# Patient Record
Sex: Female | Born: 1965
Health system: Southern US, Community
[De-identification: ages and names within clinical notes are randomized; demographics above are authoritative.]

## PROBLEM LIST (undated history)

## (undated) DIAGNOSIS — K219 Gastro-esophageal reflux disease without esophagitis: Secondary | ICD-10-CM

## (undated) DIAGNOSIS — E559 Vitamin D deficiency, unspecified: Secondary | ICD-10-CM

## (undated) DIAGNOSIS — G4730 Sleep apnea, unspecified: Secondary | ICD-10-CM

## (undated) DIAGNOSIS — R011 Cardiac murmur, unspecified: Secondary | ICD-10-CM

## (undated) DIAGNOSIS — I38 Endocarditis, valve unspecified: Secondary | ICD-10-CM

## (undated) DIAGNOSIS — K5792 Diverticulitis of intestine, part unspecified, without perforation or abscess without bleeding: Secondary | ICD-10-CM

## (undated) DIAGNOSIS — I10 Essential (primary) hypertension: Secondary | ICD-10-CM

## (undated) DIAGNOSIS — T7840XA Allergy, unspecified, initial encounter: Secondary | ICD-10-CM

## (undated) HISTORY — PX: ABDOMINAL HYSTERECTOMY: SHX81

## (undated) HISTORY — DX: Vitamin D deficiency, unspecified: E55.9

## (undated) HISTORY — DX: Essential (primary) hypertension: I10

## (undated) HISTORY — DX: Cardiac murmur, unspecified: R01.1

## (undated) HISTORY — DX: Sleep apnea, unspecified: G47.30

## (undated) HISTORY — DX: Diverticulitis of intestine, part unspecified, without perforation or abscess without bleeding: K57.92

## (undated) HISTORY — PX: CHOLECYSTECTOMY, LAPAROSCOPIC: SHX56

## (undated) HISTORY — PX: TUBAL LIGATION: SHX77

## (undated) HISTORY — DX: Endocarditis, valve unspecified: I38

## (undated) HISTORY — DX: Gastro-esophageal reflux disease without esophagitis: K21.9

## (undated) HISTORY — PX: CHOLECYSTECTOMY: SHX55

## (undated) HISTORY — DX: Allergy, unspecified, initial encounter: T78.40XA

---

## 1991-03-30 DIAGNOSIS — K219 Gastro-esophageal reflux disease without esophagitis: Secondary | ICD-10-CM

## 1991-03-30 DIAGNOSIS — I10 Essential (primary) hypertension: Secondary | ICD-10-CM

## 1991-03-30 HISTORY — DX: Essential (primary) hypertension: I10

## 1991-03-30 HISTORY — DX: Gastro-esophageal reflux disease without esophagitis: K21.9

## 1997-12-17 ENCOUNTER — Emergency Department (HOSPITAL_COMMUNITY): Admission: EM | Admit: 1997-12-17 | Discharge: 1997-12-17 | Payer: Self-pay | Admitting: Emergency Medicine

## 2002-10-04 ENCOUNTER — Encounter: Admission: RE | Admit: 2002-10-04 | Discharge: 2002-10-04 | Payer: Self-pay | Admitting: Family Medicine

## 2002-10-04 ENCOUNTER — Encounter: Payer: Self-pay | Admitting: Family Medicine

## 2002-12-06 ENCOUNTER — Ambulatory Visit (HOSPITAL_COMMUNITY): Admission: RE | Admit: 2002-12-06 | Discharge: 2002-12-06 | Payer: Self-pay | Admitting: Gynecology

## 2002-12-06 ENCOUNTER — Ambulatory Visit (HOSPITAL_BASED_OUTPATIENT_CLINIC_OR_DEPARTMENT_OTHER): Admission: RE | Admit: 2002-12-06 | Discharge: 2002-12-06 | Payer: Self-pay | Admitting: Gynecology

## 2002-12-06 ENCOUNTER — Encounter (INDEPENDENT_AMBULATORY_CARE_PROVIDER_SITE_OTHER): Payer: Self-pay

## 2002-12-21 ENCOUNTER — Encounter (INDEPENDENT_AMBULATORY_CARE_PROVIDER_SITE_OTHER): Payer: Self-pay | Admitting: Specialist

## 2002-12-21 ENCOUNTER — Ambulatory Visit (HOSPITAL_COMMUNITY): Admission: RE | Admit: 2002-12-21 | Discharge: 2002-12-21 | Payer: Self-pay | Admitting: Gastroenterology

## 2003-06-26 ENCOUNTER — Other Ambulatory Visit: Admission: RE | Admit: 2003-06-26 | Discharge: 2003-06-26 | Payer: Self-pay | Admitting: Gynecology

## 2003-07-10 ENCOUNTER — Encounter (INDEPENDENT_AMBULATORY_CARE_PROVIDER_SITE_OTHER): Payer: Self-pay | Admitting: Specialist

## 2003-07-10 ENCOUNTER — Observation Stay (HOSPITAL_COMMUNITY): Admission: RE | Admit: 2003-07-10 | Discharge: 2003-07-11 | Payer: Self-pay | Admitting: Gynecology

## 2003-09-06 ENCOUNTER — Encounter: Admission: RE | Admit: 2003-09-06 | Discharge: 2003-09-06 | Payer: Self-pay | Admitting: Family Medicine

## 2004-08-17 ENCOUNTER — Other Ambulatory Visit: Admission: RE | Admit: 2004-08-17 | Discharge: 2004-08-17 | Payer: Self-pay | Admitting: Gynecology

## 2004-11-13 ENCOUNTER — Encounter: Admission: RE | Admit: 2004-11-13 | Discharge: 2004-11-13 | Payer: Self-pay | Admitting: Family Medicine

## 2005-08-18 ENCOUNTER — Other Ambulatory Visit: Admission: RE | Admit: 2005-08-18 | Discharge: 2005-08-18 | Payer: Self-pay | Admitting: Gynecology

## 2005-11-06 ENCOUNTER — Inpatient Hospital Stay (HOSPITAL_COMMUNITY): Admission: EM | Admit: 2005-11-06 | Discharge: 2005-11-07 | Payer: Self-pay | Admitting: Emergency Medicine

## 2005-11-07 ENCOUNTER — Ambulatory Visit: Payer: Self-pay | Admitting: Psychiatry

## 2005-11-15 ENCOUNTER — Encounter: Admission: RE | Admit: 2005-11-15 | Discharge: 2005-11-15 | Payer: Self-pay | Admitting: Family Medicine

## 2006-08-25 ENCOUNTER — Other Ambulatory Visit: Admission: RE | Admit: 2006-08-25 | Discharge: 2006-08-25 | Payer: Self-pay | Admitting: *Deleted

## 2006-11-17 ENCOUNTER — Encounter: Admission: RE | Admit: 2006-11-17 | Discharge: 2006-11-17 | Payer: Self-pay | Admitting: Family Medicine

## 2009-03-29 DIAGNOSIS — E559 Vitamin D deficiency, unspecified: Secondary | ICD-10-CM

## 2009-03-29 HISTORY — DX: Vitamin D deficiency, unspecified: E55.9

## 2009-04-22 ENCOUNTER — Emergency Department (HOSPITAL_COMMUNITY): Admission: EM | Admit: 2009-04-22 | Discharge: 2009-04-22 | Payer: Self-pay | Admitting: Family Medicine

## 2009-10-21 ENCOUNTER — Encounter: Admission: RE | Admit: 2009-10-21 | Discharge: 2009-10-21 | Payer: Self-pay | Admitting: Family Medicine

## 2010-06-15 LAB — POCT RAPID STREP A (OFFICE): Streptococcus, Group A Screen (Direct): NEGATIVE

## 2010-08-14 NOTE — H&P (Signed)
NAME:  Amanda [name], Amanda [name] NO.:  [id number]   MEDICAL RECORD NO.:  [id number]          PATIENT TYPE:  EMS   LOCATION:  ED                           FACILITY:  [facility]   PHYSICIAN:  Hollice Espy, M.D.DATE OF BIRTH:  01-01-1966   DATE OF ADMISSION:  11/06/2005  DATE OF DISCHARGE:                                HISTORY & PHYSICAL   PRIMARY CARE PHYSICIAN:  She sees a Publishing rights manager at [place] but cannot  recall who the PCP is.   CHIEF COMPLAINT:  Overdose.   HISTORY OF PRESENT ILLNESS:  The patient is a 45 year old African-American  female with past medical history of hypertension and previous diagnosis of  depression but had never been on medicines for depression, who presents to  the emergency room after intentional overdose.  Apparently, things have been  stressful for the past 1-2 years ever since her husband committed suicide 2  years ago.  Since that time, she had noted especially in the last few months  things have really stressful, and her boyfriend and her have been getting  into more and more fights, and her 2 sons have been hanging with friends who  abuse drugs.  She finally could not take the stress anymore and then today  took an intentional overdose of her blood pressure medications, specifically  the patient took 15 Avapro and about 15-20 hydrochlorothiazides.  She called  a friend about an hour later and told her what had happened.  Her friend  rushed over, called the paramedics, and the patient was brought in to the  emergency room.  She says she took this at approximately 1 p.m. or so.  She  was given a charcoal suspension at approximately 5 p.m.  Poison Control was  called, and they recommended close monitoring of her vital signs but  otherwise, no other treatments were indicated.  The patient had lab work  drawn which showed normal white count.  Her urine drug screen alcohol level,  comprehensive metabolic panel are pending.  The patient denies  taking any  salicylate or Tylenol; however, none were ordered by the emergency room.  Currently, the patient is doing okay.  She is tearful about what happened  and stressed, but she denies any headache, vision changes, __________, chest  pain, palpitations, shortness of breath, wheeze, cough, abdominal pain,  hematuria, dysuria, constipation, diarrhea, focal extremity numbness,  weakness, or pain.   REVIEW OF SYSTEMS:  Otherwise negative.   PAST MEDICAL HISTORY:  1. Hypertension.  2. Previous diagnosis of depression.  3. She also has obesity.   MEDICATIONS:  1. Avapro 150 p.o. daily.  2. HCTZ 25 p.o. daily.   ALLERGIES:  None.   SOCIAL HISTORY:  She denies any tobacco, alcohol, or drug use.   FAMILY HISTORY:  Noncontributory other than a husband who killed himself 2  years ago.   PHYSICAL EXAMINATION:  VITAL SIGNS:  The patient's vitals on admission, temp  afebrile, heart rate 81, blood pressure 136/63, respirations 18, O2  saturations 99% on room air.  GENERAL:  The patient is alert and oriented x3.  No apparent distress.  HEENT:  Normocephalic, atraumatic.  Her mucous membranes are moist.  She has  no carotid bruits.  HEART:  Regular rate and rhythm, S1, S2.  LUNGS:  Clear to auscultation bilaterally.  ABDOMEN:  Soft, nontender, obese, positive bowel sounds.  EXTREMITIES:  No cyanosis, clubbing, or edema.   LABORATORY DATA:  White count 9.8, __________12.6, 36.9, MCV 91, platelet  count 314, 81% neutrophils.  Urine drug screen, comprehensive metabolic  panel, and alcohol level are pending at this time.  I have ordered myself a  Tylenol level and salicylate level.   ASSESSMENT/PLAN:  1. An intentional overdose of blood pressure medications.  If all other      lab work comes back normal, we will plan to keep the patient in the ICU      with continuous monitoring of her blood pressure closely.  I have also      contacted Behavioral Health for a psychiatric assessment.   2. Hypertension.  At this time, we will hold until medications have      cleared her system.  Once her blood pressure starts to elevate, we will      resume her medications.      Hollice Espy, M.D.  Electronically Signed     SKK/MEDQ  D:  11/06/2005  T:  11/06/2005  Job:  [id number]   cc:   Behavioral [facility]

## 2010-08-14 NOTE — Op Note (Signed)
NAME:  SEMAJA, LYMON                    ACCOUNT NO.:  [id number]   MEDICAL RECORD NO.:  [id number]                   PATIENT TYPE:  AMB   LOCATION:  NESC                                 FACILITY:  [facility]   PHYSICIAN:  Ivor Costa. Farrel Gobble, M.D.              DATE OF BIRTH:  Aug 29, 1965   DATE OF PROCEDURE:  12/06/2002  DATE OF DISCHARGE:                                 OPERATIVE REPORT   PREOPERATIVE DIAGNOSES:  1. Questionable endometrial polyp.  2. Menorrhagia.  3. Anemia.   POSTOPERATIVE DIAGNOSES:  1. Questionable endometrial polyp.  2. Menorrhagia.  3. Anemia.   PROCEDURE:  D&C/hysteroscopy.   SURGEON:  Ivor Costa. Farrel Gobble, M.D.   ANESTHESIA:  MAC with a paracervical block of 0.25% Marcaine.   INTAKE AND OUTPUT DEFICIT:  30% sorbitol solution was 30 mL.   ESTIMATED BLOOD LOSS:  Minimal.   FINDINGS:  Normal cavity contours. The uterus sounded to 9 cm.   PATHOLOGY:  Endometrial curettings.   DESCRIPTION OF PROCEDURE:  The patient was taken to the operating room,  placed in the dorsal lithotomy position, prepped and draped in the usual  sterile fashion after IV sedation was induced. A bivalve speculum was then  placed in the vagina, the cervix was visualized and 10 mL of 0.25% Marcaine  solution was injected circumferentially around the cervix after which the  speculum was removed and bimanual examination was performed. The orientation  of the uterus was confirmed. A sterile weighted speculum was then placed in  the vagina as was the ______.  The cervix was stabilized with a single tooth  tenaculum and at 3 and 9 o'clock were injected with a total of 10 mL of a  dilute Pitressin solution of 10 and 50. The uterine sound was placed, the  orientation of the canal was confirmed. The cervix was then gently dilated  up to 41 [place] with ease. The operative hysteroscope was advanced then  through the cervix. Inspection of the cavity showed normal secretory  appearing  endometrium. The questionable defect was on the posterior wall  therefore several passes were taken from this area. There was nothing that  felt firm and distinct consistent with a fibroid; however, the tubal ostia  were visualized and unremarkable. The hysteroscope was removed, curettage of  the entire cavity was then performed and sent for pathology. Replacement of  the hysteroscope confirmed that the areas of the previous biopsy were  hemostatic and again no polyp or fibroid  was revealed after curettage. The instruments were then removed from the  vagina, the cervix was noted to be hemostatic. The patient tolerated the  procedure well. Sponge, sharp and needle counts were correct x2 and she was  transferred to the PACU in stable condition.  Ivor Costa. Farrel Gobble, M.D.    Leda Roys  D:  12/06/2002  T:  12/06/2002  Job:  [id number]

## 2010-08-14 NOTE — H&P (Signed)
NAME:  Amanda [name], Amanda [name]                    ACCOUNT NO.:  [id number]   MEDICAL RECORD NO.:  [id number]                   PATIENT TYPE:  INP   LOCATION:  NA                                   FACILITY:  [facility]   PHYSICIAN:  Ivor Costa. Farrel Gobble, M.D.              DATE OF BIRTH:  07/20/1965   DATE OF ADMISSION:  07/10/2003  DATE OF DISCHARGE:                                HISTORY & PHYSICAL   HISTORY OF PRESENT ILLNESS:  The patient is a 45 year old G2, P2 with a  history of dysfunctional bleeding and as a result thereof severe anemia.  The patient underwent a GYN ultrasound which showed questionable submucosal  fibroid versus an endometrial polyp and as a result thereof underwent a D&C  hysteroscopy which came back with benign tissue.  The patient continued to  bleed.  We therefore went ahead and placed a Mirena IUD in December 2004.  The patient ultimately came back in February 2005 to have her IUD removed.  She states initially she had done well on her IUD however, her last cycle on  the IUD had lasted 12 days in which she had bled very heavily with a large  amount of clots and she felt that the IUD may have been coming out as well.  She therefore would like to proceed with more definitive surgery in the form  of a vaginal hysterectomy.  The patient states that prior to all of this her  periods have been increasingly heavy for the past several months. She had an  ultrasound done in July 2004 which showed her uterus to be 11.5 x 6.4 x 8.1.  She was noted to be severely anemic with a hemoglobin of 9 and iron studies  consistent with iron deficiency.  The patient denies any postcoital  bleeding.  Her Pap smears have all been normal.  From a past OB-GYN  perspective, she is contracepting with a tubal ligation, she has had two  vaginal deliveries complicated only by hypertension, her cycles are 28 days.   PAST MEDICAL HISTORY:  Past medical history is significant for chronic  hypertension and  a stomach ulcer.   SURGICAL HISTORY:  She had a D&C hysteroscopy in September 2004, she had a  cholecystectomy in 1992 and a tubal ligation in 1991.   MEDICATIONS:  The patient is on Avapro 150 mg a day and iron  supplementation.   SOCIAL HISTORY:  She is married.  No alcohol, tobacco, or caffeine.   ALLERGIES:  Negative.   FAMILY HISTORY:  Family history is significant for breast cancer in sister  in early 23s.  She has had two sisters both of which have had fibroids.  There is no family history of ovarian, uterine, or colon cancer.   PHYSICAL EXAMINATION:  She is well appearing in no acute distress.  Her  heart is regular rate.  Her lungs are clear to auscultation.  Her abdomen is  soft,  nontender, without rebound or guarding.  On speculum exam she has  normal external female genitalia, the BUS is negative, the vagina is pink  and moist, the cervix is parous.  Bimanual exam:  The uterus is anteverted,  mobile, and nontender.  The adnexa without tenderness or fullness.   LABORATORIES:  She had a TSH and prolactin both of which were normal.  She  had a hemoglobin of 9 with a hematocrit of 27 and normal platelets.  Iron  studies consistent with iron-deficiency anemia.   ASSESSMENT:  Dysfunctional bleeding failed conservative management.   PLAN:  Patient will present for a vaginal hysterectomy with preservation of  her ovaries which is scheduled for the afternoon of July 10, 2003.                                               Ivor Costa. Farrel Gobble, M.D.    THL/MEDQ  D:  07/09/2003  T:  07/09/2003  Job:  [id number]

## 2010-08-14 NOTE — Op Note (Signed)
   NAME:  Amanda [name], Amanda [name]                    ACCOUNT NO.:  [id number]   MEDICAL RECORD NO.:  [id number]                   PATIENT TYPE:  AMB   LOCATION:  ENDO                                 FACILITY:  [facility]   PHYSICIAN:  Graylin Shiver, M.D.                DATE OF BIRTH:  [birth year]   DATE OF PROCEDURE:  12/21/2002  DATE OF DISCHARGE:                                 OPERATIVE REPORT   PROCEDURE:  Esophagogastroduodenoscopy with biopsy.   INDICATIONS FOR PROCEDURE:  Heartburn, anemia, heme-positive stool, recent  finding of H. pylori which was treated.   Informed consent was obtained after explanation of the risks of bleeding,  infection, and perforation.   PREMEDICATIONS:  1. Fentanyl 75 mcg IV.  2. Versed 6 mg IV.   DESCRIPTION OF PROCEDURE:  With the patient in the left lateral decubitus  position, the Olympus gastroscope was inserted into the oropharynx and  passed into the esophagus.  It was advanced down the esophagus, then into  the stomach and into the duodenum.  The second portion and bulb of the  duodenum looked normal.  The stomach showed some mild erythema compatible  with gastritis.  No ulcers or erosions were seen.  No lesions were seen in  the fundus or cardia.  Biopsies were obtained from the distal and mid  stomach for histological inspection and to look for evidence of Helicobacter  pylori.  The esophagus looked normal.  The esophagogastric junction was at  35 cm.  She tolerated the procedure well without complications.   IMPRESSION:  Mild nonerosive gastritis.   PLAN:  The biopsies will be checked.  I see nothing specifically on this  examination to explain anemia or positive stool.                                               Graylin Shiver, M.D.    SFG/MEDQ  D:  12/21/2002  T:  12/22/2002  Job:  [id number]   cc:   Chales Salmon. Abigail Miyamoto, M.D.  [address]  [place]  [place] [address]  Fax: [phone]

## 2010-08-14 NOTE — Op Note (Signed)
NAME:  Amanda [name], Amanda [name]                    ACCOUNT NO.:  [id number]   MEDICAL RECORD NO.:  [id number]                   PATIENT TYPE:  OBV   LOCATION:  9399                                 FACILITY:  [facility]   PHYSICIAN:  Ivor Costa. Farrel Gobble, M.D.              DATE OF BIRTH:  05/10/1965   DATE OF PROCEDURE:  07/10/2003  DATE OF DISCHARGE:                                 OPERATIVE REPORT   PREOPERATIVE DIAGNOSES:  1. Dysfunctional uterine bleeding.  2. Anemia.   POSTOPERATIVE DIAGNOSES:  1. Dysfunctional uterine bleeding.  2. Anemia.   PROCEDURE:  Total vaginal hysterectomy.   SURGEON:  Ivor Costa. Farrel Gobble, M.D.   ASSISTANT:  Gaetano Hawthorne. Lily Peer, M.D.   ANESTHESIA:  General anesthesia.   IV FLUIDS:  1700 mL lactated Ringer's.   ESTIMATED BLOOD LOSS:  Less than 100 mL.   URINE OUTPUT:  Not available.   FINDINGS:  The uterus was globular, soft, consistent with adenomyosis.  The  weight was 261 g.  The tubes and ovaries were unremarkable.   COMPLICATIONS:  None.   PATHOLOGY:  Uterus and cervix.   DESCRIPTION OF PROCEDURE:  The patient was taken to the operating room where  general anesthesia was induced and placed in the dorsal lithotomy position,  prepped and draped in the usual sterile fashion with careful attention to  the flexion of the legs onto the anterior thighs.  She was in and out  catheterized at the beginning of the case.  A weighted speculum was placed  in the vagina as was the Sims.  The cervix was stabilized with a single-  tooth tenaculum and the vaginal mucosa was injected with a 2% lidocaine with  epinephrine.  The vaginal mucosa was then scored circumferentially.  The  cervix was deviated cephalad and the posterior colpotomy was performed.  The  incision was extended and the long sterile weighted speculum was then placed  in the posterior cul-de-sac.  the vaginal mucosa was sharply dissected off  anteriorly and laterally.  The uterosacrals were then  transected and suture  ligated with 0 Vicryl.  The cardinal ligaments were then transected and  suture ligated again with 0 Vicryl.  The dissection carried through until an  anterior colpotomy was able to be performed.  Placement of the retractors to  protect the bladder and the uterine vessels were then transected and suture  ligated.  The procedure was carried through up until we were able to safely  deliver the fundus.  The uterine fundus needed to be morcellated.  Portions  of the fundus and cervix were then sharply dissected off in order to give  her better visualization of the round and tubo-ovarian ligaments.  These  were then transected bilaterally and the remainder of the specimen was  passed off the field.  Those pedicles were then treated with 0 Vicryl and  the Heaney stitch followed by a free tie and were also held for  later  identification.  The lap sponge was placed in the vagina.  The pelvis was  irrigated with copious amounts of saline.  Inspection of pedicles assures  hemostasis.  There was a small amount of bleeding on the posterior cuff.  It  was felt to be safely obtained with closure of the peritoneum posteriorly.  The posterior vagina was plicated to the peritoneum from uterosacral to  uterosacral.  Reinspection of the area of bleeding assures of hemostasis.  The reinspection of the upper pedicles assured [place] also of hemostasis.  These  were then cut.  The McCall's culdoplasty stitch was then placed with 2-0  Vicryl.  The anterior/posterior vagina was plicated in the midline.  The  posterior colpotomy stitch was then tied.  The sutures were cut.  The vagina  was irrigated.  It was noted to be hemostatic.  The Foley was placed at the  end of the procedure which showed copious amounts of clear urine.                                               Ivor Costa. Farrel Gobble, M.D.    THL/MEDQ  D:  07/10/2003  T:  07/11/2003  Job:  [id number]

## 2010-08-14 NOTE — Op Note (Signed)
   NAME:  Amanda [name], Amanda [name]                    ACCOUNT NO.:  [id number]   MEDICAL RECORD NO.:  [id number]                   PATIENT TYPE:  AMB   LOCATION:  ENDO                                 FACILITY:  [facility]   PHYSICIAN:  Graylin Shiver, M.D.                DATE OF BIRTH:  1965-12-31   DATE OF PROCEDURE:  12/21/2002  DATE OF DISCHARGE:                                 OPERATIVE REPORT   PROCEDURE:  Colonoscopy.   INDICATION FOR PROCEDURE:  Heme-positive stool, anemia.   Informed consent was obtained after explanation of the risks of bleeding,  infection, and perforation.   PREMEDICATIONS:  The procedure was done immediately after an EGD with an  additional 25 mcg of fentanyl and 2 mg of Versed given IV.   DESCRIPTION OF PROCEDURE:  With the patient in the left lateral decubitus  position, a rectal exam was performed, and no masses were felt.  The Olympus  colonoscope was inserted into the rectum and advanced around the colon to  the cecum.  The cecal landmarks were identified.  The cecum and ascending  colon were normal.  The transverse colon was normal.  The descending colon,  sigmoid, and rectum were normal.  She tolerated the procedure well without  complications.   IMPRESSION:  Normal colonoscopy to the cecum.   There is nothing on this exam to explain heme-positive stool or anemia.   The patient will follow up with her primary care physician, Chales Salmon.  Abigail Miyamoto, M.D.                                               Graylin Shiver, M.D.    SFG/MEDQ  D:  12/21/2002  T:  12/22/2002  Job:  [id number]   cc:   Chales Salmon. Abigail Miyamoto, M.D.  [address]  [place]  [place] [address]  Fax: [phone]

## 2010-08-14 NOTE — H&P (Signed)
NAME:  Amanda [name], COURTER                    ACCOUNT NO.:  [id number]   MEDICAL RECORD NO.:  [id number]                   PATIENT TYPE:  AMB   LOCATION:  NESC                                 FACILITY:  [facility]   PHYSICIAN:  Ivor Costa. Farrel Gobble, M.D.              DATE OF BIRTH:  [birth year]   DATE OF ADMISSION:  DATE OF DISCHARGE:                                HISTORY & PHYSICAL   PREOPERATIVE HISTORY AND PHYSICAL:   CHIEF COMPLAINT:  Dysfunctional uterine bleeding with questionable  endometrial polyp.   HISTORY OF PRESENT ILLNESS:  The patient is a 45 year old G2, P2,  contracepting with a tubal ligation with about a 65-month history of  menorrhagia.  The patient states she currently flows now 7-8 sometimes even  9 days of the month.  She has noticed increasing amount of clots and  increasing duration of flow.  The patient is without any postcoital  bleeding.  Her Pap smears have been normal.  The patient had an ultrasound  performed that showed her uterus to be uniformly enlarged at 12.2 x 5.6 x  7.7 cm without any distinct fibroids.  A sonohistogram was performed which  showed an echogenic focus in the posterior wall that measures 10 x 7 which  could be questionably a submucous myoma versus an endometrial polyp.  The  patient elects to undergo a D&C hysteroscopy.   PAST OBSTETRICAL-GYNECOLOGICAL HISTORY:  Significant for menses as above  contracepting with a tubal ligation, she has had two vaginal deliveries  complicated only by hypertension, her cycles are every 28 days, her Pap  smear was normal.   PAST MEDICAL HISTORY:  Significant for chronic hypertension and stomach  ulcer.   PAST SURGICAL HISTORY:  Cholecystectomy in 1992 and a tubal in 1991.   MEDICATIONS:  The patient is on Avapro 150 mg a day as well as iron for  unexplained anemia.  The patient has had an extensive evaluation including  colonoscopy all of which has been negative.   SOCIAL HISTORY:  She is married;  no alcohol, tobacco, or caffeine.   ALLERGIES:  Negative.   FAMILY HISTORY:  Significant for breast cancer in a sister in her early 52s,  two sisters both have had fibroids.  There is no history of ovarian,  uterine, or colon cancer.  There is a family history for heart disease and  hypertension.   PHYSICAL EXAMINATION:  GENERAL:  She is a well-appearing female in no acute  distress.  HEART:  Her heart is regular rate.  LUNGS:  Her lungs are clear to auscultation.  ABDOMEN:  Her abdomen is soft without rebound or guarding.  GYN:  She has normal female genitalia.  The BUS is negative.  The vagina is  pink and moist.  Cervix is parous.  The uterus is uniformly enlarged.  The  adnexa are negative.  Rectovaginal exam is confirmatory.   ASSESSMENT:  Dysfunctional bleeding with iron-deficient anemia etiology  unclear at this point.   PLAN:  Dilation and curettage hysteroscopy.  All questions were addressed.  The patient will present for surgery as mentioned above.  Of note, her  hemoglobin was 8.9 with hematocrit of 28.  On November 22, 2002, her iron was  13 and her saturation was 3%.                                               Ivor Costa. Farrel Gobble, M.D.    Leda Roys  D:  12/05/2002  T:  12/05/2002  Job:  [id number]

## 2010-08-14 NOTE — Discharge Summary (Signed)
NAME:  Amanda [name], Amanda [name]                    ACCOUNT NO.:  [id number]   MEDICAL RECORD NO.:  [id number]                   PATIENT TYPE:  OBV   LOCATION:  9308                                 FACILITY:  [facility]   PHYSICIAN:  Ivor Costa. Farrel Gobble, M.D.              DATE OF BIRTH:  1965/10/03   DATE OF ADMISSION:  07/10/2003  DATE OF DISCHARGE:  07/11/2003                                 DISCHARGE SUMMARY   PRINCIPAL PROCEDURE:  Total vaginal hysterectomy.   PRINCIPAL DIAGNOSIS:  Dysfunction uterine bleeding and uterine fibroids.   Please refer to the dictated H&P.   HOSPITAL COURSE:  The patient was admitted on the afternoon of July 10, 2003 and underwent a TVH under general anesthesia with an estimated blood  loss of less than 100.  Operative findings showed a globular soft uterus,  consistent with adenomyosis, and the intraoperative weight was 261 gm.  Tubes and ovaries were normal.  The patient was extubated in the OR,  transferred to the PACU in stable condition, and then up to the GYN floor in  due fashion.  Her postoperative course was uncomplicated.  By the morning of  July 11, 2003, the patient reported only minimal vaginal bleeding.  She was  voiding without any difficulty.  She had no nausea or vomiting, and was  ambulating again without any difficulty.  Her pain was well controlled with  oral pain medications.  The patient was later discharged home on that day.  Because of her chronic hypertension, the patient was to be seen in the  office the following week.  Her blood pressure medication was held at the  time of discharge.  The patient was discharged home with the following  medications.   DISCHARGE MEDICATIONS:  1. Over-the-counter Motrin.  2. She had been given a prescription for Lortab preoperatively.   ACTIVITY:  Restricted activities at home, with no heavy lifting and pelvis  rest.   POSTOPERATIVE LABORATORIES:  Her hemoglobin was 10.1, hematocrit 32.4,  platelets  271, and her white count was 10.                                               Ivor Costa. Farrel Gobble, M.D.    Leda Roys  D:  08/02/2003  T:  08/03/2003  Job:  [id number]

## 2010-10-19 ENCOUNTER — Ambulatory Visit: Payer: Self-pay | Admitting: Internal Medicine

## 2012-02-07 ENCOUNTER — Other Ambulatory Visit: Payer: Self-pay | Admitting: Family Medicine

## 2012-02-08 ENCOUNTER — Other Ambulatory Visit: Payer: Self-pay | Admitting: Family Medicine

## 2012-03-08 ENCOUNTER — Encounter: Payer: Self-pay | Admitting: Family Medicine

## 2012-03-08 ENCOUNTER — Ambulatory Visit (INDEPENDENT_AMBULATORY_CARE_PROVIDER_SITE_OTHER): Payer: BC Managed Care – PPO | Admitting: Family Medicine

## 2012-03-08 VITALS — BP 136/76 | HR 99 | Temp 98.7°F | Ht 63.0 in | Wt 232.5 lb

## 2012-03-08 DIAGNOSIS — I10 Essential (primary) hypertension: Secondary | ICD-10-CM | POA: Insufficient documentation

## 2012-03-08 DIAGNOSIS — K219 Gastro-esophageal reflux disease without esophagitis: Secondary | ICD-10-CM

## 2012-03-08 MED ORDER — LOSARTAN POTASSIUM 50 MG PO TABS: 50.0000 mg | ORAL_TABLET | Freq: Every day | ORAL | Status: DC

## 2012-03-08 NOTE — Assessment & Plan Note (Signed)
Patient complains of cough x 25 years, worsened after starting Lisinopril.  Will discontinue ACEi and replace with Cozaar for now.  See if this reduces cough.  Repeat BP check in 4 weeks.  Gave patient handout on DASH diet and discussed some ways cook without using salt.  She will probably be a good candidate for Nutrition clinic in the near future.  RTC in 4 weeks.  Red flags reviewed.

## 2012-03-08 NOTE — Patient Instructions (Addendum)
It was nice to meet you today, Amanda [name]. Let's try stopping Lisinopril and switching to Cozaar to reduce cough.  Try to cut back on salt intake and increase physical activity.  See below regarding DASH diet. Schedule follow up appointment with me in 4 weeks to recheck BP. If you develop headache, numbness/tingling of extremities, changes in vision, or weakness on one side of your face or body, please call your doctor.  DASH Diet The DASH diet stands for "Dietary Approaches to Stop Hypertension." It is a healthy eating plan that has been shown to reduce high blood pressure (hypertension) in as little as 14 days, while also possibly providing other significant health benefits. These other health benefits include reducing the risk of breast cancer after menopause and reducing the risk of type 2 diabetes, heart disease, colon cancer, and stroke. Health benefits also include weight loss and slowing kidney failure in patients with chronic kidney disease.  DIET GUIDELINES  Limit salt (sodium). Your diet should contain less than 1500 mg of sodium daily.  Limit refined or processed carbohydrates. Your diet should include mostly whole grains. Desserts and added sugars should be used sparingly.  Include small amounts of heart-healthy fats. These types of fats include nuts, oils, and tub margarine. Limit saturated and trans fats. These fats have been shown to be harmful in the body. CHOOSING FOODS  The following food groups are based on a 2000 calorie diet. See your Registered Dietitian for individual calorie needs. Grains and Grain Products (6 to 8 servings daily)  Eat More Often: Whole-wheat bread, brown rice, whole-grain or wheat pasta, quinoa, popcorn without added fat or salt (air popped).  Eat Less Often: White bread, white pasta, white rice, cornbread. Vegetables (4 to 5 servings daily)  Eat More Often: Fresh, frozen, and canned vegetables. Vegetables may be raw, steamed, roasted, or grilled with  a minimal amount of fat.  Eat Less Often/Avoid: Creamed or fried vegetables. Vegetables in a cheese sauce. Fruit (4 to 5 servings daily)  Eat More Often: All fresh, canned (in natural juice), or frozen fruits. Dried fruits without added sugar. One hundred percent fruit juice ( cup [237 mL] daily).  Eat Less Often: Dried fruits with added sugar. Canned fruit in light or heavy syrup. [employer], Fish, and Poultry (2 servings or less daily. One serving is 3 to 4 oz [85-114 g]).  Eat More Often: Ninety percent or leaner ground beef, tenderloin, sirloin. Round cuts of beef, chicken breast, [place] breast. All fish. Grill, bake, or broil your meat. Nothing should be fried.  Eat Less Often/Avoid: Fatty cuts of meat, [place], or chicken leg, thigh, or wing. Fried cuts of meat or fish. Dairy (2 to 3 servings)  Eat More Often: Low-fat or fat-free milk, low-fat plain or light yogurt, reduced-fat or part-skim cheese.  Eat Less Often/Avoid: Milk (whole, 2%).Whole milk yogurt. Full-fat cheeses. Nuts, Seeds, and Legumes (4 to 5 servings per week)  Eat More Often: All without added salt.  Eat Less Often/Avoid: Salted nuts and seeds, canned beans with added salt. Fats and Sweets (limited)  Eat More Often: Vegetable oils, tub margarines without trans fats, sugar-free gelatin. Mayonnaise and salad dressings.  Eat Less Often/Avoid: Coconut oils, palm oils, butter, stick margarine, cream, half and half, cookies, candy, pie. FOR MORE INFORMATION The Dash Diet Eating Plan: www.dashdiet.org Document Released: 03/04/2011 Document Revised: 06/07/2011 Document Reviewed: 03/04/2011 [facility] Patient Information 2013 [place], [place].

## 2012-03-08 NOTE — Progress Notes (Signed)
Subjective:     Patient ID: Amanda [name], female   DOB: 06-Jun-1965, 46 y.o.   MRN: [id number]  HPI She is here to establish care.   She used to go to [facility] but this was too far away.    Hypertension:  She was diagnosed with hypertension in 1993.  She has been taking medication since then. She is currently on Lisinopril and BP is well controlled. She does complain of a cough that affects her when she wakes up.  Previous MD thought it could be due to allergies, but no medications have helped relieve cough.  Patient admits to not exercising at all, but does try to adhere to a low sodium diet.  She denies any changes in vision, headache, paresthesias, nausea.  Does not need refills at this time.  I have reviewed PMH, FH, SH, Surgical Hx, Medications, Allergies, and Problem List.  Review of Systems  Within normal limits other than noted above     Objective:   Physical Exam General: NAD Neuro: Oriented x 3, alert  Cardio: S1S2, RRR, no murmurs  Resp: CTA, no rhonchi, rales or wheezes  Extremities: No edema    Assessment:     Hypertension     Plan:     See Problem List.

## 2012-04-05 ENCOUNTER — Encounter: Payer: Self-pay | Admitting: Family Medicine

## 2012-04-05 ENCOUNTER — Ambulatory Visit (INDEPENDENT_AMBULATORY_CARE_PROVIDER_SITE_OTHER): Payer: BC Managed Care – PPO | Admitting: Family Medicine

## 2012-04-05 VITALS — BP 137/72 | HR 88 | Temp 98.3°F | Ht 63.0 in | Wt 232.5 lb

## 2012-04-05 DIAGNOSIS — R053 Chronic cough: Secondary | ICD-10-CM | POA: Insufficient documentation

## 2012-04-05 DIAGNOSIS — R05 Cough: Secondary | ICD-10-CM | POA: Insufficient documentation

## 2012-04-05 DIAGNOSIS — K219 Gastro-esophageal reflux disease without esophagitis: Secondary | ICD-10-CM

## 2012-04-05 DIAGNOSIS — R059 Cough, unspecified: Secondary | ICD-10-CM

## 2012-04-05 MED ORDER — CETIRIZINE HCL 10 MG PO TABS: 10.0000 mg | ORAL_TABLET | Freq: Every day | ORAL | Status: DC

## 2012-04-05 MED ORDER — FAMOTIDINE 20 MG PO TABS: 20.0000 mg | ORAL_TABLET | Freq: Two times a day (BID) | ORAL | Status: DC

## 2012-04-05 MED ORDER — FLUTICASONE PROPIONATE 50 MCG/ACT NA SUSP: 2.0000 | Freq: Every day | NASAL | Status: DC

## 2012-04-05 NOTE — Progress Notes (Signed)
  Subjective:    Patient ID: Amanda [name], female    DOB: 1965-04-26, 47 y.o.   MRN: [id number]  HPI  Patient here for repeat BP check after stopping Lisinopril and starting Cozaar.  Patient says cough is still present.  Cough has been going on for several years.  Cough is both dry and productive at times.  Cough is worse in AM but also occurs when she lays down at bedtime.  She is constantly clearing her throat, phlegm is thick and clear.  Cough is associated with runny nose and watery eyes B/L, but she has tried allergy medication (multiple ones) for possible allergic rhinitis, with no relief.  She also has a hx of GERD and been taking Prilosec for several years.  Endorses chest discomfort at night, reflux after eating meals.  Patient has never smoked.  Family hx: grandfather and aunt both had lung cancer, both smoked tobacco.  Denies any bloody sputum, fever, chills, NS, weight loss.  No difficulty swallowing.  Never smoker.  Review of Systems  Per HPI    Objective:   Physical Exam  Constitutional: She appears well-nourished. No distress.  HENT:  Mouth/Throat: Oropharynx is clear and moist. No oropharyngeal exudate.  Neck: Neck supple.  Cardiovascular: Normal rate, regular rhythm and normal heart sounds.   Pulmonary/Chest: Effort normal and breath sounds normal. She has no wheezes. She has no rales.  Lymphadenopathy:    She has no cervical adenopathy.      Assessment & Plan:

## 2012-04-05 NOTE — Assessment & Plan Note (Signed)
GERD could be cause of chronic cough.  See notes on chronic cough.

## 2012-04-05 NOTE — Assessment & Plan Note (Signed)
Chronic cough did not improve with discontinuation of Lisinopril.  Differential Dx: allergic rhinitis, post-nasal drip, GERD, asthma, cancer.  No recent weight loss, fever, chills, or fatigue. - For chronic cough, please start Flonase and Zyrtec for possible post-nasal drip/allergic rhinitis - Stop Omeprazole and start PEPCID twice per day for reflux - Follow up appointment with me in 4-6 weeks to see if cough is any better - If no improvement, I will consider imaging and maybe a referral to a specialist

## 2012-04-05 NOTE — Patient Instructions (Addendum)
It was nice to see you again.  Your blood pressure is great today. For chronic cough, please start Flonase and Zyrtec to see if this helps with the phlegm/congestion. Stop Omeprazole and start PEPCID twice per day for reflux. Schedule follow up appointment with me in 4-6 weeks to see if cough is any better. If no improvement, I will consider imaging and maybe a referral to a specialist.

## 2012-05-08 ENCOUNTER — Other Ambulatory Visit: Payer: Self-pay | Admitting: *Deleted

## 2012-05-15 ENCOUNTER — Other Ambulatory Visit: Payer: Self-pay | Admitting: *Deleted

## 2012-05-15 MED ORDER — LOSARTAN POTASSIUM 50 MG PO TABS: 50.0000 mg | ORAL_TABLET | Freq: Every day | ORAL | Status: DC

## 2012-05-17 ENCOUNTER — Ambulatory Visit (INDEPENDENT_AMBULATORY_CARE_PROVIDER_SITE_OTHER): Payer: BC Managed Care – PPO | Admitting: Family Medicine

## 2012-05-17 ENCOUNTER — Encounter: Payer: Self-pay | Admitting: Family Medicine

## 2012-05-17 VITALS — BP 142/82 | HR 95 | Temp 98.3°F | Ht 63.0 in | Wt 234.6 lb

## 2012-05-17 DIAGNOSIS — R05 Cough: Secondary | ICD-10-CM

## 2012-05-17 DIAGNOSIS — R748 Abnormal levels of other serum enzymes: Secondary | ICD-10-CM

## 2012-05-17 DIAGNOSIS — B351 Tinea unguium: Secondary | ICD-10-CM

## 2012-05-17 DIAGNOSIS — R7401 Elevation of levels of liver transaminase levels: Secondary | ICD-10-CM

## 2012-05-17 DIAGNOSIS — R7402 Elevation of levels of lactic acid dehydrogenase (LDH): Secondary | ICD-10-CM

## 2012-05-17 DIAGNOSIS — R059 Cough, unspecified: Secondary | ICD-10-CM

## 2012-05-17 DIAGNOSIS — R053 Chronic cough: Secondary | ICD-10-CM

## 2012-05-17 LAB — COMPREHENSIVE METABOLIC PANEL
ALT: 24 U/L (ref 0–35)
AST: 23 U/L (ref 0–37)
Alkaline Phosphatase: 51 U/L (ref 39–117)
BUN: 11 mg/dL (ref 6–23)
Chloride: 106 mEq/L (ref 96–112)
Creat: 0.89 mg/dL (ref 0.50–1.10)
Total Bilirubin: 0.3 mg/dL (ref 0.3–1.2)

## 2012-05-17 MED ORDER — FEXOFENADINE HCL 180 MG PO TABS: 180.0000 mg | ORAL_TABLET | Freq: Every day | ORAL | Status: DC

## 2012-05-17 MED ORDER — LOSARTAN POTASSIUM 50 MG PO TABS: 50.0000 mg | ORAL_TABLET | Freq: Every day | ORAL | Status: DC

## 2012-05-17 MED ORDER — TERBINAFINE HCL 250 MG PO TABS: 250.0000 mg | ORAL_TABLET | Freq: Every day | ORAL | Status: DC

## 2012-05-17 MED ORDER — BENZONATATE 100 MG PO CAPS: 100.0000 mg | ORAL_CAPSULE | Freq: Three times a day (TID) | ORAL | Status: DC | PRN

## 2012-05-17 NOTE — Patient Instructions (Signed)
Please pick up your prescriptions at your pharmacy. Take Lamisil one tablet daily x 12 weeks. Return to clinic in 6 weeks for lab work and follow up.  Infected Ingrown Toenail An infected ingrown toenail occurs when the nail edge grows into the skin and bacteria invade the area. Symptoms include pain, tenderness, swelling, and pus drainage from the edge of the nail. Poorly fitting shoes, minor injuries, and improper cutting of the toenail may also contribute to the problem. You should cut your toenails squarely instead of rounding the edges. Do not cut them too short. Avoid tight or pointed toe shoes. Sometimes the ingrown portion of the nail must be removed. If your toenail is removed, it can take 3-4 months for it to re-grow. HOME CARE INSTRUCTIONS   Soak your infected toe in warm water for 20-30 minutes, 2 to 3 times a day.  Packing or dressings applied to the area should be changed daily.  Take medicine as directed and finish them.  Reduce activities and keep your foot elevated when able to reduce swelling and discomfort. Do this until the infection gets better.  Wear sandals or go barefoot as much as possible while the infected area is sensitive.  See your caregiver for follow-up care in 2-3 days if the infection is not better. SEEK MEDICAL CARE IF:  Your toe is becoming more red, swollen or painful. MAKE SURE YOU:   Understand these instructions.  Will watch your condition.  Will get help right away if you are not doing well or get worse. Document Released: 04/22/2004 Document Revised: 06/07/2011 Document Reviewed: 03/11/2008 [facility] Patient Information 2013 [place], [place].

## 2012-05-17 NOTE — Progress Notes (Signed)
  Subjective:    Patient ID: Amanda [name], female    DOB: 29-Nov-1965, 47 y.o.   MRN: [id number]  HPI  Chronic cough:  Patient has had chronic cough since childhood.  Never diagnosed with asthma, but family members have asthma.  She has been taking Flonase and Zyrtec for cough but does not seem to be helping.  At times (twice in the last 2 months), she coughed so hard she coughed up bloody sputum.  She works at [employer] and has had negative PPD at work.  She denies any associated fever, chills, NS, vomiting/nausea, or weight loss.  Denies any CP or SOB.  Cough is constant throughout day, but worse at night.  Never smoker.  Foot fungus:  Patient complains of RT great toenail infection.  Tip of toenail drains pus intermittently.  Patient denies any toe pain at this time.  She would like to try oral Lamisil at this time.  She is not interested in removal of toenail.  Review of Systems Per HPI    Objective:   Physical Exam  Constitutional: She appears well-nourished. No distress.  HENT:  Mouth/Throat: Oropharynx is clear and moist.  Cardiovascular: Normal rate.   Pulmonary/Chest: Effort normal. She has no wheezes. She has no rales.  Skin:  RT toenail: very coarse, yellow; no pus drainage at this time, no signs of paronychia; mild TTP at tip of toenail      Assessment & Plan:

## 2012-05-18 ENCOUNTER — Encounter: Payer: Self-pay | Admitting: Family Medicine

## 2012-05-18 DIAGNOSIS — B351 Tinea unguium: Secondary | ICD-10-CM | POA: Insufficient documentation

## 2012-05-18 NOTE — Assessment & Plan Note (Addendum)
Patient has had chronic cough for several years.  She has been using Zyrtec without relief.  Cough did not improve with Pepcid and has failed PPI in the past.  No recent weight loss, fever, chills, or SOB. - Will change to Allegra daily - Will treat symptoms with Tessalon Perles or Delsym - Consider Albuterol inhaler at next visit - Follow up as needed if symptoms worsen - May consider Pulmonology referral if no improvement

## 2012-05-18 NOTE — Assessment & Plan Note (Signed)
Patient prefers to start Lamisil PO daily x 12 weeks.  Will get baseline LFT today and repeat in 6 weeks.   If no improvement, may consider toenail removal but patient would like to keep toenail if possible.

## 2012-06-29 ENCOUNTER — Other Ambulatory Visit: Payer: BC Managed Care – PPO

## 2012-06-29 DIAGNOSIS — B351 Tinea unguium: Secondary | ICD-10-CM

## 2012-06-29 LAB — HEPATIC FUNCTION PANEL
ALT: 30 U/L (ref 0–35)
AST: 26 U/L (ref 0–37)
Albumin: 4.2 g/dL (ref 3.5–5.2)
Alkaline Phosphatase: 53 U/L (ref 39–117)
Indirect Bilirubin: 0.3 mg/dL (ref 0.0–0.9)
Total Protein: 6.7 g/dL (ref 6.0–8.3)

## 2012-06-29 NOTE — Progress Notes (Signed)
HEPATIC FUNCTION PROFILE DONE TODAY Uchenna Rappaport

## 2012-07-11 ENCOUNTER — Other Ambulatory Visit: Payer: Self-pay | Admitting: Ophthalmology

## 2012-08-04 ENCOUNTER — Other Ambulatory Visit: Payer: Self-pay

## 2012-08-04 DIAGNOSIS — Z1231 Encounter for screening mammogram for malignant neoplasm of breast: Secondary | ICD-10-CM

## 2012-08-11 ENCOUNTER — Encounter: Payer: Self-pay | Admitting: Family Medicine

## 2012-08-11 ENCOUNTER — Ambulatory Visit (INDEPENDENT_AMBULATORY_CARE_PROVIDER_SITE_OTHER): Payer: BC Managed Care – PPO | Admitting: Family Medicine

## 2012-08-11 VITALS — BP 140/73 | HR 100 | Temp 99.3°F | Ht 63.0 in | Wt 231.0 lb

## 2012-08-11 DIAGNOSIS — R1013 Epigastric pain: Secondary | ICD-10-CM

## 2012-08-11 DIAGNOSIS — B351 Tinea unguium: Secondary | ICD-10-CM

## 2012-08-11 DIAGNOSIS — I10 Essential (primary) hypertension: Secondary | ICD-10-CM

## 2012-08-11 LAB — COMPREHENSIVE METABOLIC PANEL
ALT: 20 U/L (ref 0–35)
AST: 20 U/L (ref 0–37)
BUN: 11 mg/dL (ref 6–23)
Calcium: 9.6 mg/dL (ref 8.4–10.5)
Chloride: 103 mEq/L (ref 96–112)
Creat: 0.95 mg/dL (ref 0.50–1.10)
Total Bilirubin: 0.3 mg/dL (ref 0.3–1.2)

## 2012-08-11 MED ORDER — RANITIDINE HCL 150 MG PO CAPS: 150.0000 mg | ORAL_CAPSULE | Freq: Two times a day (BID) | ORAL | Status: DC

## 2012-08-11 NOTE — Progress Notes (Signed)
  Subjective:    Patient ID: Amanda [name], female    DOB: [birth year], 47 y.o.   MRN: [id number]  HPI  Epigastric pain: Previous physician told her she may have a hernia 3 years ago, but it did not bother her then.  About 3 weeks ago, patient has noticed bulging above umbilicus worsening with change in position.  Describes pain as nagging, irritating pain.  It also is painful on palpation.  She complains of alternating loose stool and hard stool.  She has noticed blood on the TP after wiping at times.  No associated fevers or vomiting.  Denies any dysuria.  Pain is not worsened by eating.  She has a GERD and takes Prilosec.  Follow up toenail infection: Patient has been taking Lamisil for about 2-3 months for onychomycosis.  She says she has noticed an improvement since starting medication.  She would like to finish one more month of Lamisil.  LFT have been WNL while taking Lamisil.     Review of Systems Per HPI    Objective:   Physical Exam  Constitutional: She appears well-nourished. No distress.  HENT:  Head: Normocephalic and atraumatic.  Cardiovascular: Normal rate.   Pulmonary/Chest: Effort normal.  Abdominal: Soft. Bowel sounds are normal. She exhibits mass. She exhibits no distension. There is no tenderness. There is no rebound and no guarding.  Ext: feet without any wounds or ulcers, toenails look better than previous exam, no signs of infection    Assessment & Plan:

## 2012-08-11 NOTE — Patient Instructions (Signed)
It is hard to tell if you have an abdominal hernia. Because you have had your gallbladder removed, you may contact [facility] to discuss possibility of hernia. For now, stop Prilosec and start Ranitidine daily. If you notice worsening pain associated with nausea/vomiting or fever, please return to clinic or go to ER.  [facility] Address: [address] [place], [place] [address] [phone]

## 2012-08-13 ENCOUNTER — Encounter: Payer: Self-pay | Admitting: Family Medicine

## 2012-08-13 DIAGNOSIS — R1013 Epigastric pain: Secondary | ICD-10-CM | POA: Insufficient documentation

## 2012-08-13 NOTE — Assessment & Plan Note (Signed)
Due to central obesity, it is difficult to palpate hernia, but she has had gallbladder removed, so it is possible she has developed a small abdominal hernia.  This does not seem to bother her much at this time.  Precepted with Dr. Leveda Anna who recommended watchful waiting and discussed complications/red flags of hernias.  Patient can schedule appointment with previous surgeon if she wants to discuss hernia repair.  In the meantime, will change PPI to H2 blocker and see if this improves symptoms.  Follow up with me as needed.

## 2012-08-13 NOTE — Assessment & Plan Note (Signed)
Will check LFT today.  Last liver function panel was WNL.  Continue Lamisil for 4 more weeks, then D/C.  Follow up as needed.

## 2012-09-07 ENCOUNTER — Ambulatory Visit
Admission: RE | Admit: 2012-09-07 | Discharge: 2012-09-07 | Disposition: A | Discharge status: Home / Self Care | Payer: BC Managed Care – PPO | Source: Ambulatory Visit

## 2012-09-07 DIAGNOSIS — Z1231 Encounter for screening mammogram for malignant neoplasm of breast: Secondary | ICD-10-CM

## 2012-09-11 ENCOUNTER — Other Ambulatory Visit: Payer: Self-pay | Admitting: Family Medicine

## 2012-12-14 ENCOUNTER — Other Ambulatory Visit: Payer: Self-pay | Admitting: Family Medicine

## 2012-12-14 DIAGNOSIS — I10 Essential (primary) hypertension: Secondary | ICD-10-CM

## 2012-12-18 ENCOUNTER — Encounter: Payer: Self-pay | Admitting: Family Medicine

## 2012-12-18 ENCOUNTER — Ambulatory Visit (INDEPENDENT_AMBULATORY_CARE_PROVIDER_SITE_OTHER): Payer: BC Managed Care – PPO | Admitting: Family Medicine

## 2012-12-18 VITALS — BP 149/95 | HR 101 | Temp 99.1°F | Ht 63.0 in | Wt 222.4 lb

## 2012-12-18 DIAGNOSIS — I10 Essential (primary) hypertension: Secondary | ICD-10-CM

## 2012-12-18 MED ORDER — MECLIZINE HCL 25 MG PO TABS: 25.0000 mg | ORAL_TABLET | Freq: Three times a day (TID) | ORAL | Status: DC | PRN

## 2012-12-18 MED ORDER — LOSARTAN POTASSIUM 50 MG PO TABS: 50.0000 mg | ORAL_TABLET | Freq: Every day | ORAL | Status: DC

## 2012-12-18 NOTE — Patient Instructions (Addendum)
It was nice to meet you today!  I refilled one month's worth of your blood pressure medicine. Return to the clinic to meet your new primary doctor, Dr. Jordan Likes and follow up on your blood pressure since it was a little high today.  I sent in a prescription for meclizine, which you can take three times daily as needed for dizziness. Come back if not improved in a week, or if you start feeling worse.  Be well, Dr. Pollie Meyer  Vertigo Vertigo means you feel like you or your surroundings are moving when they are not. Vertigo can be dangerous if it occurs when you are at work, driving, or performing difficult activities.  CAUSES  Vertigo occurs when there is a conflict of signals sent to your brain from the visual and sensory systems in your body. There are many different causes of vertigo, including:  Infections, especially in the inner ear.  A bad reaction to a drug or misuse of alcohol and medicines.  Withdrawal from drugs or alcohol.  Rapidly changing positions, such as lying down or rolling over in bed.  A migraine headache.  Decreased blood flow to the brain.  Increased pressure in the brain from a head injury, infection, tumor, or bleeding. SYMPTOMS  You may feel as though the world is spinning around or you are falling to the ground. Because your balance is upset, vertigo can cause nausea and vomiting. You may have involuntary eye movements (nystagmus). DIAGNOSIS  Vertigo is usually diagnosed by physical exam. If the cause of your vertigo is unknown, your caregiver may perform imaging tests, such as an MRI scan (magnetic resonance imaging). TREATMENT  Most cases of vertigo resolve on their own, without treatment. Depending on the cause, your caregiver may prescribe certain medicines. If your vertigo is related to body position issues, your caregiver may recommend movements or procedures to correct the problem. In rare cases, if your vertigo is caused by certain inner ear problems,  you may need surgery. HOME CARE INSTRUCTIONS   Follow your caregiver's instructions.  Avoid driving.  Avoid operating heavy machinery.  Avoid performing any tasks that would be dangerous to you or others during a vertigo episode.  Tell your caregiver if you notice that certain medicines seem to be causing your vertigo. Some of the medicines used to treat vertigo episodes can actually make them worse in some people. SEEK IMMEDIATE MEDICAL CARE IF:   Your medicines do not relieve your vertigo or are making it worse.  You develop problems with talking, walking, weakness, or using your arms, hands, or legs.  You develop severe headaches.  Your nausea or vomiting continues or gets worse.  You develop visual changes.  A family member notices behavioral changes.  Your condition gets worse. MAKE SURE YOU:  Understand these instructions.  Will watch your condition.  Will get help right away if you are not doing well or get worse. Document Released: 12/23/2004 Document Revised: 06/07/2011 Document Reviewed: 10/01/2010 [facility] Patient Information 2014 [place], [place].

## 2012-12-18 NOTE — Progress Notes (Signed)
Patient ID: Amanda [name], female   DOB: 12/27/1965, 47 y.o.   MRN: [id number]  HPI:  Patient presents for a same day appointment to discuss dizziness.  Dizziness: reports that several days ago, began having a dizzy/spinning sensation whenever she stands up or lays down. Has chronic nausea but has had more than normal with this. She is not dizzy when she sits still, it's just with movement. Denies hearing trouble or ringing in her ears. Has had occasional pain beneath her left ear. Also endorses a mild nagging headache on occasion. Has had some feelings of generalized weakness with the dizziness. The dizzy feeling doesn't happen every time she stands up or moves, just some times. Does not feel like she is going to pass out. Has not hit head.   HTN: requests refill on BP medicine. Checks her BP at home on occasion and normally gets between 120s-130s. Denies CP. Endorses occasional chronic SOB.   ROS: See HPI  PMFSH: hx HTN, GERD  PHYSICAL EXAM: BP 149/95  Pulse 101  Temp(Src) 99.1 F (37.3 C) (Oral)  Ht 5\' 3"  (1.6 m)  Wt 222 lb 6.4 oz (100.88 kg)  BMI 39.41 kg/m2 Gen: NAD HEENT: R TM clear, L TM obscured by cerumen impaction, no anterior cervical LAD Heart: RRR Lungs: CTAB, NWOB Neuro: face symmetric, speech normal, strength 5/5 bilat in all extremities, PERRL, negative Dix-Hallpike maneuver, no nystagmus noted  ASSESSMENT/PLAN:  # Vertigo: no abnormalities noted on neurological exam today. Initially history seemed consistent with BPPV, but given that Dix-Hallpike maneuver was negative, this dizziness is likely related to viral labyrinthitis. Will rx meclizine for short term use for symptomatic relief. F/u if worsening or if not better in one week.   See problem based charting for additional assessment/plan.   FOLLOW UP: -f/u in 1 week if not improved, or sooner if worsening. -F/u in 2 weeks with new PCP to meet and discuss blood pressure control.

## 2012-12-19 NOTE — Assessment & Plan Note (Signed)
BP mildly elevated today but the numbers patient reports getting at home are better. As this was a same day appointment, with time to only address one problem, will refill losartan for patient and have her return to meet PCP and discuss BP control.

## 2013-01-03 ENCOUNTER — Ambulatory Visit (INDEPENDENT_AMBULATORY_CARE_PROVIDER_SITE_OTHER): Payer: BC Managed Care – PPO | Admitting: Family Medicine

## 2013-01-03 ENCOUNTER — Encounter: Payer: Self-pay | Admitting: Family Medicine

## 2013-01-03 VITALS — BP 132/82 | HR 80 | Temp 98.3°F | Ht 63.0 in | Wt 225.0 lb

## 2013-01-03 DIAGNOSIS — H6120 Impacted cerumen, unspecified ear: Secondary | ICD-10-CM

## 2013-01-03 DIAGNOSIS — I10 Essential (primary) hypertension: Secondary | ICD-10-CM

## 2013-01-03 DIAGNOSIS — K219 Gastro-esophageal reflux disease without esophagitis: Secondary | ICD-10-CM

## 2013-01-03 DIAGNOSIS — R5381 Other malaise: Secondary | ICD-10-CM | POA: Insufficient documentation

## 2013-01-03 DIAGNOSIS — H6122 Impacted cerumen, left ear: Secondary | ICD-10-CM

## 2013-01-03 LAB — CBC
HCT: 36.3 % (ref 36.0–46.0)
MCV: 89.2 fL (ref 78.0–100.0)
Platelets: 317 10*3/uL (ref 150–400)
RBC: 4.07 MIL/uL (ref 3.87–5.11)
RDW: 13.6 % (ref 11.5–15.5)
WBC: 8.4 10*3/uL (ref 4.0–10.5)

## 2013-01-03 LAB — VITAMIN B12: Vitamin B-12: 318 pg/mL (ref 211–911)

## 2013-01-03 LAB — TSH: TSH: 1.825 u[IU]/mL (ref 0.350–4.500)

## 2013-01-03 MED ORDER — OMEPRAZOLE 20 MG PO CPDR: 20.0000 mg | DELAYED_RELEASE_CAPSULE | Freq: Every day | ORAL | Status: DC

## 2013-01-03 MED ORDER — LOSARTAN POTASSIUM 50 MG PO TABS: 50.0000 mg | ORAL_TABLET | Freq: Every day | ORAL | Status: DC

## 2013-01-03 NOTE — Assessment & Plan Note (Signed)
Left TM not visualized. Water lavage was used and successful removal of cerumen. Left TM was visualized after lavage.

## 2013-01-03 NOTE — Patient Instructions (Signed)
Thank you for coming in,   I am going to get labs today to check to see if you have a vitamin deficiency and any thyroid issues or if you are anemic. I will call you with these results. Once we have these results we can decide which direction we can go with treating your fatigue.   We did a water lavage irrigation of your ear to remove the wax. Let me know if you are still having any problems with your ear or if your dizziness returns.    Please feel free to call with any questions or concerns at any time, at [phone]. --Dr. Jordan Likes

## 2013-01-03 NOTE — Progress Notes (Signed)
Subjective:     Patient ID: Amanda [name], female   DOB: 03/21/1966, 47 y.o.   MRN: [id number]  HPI  1. Fatigue: She has had increasing fatigue the past 2 months. She usually gets 7-8 hours of sleep a night. Her boyfriend does notice that she snores a lot but does not report any gasping for air. She has not had any recent blood loss, dark stools, palpitation, heat/cold intolerance. She drinks at least two caffienated beverages a day. She doesn't eat breakfast and will cook her own food but depends on how tired she is. 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 She dozed off at a meeting she had yesterday.   2. Dizziness: This is a follow up for dizziness. She was prescribed meclozine last appointment. She only took a couple pills because they made her feel tired. She reports that the dizziness resolved on itself.   3. Fullness of left ear: She reports her ear feeling full. It feels like its "popping" frequently. No is no pain or drainage.   Review of Systems All other systems reviewed and otherwise normal.      Objective:   Physical Exam BP 132/82  Pulse 80  Temp(Src) 98.3 F (36.8 C) (Oral)  Ht 5\' 3"  (1.6 m)  Wt 225 lb (102.059 kg)  BMI 39.87 kg/m2 Gen: NAD, alert, cooperative with exam HEENT: right tympanic membrane visualized, left membrane blocked by cerumen, no goiter felt,  CV: RRR, good S1/S2, no murmur Resp: CTABL, no wheezes, non-labored Ext: No edema, warm Skin: no dry skin, no rashes       Assessment:     1. Fatigue  2. Vertigo  3. Cerumen impaction      Plan:

## 2013-01-03 NOTE — Assessment & Plan Note (Signed)
Increasing fatigue for past two months - CBC, Vit D, B12, TSH collected today  - continue taking MVI  - h/o snoring - possible for sleep apnea if no other tests result positive

## 2013-01-04 ENCOUNTER — Telehealth: Payer: Self-pay | Admitting: *Deleted

## 2013-01-04 NOTE — Telephone Encounter (Signed)
Spoke with patient and informed her of below 

## 2013-01-04 NOTE — Telephone Encounter (Signed)
Message copied by Farrell Ours on Thu Jan 04, 2013  3:48 PM ------      Message from: Clare Gandy E      Created: Thu Jan 04, 2013  3:24 PM       Please call patient and inform her all labs are normal. Thank you.        ------

## 2013-05-03 ENCOUNTER — Encounter: Payer: Self-pay | Admitting: Family Medicine

## 2013-05-03 ENCOUNTER — Ambulatory Visit (INDEPENDENT_AMBULATORY_CARE_PROVIDER_SITE_OTHER): Payer: BC Managed Care – PPO | Admitting: Family Medicine

## 2013-05-03 VITALS — BP 126/86 | HR 80 | Temp 98.0°F | Ht 63.0 in | Wt 233.0 lb

## 2013-05-03 DIAGNOSIS — R197 Diarrhea, unspecified: Secondary | ICD-10-CM

## 2013-05-03 MED ORDER — ONDANSETRON HCL 4 MG PO TABS: 4.0000 mg | ORAL_TABLET | Freq: Three times a day (TID) | ORAL | Status: DC | PRN

## 2013-05-03 NOTE — Patient Instructions (Signed)
Follow up if your diarrhea doesn't resolve or if you worsen (abdominal pain, fever, etc).  The nausea medicine is at your pharmacy.   Diet for Diarrhea, Adult Frequent, runny stools (diarrhea) may be caused or worsened by food or drink. Diarrhea may be relieved by changing your diet. Since diarrhea can last up to 7 days, it is easy for you to lose too much fluid from the body and become dehydrated. Fluids that are lost need to be replaced. Along with a modified diet, make sure you drink enough fluids to keep your urine clear or pale yellow. DIET INSTRUCTIONS  Ensure adequate fluid intake (hydration): have 1 cup (8 oz) of fluid for each diarrhea episode. Avoid fluids that contain simple sugars or sports drinks, fruit juices, whole milk products, and sodas. Your urine should be clear or pale yellow if you are drinking enough fluids. Hydrate with an oral rehydration solution that you can purchase at pharmacies, retail stores, and online. You can prepare an oral rehydration solution at home by mixing the following ingredients together:    tsp table salt.   tsp baking soda.   tsp salt substitute containing potassium chloride.  1  tablespoons sugar.  1 L (34 oz) of water.  Certain foods and beverages may increase the speed at which food moves through the gastrointestinal (GI) tract. These foods and beverages should be avoided and include:  Caffeinated and alcoholic beverages.  High-fiber foods, such as raw fruits and vegetables, nuts, seeds, and whole grain breads and cereals.  Foods and beverages sweetened with sugar alcohols, such as xylitol, sorbitol, and mannitol.  Some foods may be well tolerated and may help thicken stool including:  Starchy foods, such as rice, toast, pasta, low-sugar cereal, oatmeal, grits, baked potatoes, crackers, and bagels.   Bananas.   Applesauce.  Add probiotic-rich foods to help increase healthy bacteria in the GI tract, such as yogurt and fermented  milk products. RECOMMENDED FOODS AND BEVERAGES Starches Choose foods with less than 2 g of fiber per serving.  Recommended:  White, [place]French, and pita breads, plain rolls, buns, bagels. Plain muffins, matzo. Soda, saltine, or graham crackers. Pretzels, melba toast, zwieback. Cooked cereals made with water: cornmeal, farina, cream cereals. Dry cereals: refined corn, wheat, rice. Potatoes prepared any way without skins, refined macaroni, spaghetti, noodles, refined rice.  Avoid:  Bread, rolls, or crackers made with whole wheat, multi-grains, rye, bran seeds, nuts, or coconut. Corn tortillas or taco shells. Cereals containing whole grains, multi-grains, bran, coconut, nuts, raisins. Cooked or dry oatmeal. Coarse wheat cereals, granola. Cereals advertised as "high-fiber." Potato skins. Whole grain pasta, wild or brown rice. Popcorn. Sweet potatoes, yams. Sweet rolls, doughnuts, waffles, pancakes, sweet breads. Vegetables  Recommended: Strained tomato and vegetable juices. Most well-cooked and canned vegetables without seeds. Fresh: Tender lettuce, cucumber without the skin, cabbage, spinach, bean sprouts.  Avoid: Fresh, cooked, or canned: Artichokes, baked beans, beet greens, broccoli, Brussels sprouts, corn, kale, legumes, peas, sweet potatoes. Cooked: Green or red cabbage, spinach. Avoid large servings of any vegetables because vegetables shrink when cooked, and they contain more fiber per serving than fresh vegetables. Fruit  Recommended: Cooked or canned: Apricots, applesauce, cantaloupe, cherries, fruit cocktail, grapefruit, grapes, kiwi, mandarin oranges, peaches, pears, plums, watermelon. Fresh: Apples without skin, ripe banana, grapes, cantaloupe, cherries, grapefruit, peaches, oranges, plums. Keep servings limited to  cup or 1 piece.  Avoid: Fresh: Apples with skin, apricots, mangoes, pears, raspberries, strawberries. Prune juice, stewed or dried prunes. Dried fruits, raisins, dates.  Large  servings of all fresh fruits. Protein  Recommended: Ground or well-cooked tender beef, ham, veal, lamb, pork, or poultry. Eggs. Fish, oysters, shrimp, lobster, other seafoods. Liver, organ meats.  Avoid: Tough, fibrous meats with gristle. Peanut butter, smooth or chunky. Cheese, nuts, seeds, legumes, dried peas, beans, lentils. Dairy  Recommended: Yogurt, lactose-free milk, kefir, drinkable yogurt, buttermilk, soy milk, or plain hard cheese.  Avoid: Milk, chocolate milk, beverages made with milk, such as milkshakes. Soups  Recommended: Bouillon, broth, or soups made from allowed foods. Any strained soup.  Avoid: Soups made from vegetables that are not allowed, cream or milk-based soups. Desserts and Sweets  Recommended: Sugar-free gelatin, sugar-free frozen ice pops made without sugar alcohol.  Avoid: Plain cakes and cookies, pie made with fruit, pudding, custard, cream pie. Gelatin, fruit, ice, sherbet, frozen ice pops. Ice cream, ice milk without nuts. Plain hard candy, honey, jelly, molasses, syrup, sugar, chocolate syrup, gumdrops, marshmallows. Fats and Oils  Recommended: Limit fats to less than 8 tsp per day.  Avoid: Seeds, nuts, olives, avocados. Margarine, butter, cream, mayonnaise, salad oils, plain salad dressings. Plain gravy, crisp bacon without rind. Beverages  Recommended: Water, decaffeinated teas, oral rehydration solutions, sugar-free beverages not sweetened with sugar alcohols.  Avoid: Fruit juices, caffeinated beverages (coffee, tea, soda), alcohol, sports drinks, or lemon-lime soda. Condiments  Recommended: Ketchup, mustard, horseradish, vinegar, cocoa powder. Spices in moderation: allspice, basil, bay leaves, celery powder or leaves, cinnamon, cumin powder, curry powder, ginger, mace, marjoram, onion or garlic powder, oregano, paprika, parsley flakes, ground pepper, rosemary, sage, savory, tarragon, thyme, turmeric.  Avoid: Coconut, honey. Document Released:  06/05/2003 Document Revised: 12/08/2011 Document Reviewed: 07/30/2011 [facility] Patient Information 2014 [place], [place].

## 2013-05-03 NOTE — Progress Notes (Signed)
   Subjective:    Patient ID: Amanda [name], female    DOB: 05/31/1965, 48 y.o.   MRN: [id number]  HPI 48 year old female presents for same day appointment regarding nausea/diarrhea.  1) Nausea/Diarrhea - Patient reports that her symptoms began Wednesday morning.  - She reports her diarrhea is now improving. However, she still has nausea. - No recent fevers or chills. No known sick contacts.  No recent travel.  No associated vomiting. - No reports of hematochezia or melena. She does note that the diarrhea is foul smelling. - No other associated symptoms. 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   Review of Systems Per HPI    Objective:   Physical Exam Filed Vitals:   05/03/13 1035  BP: 126/86  Pulse: 80  Temp: 98 F (36.7 C)   Exam: General: well appearing obese female in no acute distress. HEENT: NCAT. Dry mucous membranes.  Cardiovascular: RRR. No murmurs, rubs, or gallops. Respiratory: CTAB. No rales, rhonchi, or wheeze. Abdomen: soft, nontender, nondistended. BS + x 4. No palpable organomegaly.  Extremities: No LE edema.    Assessment & Plan:  See Problem List

## 2013-05-03 NOTE — Assessment & Plan Note (Signed)
Nausea and diarrhea likely viral in nature. No intervention or further work up at this time as diarrhea is improving. Will treat nausea with PRN Zofran. Advised BRAT diet and good hydration. Advised follow up if symptoms persist or if she develops fever, abdominal pain (or other alarming symptoms).

## 2013-05-07 ENCOUNTER — Other Ambulatory Visit: Payer: Self-pay | Admitting: *Deleted

## 2013-05-07 DIAGNOSIS — K219 Gastro-esophageal reflux disease without esophagitis: Secondary | ICD-10-CM

## 2013-05-08 ENCOUNTER — Other Ambulatory Visit: Payer: Self-pay | Admitting: *Deleted

## 2013-05-08 ENCOUNTER — Other Ambulatory Visit: Payer: Self-pay | Admitting: Family Medicine

## 2013-05-08 DIAGNOSIS — K219 Gastro-esophageal reflux disease without esophagitis: Secondary | ICD-10-CM

## 2013-05-08 MED ORDER — OMEPRAZOLE 20 MG PO CPDR: 20.0000 mg | DELAYED_RELEASE_CAPSULE | Freq: Every day | ORAL | Status: DC

## 2013-05-08 NOTE — Telephone Encounter (Signed)
Refilled omeprazole #90 [id number] refills.

## 2013-08-15 ENCOUNTER — Other Ambulatory Visit: Payer: Self-pay

## 2013-08-30 ENCOUNTER — Encounter: Payer: Self-pay | Admitting: Sports Medicine

## 2013-08-30 ENCOUNTER — Ambulatory Visit (INDEPENDENT_AMBULATORY_CARE_PROVIDER_SITE_OTHER): Payer: BC Managed Care – PPO | Admitting: Sports Medicine

## 2013-08-30 ENCOUNTER — Other Ambulatory Visit: Payer: Self-pay | Admitting: Sports Medicine

## 2013-08-30 VITALS — BP 126/80 | HR 75 | Temp 98.1°F | Ht 63.0 in | Wt 225.8 lb

## 2013-08-30 DIAGNOSIS — R599 Enlarged lymph nodes, unspecified: Secondary | ICD-10-CM

## 2013-08-30 DIAGNOSIS — R590 Localized enlarged lymph nodes: Secondary | ICD-10-CM | POA: Insufficient documentation

## 2013-08-30 LAB — CBC WITH DIFFERENTIAL/PLATELET
Basophils Absolute: 0 10*3/uL (ref 0.0–0.1)
Basophils Relative: 0 % (ref 0–1)
EOS ABS: 0.1 10*3/uL (ref 0.0–0.7)
EOS PCT: 1 % (ref 0–5)
HEMATOCRIT: 36.1 % (ref 36.0–46.0)
HEMOGLOBIN: 12 g/dL (ref 12.0–15.0)
LYMPHS ABS: 2.8 10*3/uL (ref 0.7–4.0)
Lymphocytes Relative: 33 % (ref 12–46)
MCH: 29.2 pg (ref 26.0–34.0)
MCHC: 33.2 g/dL (ref 30.0–36.0)
MCV: 87.8 fL (ref 78.0–100.0)
MONO ABS: 0.5 10*3/uL (ref 0.1–1.0)
MONOS PCT: 6 % (ref 3–12)
Neutro Abs: 5.2 10*3/uL (ref 1.7–7.7)
Neutrophils Relative %: 60 % (ref 43–77)
Platelets: 335 10*3/uL (ref 150–400)
RBC: 4.11 MIL/uL (ref 3.87–5.11)
RDW: 14.5 % (ref 11.5–15.5)
WBC: 8.6 10*3/uL (ref 4.0–10.5)

## 2013-08-30 MED ORDER — MELOXICAM 15 MG PO TABS: 15.0000 mg | ORAL_TABLET | Freq: Every day | ORAL | Status: DC

## 2013-08-30 NOTE — Progress Notes (Signed)
  Amanda [name] - 48 y.o. female MRN [id number]  Date of birth: [birth year]  CC, SUBJECTIVE & ROS:     If applicable, see problem based charting for additional problem specific documentation. Chief Complaint  Patient presents with  . right arm pit pain and mass    started ~ 10 days ago; very tender, no tx   Patient reports noting a right axillary mass that was tender to touch.  The size and tenderness have improved since onset spontaneously.  No treatment has been tried.  There are no other associated signs and symptoms.  She has not noted any breast masses or breast changes.   HISTORY: Past Medical, Surgical, Social, and Family History Reviewed & Updated per EMR.  Pertinent Historical Findings include: Hypertension, gastroesophageal reflux, last mammogram 09/08/2012.  BI-RADS 1,   weight is fluctuant and stable from a year ago.  Positive family history breast cancer in her sister.  OBJECTIVE:  VS: BP:126/80 mmHg  HR:75bpm  TEMP:98.1 F (36.7 C)(Oral)  RESP:   HT:5\' 3"  (160 cm)   WT:225 lb 12.8 oz (102.422 kg)  BMI:40.1 PHYSICAL EXAM:  GENERAL:  Adult obese Caucasian female. In no discomfort; no respiratory distress PSYCH:  alert and appropriate, good insight  HNEENT:  mmm, no JVD CARDIAC:  RRR, S1/S2 heard, no murmur LUNGS:  CTA B, no wheezes, no crackles Lymphatics:  Right-sided 4 mm anterior axillary mass that is mobile,  Tender.  No other generalized or focal lymphadenopathy and cervical, right axillary or inguinal regions. Breasts:  Heterogeneous breast tissue without focal mass appreciated.  Tender diffusely, slight right-sided nipple retraction.  No skin changes, no peau d' orange  ASSESSMENT & PLAN: See problem based charting & AVS for pt instructions.

## 2013-08-30 NOTE — Assessment & Plan Note (Addendum)
Acute condition  - question of reactive versus potential malignant etiology.  Patient is due for her mammogram and I have referred her for repeat testing.  No other systemic signs or symptoms. 1. Mammogram. 2. Basic lab work including CBC with differential and CMET.    3. Meloxicam for symptomatic treatment 4. I have discussed the expected course and duration of this process and have reviewed signs and symptoms that warrant emergent evaluation. > Followup for resolution

## 2013-08-30 NOTE — Patient Instructions (Signed)
We are checking some lab work today and I would like for you to follow up to get a mammogram. I suspect this is a lymph node and would like for you to try taking meloxicam daily for the next 5 days to see if this helps with the discomfort

## 2013-08-31 LAB — COMPREHENSIVE METABOLIC PANEL
ALK PHOS: 56 U/L (ref 39–117)
ALT: 32 U/L (ref 0–35)
AST: 24 U/L (ref 0–37)
Albumin: 4 g/dL (ref 3.5–5.2)
BILIRUBIN TOTAL: 0.5 mg/dL (ref 0.2–1.2)
BUN: 11 mg/dL (ref 6–23)
CO2: 25 mEq/L (ref 19–32)
CREATININE: 0.83 mg/dL (ref 0.50–1.10)
Calcium: 9.4 mg/dL (ref 8.4–10.5)
Chloride: 103 mEq/L (ref 96–112)
GLUCOSE: 102 mg/dL — AB (ref 70–99)
Potassium: 3.5 mEq/L (ref 3.5–5.3)
SODIUM: 136 meq/L (ref 135–145)
TOTAL PROTEIN: 6.8 g/dL (ref 6.0–8.3)

## 2013-08-31 NOTE — Progress Notes (Signed)
Overall reassuring labs.  Recommend close followup and ensuring mammogram obtained.

## 2013-09-05 ENCOUNTER — Telehealth: Payer: Self-pay | Admitting: Family Medicine

## 2013-09-05 NOTE — Telephone Encounter (Signed)
Please call her and let her know her labs were all normal and that she needs to ensure she follows up with the mammogram.

## 2013-09-05 NOTE — Telephone Encounter (Signed)
Would like blood work results

## 2013-09-05 NOTE — Telephone Encounter (Signed)
Please advise.Thank you.Amanda [name]  

## 2013-09-06 NOTE — Telephone Encounter (Signed)
Relayed message .Kammy Klett, Virgel Bouquet

## 2013-09-12 ENCOUNTER — Other Ambulatory Visit: Payer: Self-pay | Admitting: Family Medicine

## 2013-09-12 DIAGNOSIS — R590 Localized enlarged lymph nodes: Secondary | ICD-10-CM

## 2013-09-17 ENCOUNTER — Other Ambulatory Visit: Payer: Self-pay | Admitting: Sports Medicine

## 2013-09-17 DIAGNOSIS — R590 Localized enlarged lymph nodes: Secondary | ICD-10-CM

## 2013-09-18 ENCOUNTER — Ambulatory Visit
Admission: RE | Admit: 2013-09-18 | Discharge: 2013-09-18 | Disposition: A | Discharge status: Home / Self Care | Payer: BC Managed Care – PPO | Source: Ambulatory Visit | Attending: Family Medicine | Admitting: Family Medicine

## 2013-09-18 DIAGNOSIS — R590 Localized enlarged lymph nodes: Secondary | ICD-10-CM

## 2014-01-03 ENCOUNTER — Encounter: Payer: Self-pay | Admitting: Family Medicine

## 2014-01-03 ENCOUNTER — Ambulatory Visit (HOSPITAL_COMMUNITY)
Admission: RE | Admit: 2014-01-03 | Discharge: 2014-01-03 | Disposition: A | Discharge status: Home / Self Care | Payer: BC Managed Care – PPO | Source: Ambulatory Visit | Attending: Family Medicine | Admitting: Family Medicine

## 2014-01-03 ENCOUNTER — Ambulatory Visit (INDEPENDENT_AMBULATORY_CARE_PROVIDER_SITE_OTHER): Payer: BC Managed Care – PPO | Admitting: Family Medicine

## 2014-01-03 VITALS — BP 132/84 | HR 77 | Temp 98.1°F | Ht 63.0 in | Wt 198.9 lb

## 2014-01-03 DIAGNOSIS — R0789 Other chest pain: Secondary | ICD-10-CM

## 2014-01-03 DIAGNOSIS — R079 Chest pain, unspecified: Secondary | ICD-10-CM | POA: Insufficient documentation

## 2014-01-03 NOTE — Assessment & Plan Note (Signed)
Chest pain is atypical.  Essentially normal EKG is reassuring.   Patient's only risk factors are HTN and + family history.  Given clinical picture, history, age, sex and few risk factors this is unlikely to be cardiac in nature. Patient reassured today.  Patient to followup closely with PCP if pain recurs.  No indication for stress test at this time. Patient does report that she has some valvular abnormalities in the past; given this and reports of shortness of breath will proceed with echocardiogram.

## 2014-01-03 NOTE — Progress Notes (Signed)
   Subjective:    Patient ID: Amanda [name], female    DOB: 10/02/1965, 48 y.o.   MRN: [id number]  HPI 48 year old female presents for same day appointment with complaints of recent chest pain.  1) Chest pain  Patient reports that 2 days ago she developed sudden onset left-sided chest pain.  She reports that the pain began at 4:30 in the morning. She described the pain as a sharp, moderate in severity.  No associated nausea, vomiting, diaphoresis.  No radiation.  She subsequently got up went to the kitchen and get a glass of water.  Her pain continued for approximately 30 minutes and then suddenly resolved spontaneously.  Following her chest pain, she reports that she was short of breath throughout the day.  However, she reports that she is frequently short of breath at baseline.  Patient is not had any further chest pain since this time.  Risk factors: Positive family history (father with MI @ 6950); HTN.  No known hyperlipidemia.  Nonsmoker. No alcohol use.  Social Hx - Nonsmoker.  Review of Systems Per HPI    Objective:   Physical Exam Filed Vitals:   01/03/14 0917  BP: 132/84  Pulse: 77  Temp: 98.1 F (36.7 C)   Exam: General: well appearing female in no acute distress. Cardiovascular: RRR. No murmurs, rubs, or gallops. Respiratory: CTAB. No rales, rhonchi, or wheeze. Abdomen: soft, nontender, nondistended.  EKG (obtained today and independently reviewed by me) - Normal sinus rhythm at a rate of 70 beats per minute. Probable LAE. Normal intervals. No ST or T wave changes suggestive of ischemia.     Assessment & Plan:  See Problem List

## 2014-01-03 NOTE — Patient Instructions (Signed)
It was nice to see you today.  Your chest pain is atypical and your risk is very low.  Given you history and normal EKG, I do not feel that you need a stress test at this time. We will go ahead and get an echo to evaluate further.  Please follow up with your PCP in the next few weeks if this continues to occur.

## 2014-01-04 ENCOUNTER — Telehealth: Payer: Self-pay | Admitting: Family Medicine

## 2014-01-04 NOTE — Telephone Encounter (Signed)
Patient would like to have scheduled on Monday but will take any other day as long as it sooner than later.

## 2014-01-10 ENCOUNTER — Telehealth: Payer: Self-pay | Admitting: *Deleted

## 2014-01-10 ENCOUNTER — Telehealth (HOSPITAL_COMMUNITY): Payer: Self-pay | Admitting: Unknown Physician Specialty

## 2014-01-10 NOTE — Telephone Encounter (Signed)
LVM for patient to call back, trying to get a prior authorization on her 2D echo so I can schedule. The one plan she has expired 09/26/2013 per [employer]. Please have patient call her insurance to find out what is going on and also find out who exactly I need to call. Both numbers on her [employer] card i tried were not valid for her.

## 2014-01-15 ENCOUNTER — Other Ambulatory Visit: Payer: Self-pay | Admitting: Family Medicine

## 2014-01-28 ENCOUNTER — Encounter: Payer: Self-pay | Admitting: Family Medicine

## 2014-02-26 ENCOUNTER — Emergency Department (INDEPENDENT_AMBULATORY_CARE_PROVIDER_SITE_OTHER)
Admission: EM | Admit: 2014-02-26 | Discharge: 2014-02-26 | Disposition: A | Discharge status: Home / Self Care | Payer: BC Managed Care – PPO | Source: Home / Self Care | Attending: Emergency Medicine | Admitting: Emergency Medicine

## 2014-02-26 ENCOUNTER — Encounter (HOSPITAL_COMMUNITY): Payer: Self-pay | Admitting: Emergency Medicine

## 2014-02-26 DIAGNOSIS — J0100 Acute maxillary sinusitis, unspecified: Secondary | ICD-10-CM

## 2014-02-26 MED ORDER — AMOXICILLIN 500 MG PO CAPS: 1000.0000 mg | ORAL_CAPSULE | Freq: Three times a day (TID) | ORAL | Status: DC

## 2014-02-26 NOTE — ED Notes (Signed)
Symptoms started Friday 11/27. C/o runny nose, face tenderness achy gums, chest pain with coughand episodes of feeling very cold

## 2014-02-26 NOTE — ED Provider Notes (Signed)
CSN: [id number]     Arrival date & time 02/26/14  1803 History   First MD Initiated Contact with Patient 02/26/14 1829     Chief Complaint  Patient presents with  . URI    Patient is a 48 y.o. female presenting with URI.  URI Presenting symptoms: congestion, cough, facial pain, fatigue and rhinorrhea   Presenting symptoms: no ear pain, no fever and no sore throat   Severity:  Severe Onset quality:  Gradual Duration:  4 days Timing:  Constant Progression:  Worsening Chronicity:  Recurrent Relieved by:  OTC medications Worsened by:  Breathing Ineffective treatments:  None tried Associated symptoms: headaches, sinus pain and sneezing   Associated symptoms: no wheezing   Risk factors: no chronic respiratory disease, no diabetes mellitus and no sick contacts   Pt reports onset of runny nose and cold (or allergy) like sx's on Friday. Occasional cough, no known fever but has had chills.  Now she reports worsening sinus pain and pressure. Facial and teeth pain L>R and intermittent frontal area h/a's. Reports clear secretions ansd has had 2 brief nosebleeds since onset of symptoms. Has only tried OTC Tylenol for H/A.  Past Medical History  Diagnosis Date  . Hypertension 1993  . GERD (gastroesophageal reflux disease) 1993  . Vitamin D deficiency 2011   Past Surgical History  Procedure Laterality Date  . Cholecystectomy, laparoscopic    . Tubal ligation    . Abdominal hysterectomy     Family History  Problem Relation Age of Onset  . Hypertension Mother   . Hypertension Father   . Asthma Brother   . Asthma Sister   . Cancer Sister     breast cancer   . Diabetes Father   . Diabetes Sister   . Diabetes Brother   . Hypertension Sister   . Hypertension Brother    History  Substance Use Topics  . Smoking status: Never Smoker   . Smokeless tobacco: Not on file  . Alcohol Use: No   OB History    Gravida Para Term Preterm AB TAB SAB Ectopic Multiple Living   2 2              Review of Systems  Constitutional: Positive for chills and fatigue. Negative for fever.  HENT: Positive for congestion, nosebleeds, postnasal drip, rhinorrhea, sinus pressure and sneezing. Negative for ear pain, sore throat, trouble swallowing and voice change.   Eyes: Negative for pain and discharge.  Respiratory: Positive for cough. Negative for chest tightness, shortness of breath, wheezing and stridor.   Cardiovascular: Negative.   Gastrointestinal: Negative.   Musculoskeletal: Negative.   Allergic/Immunologic: Positive for environmental allergies.  Neurological: Positive for headaches.  Hematological: Negative.     Allergies  Review of patient's allergies indicates no known allergies.  Home Medications   Prior to Admission medications   Medication Sig Start Date End Date Taking? Authorizing Provider  amoxicillin (AMOXIL) 500 MG capsule Take 2 capsules (1,000 mg total) by mouth 3 (three) times daily. 02/26/14   Roma KayserKatherine P Carvin Almas, NP  cholecalciferol (VITAMIN D) 1000 UNITS tablet Take 1,000 Units by mouth daily.    Historical Provider, MD  fexofenadine (ALLEGRA) 180 MG tablet Take 1 tablet (180 mg total) by mouth daily. 05/17/12   Ivy de La Cruz, DO  fluticasone (FLONASE) 50 MCG/ACT nasal spray Place 2 sprays into the nose daily. 04/05/12   Ivy de La Cruz, DO  losartan (COZAAR) 50 MG tablet TAKE ONE TABLET BY MOUTH ONCE  DAILY 01/16/14   Myra RudeJeremy E Schmitz, MD  meclizine (ANTIVERT) 25 MG tablet Take 1 tablet (25 mg total) by mouth 3 (three) times daily as needed for dizziness. 12/18/12   Latrelle DodrillBrittany J McIntyre, MD  meloxicam (MOBIC) 15 MG tablet Take 1 tablet (15 mg total) by mouth daily. 08/30/13   Andrena MewsMichael D Rigby, DO  omeprazole (PRILOSEC) 20 MG capsule Take 1 capsule (20 mg total) by mouth daily. 05/08/13   Myra RudeJeremy E Schmitz, MD  ondansetron (ZOFRAN) 4 MG tablet Take 1 tablet (4 mg total) by mouth every 8 (eight) hours as needed for nausea or vomiting. 05/03/13   Tommie SamsJayce G Cook, DO  ranitidine  (ZANTAC) 150 MG capsule Take 1 capsule (150 mg total) by mouth 2 (two) times daily. 08/11/12   Ivy de La Cruz, DO   BP 144/83 mmHg  Pulse 79  Temp(Src) 98.9 F (37.2 C) (Oral)  Resp 18  SpO2 99% Physical Exam  Constitutional: She is oriented to person, place, and time. She appears well-developed and well-nourished.  HENT:  Head: Normocephalic and atraumatic.  Right Ear: Tympanic membrane, external ear and ear canal normal.  Left Ear: Tympanic membrane and ear canal normal.  Nose: Mucosal edema, rhinorrhea and sinus tenderness present. Right sinus exhibits maxillary sinus tenderness. Right sinus exhibits no frontal sinus tenderness. Left sinus exhibits maxillary sinus tenderness. Left sinus exhibits no frontal sinus tenderness.  Mouth/Throat: Uvula is midline, oropharynx is clear and moist and mucous membranes are normal.  Cardiovascular: Normal rate and regular rhythm.   Pulmonary/Chest: Effort normal and breath sounds normal.  Lymphadenopathy:    She has cervical adenopathy.  Neurological: She is alert and oriented to person, place, and time.  Skin: Skin is warm and dry.  Psychiatric: She has a normal mood and affect.    ED Course  Procedures (including critical care time) Labs Review Labs Reviewed - No data to display  Imaging Review No results found.   MDM   1. Acute maxillary sinusitis, recurrence not specified    Amoxicillin 1gm TID x 10 days. PCP f/u as needed.    Leanne ChangKatherine P Anthonella Klausner, NP 02/26/14 1933

## 2014-02-26 NOTE — Discharge Instructions (Signed)
Sinusitis °Sinusitis is redness, soreness, and puffiness (inflammation) of the air pockets in the bones of your face (sinuses). The redness, soreness, and puffiness can cause air and mucus to get trapped in your sinuses. This can allow germs to grow and cause an infection.  °HOME CARE  °· Drink enough fluids to keep your pee (urine) clear or pale yellow. °· Use a humidifier in your home. °· Run a hot shower to create steam in the bathroom. Sit in the bathroom with the door closed. Breathe in the steam 3-4 times a day. °· Put a warm, moist washcloth on your face 3-4 times a day, or as told by your doctor. °· Use salt water sprays (saline sprays) to wet the thick fluid in your nose. This can help the sinuses drain. °· Only take medicine as told by your doctor. °GET HELP RIGHT AWAY IF:  °· Your pain gets worse. °· You have very bad headaches. °· You are sick to your stomach (nauseous). °· You throw up (vomit). °· You are very sleepy (drowsy) all the time. °· Your face is puffy (swollen). °· Your vision changes. °· You have a stiff neck. °· You have trouble breathing. °MAKE SURE YOU:  °· Understand these instructions. °· Will watch your condition. °· Will get help right away if you are not doing well or get worse. °Document Released: 09/01/2007 Document Revised: 12/08/2011 Document Reviewed: 10/19/2011 °[employer]® Patient Information ©2015 [employer]. This information is not intended to replace advice given to you by your health care provider. Make sure you discuss any questions you have with your health care provider. ° °

## 2014-04-15 ENCOUNTER — Other Ambulatory Visit: Payer: Self-pay | Admitting: Family Medicine

## 2014-04-16 NOTE — Telephone Encounter (Signed)
Spoke with patient and informed her that rx had been sent in

## 2014-05-07 ENCOUNTER — Telehealth: Payer: Self-pay | Admitting: Family Medicine

## 2014-05-07 NOTE — Telephone Encounter (Signed)
Message delivered-appt made

## 2014-05-07 NOTE — Telephone Encounter (Signed)
LVM for patient to call back to inform her that appointment needs to be made before refill will be made

## 2014-06-12 ENCOUNTER — Encounter: Payer: Self-pay | Admitting: Family Medicine

## 2014-06-12 ENCOUNTER — Ambulatory Visit (INDEPENDENT_AMBULATORY_CARE_PROVIDER_SITE_OTHER): Payer: BLUE CROSS/BLUE SHIELD | Admitting: Family Medicine

## 2014-06-12 ENCOUNTER — Telehealth: Payer: Self-pay | Admitting: Family Medicine

## 2014-06-12 VITALS — BP 112/77 | HR 82 | Temp 98.3°F | Ht 63.0 in | Wt 208.0 lb

## 2014-06-12 DIAGNOSIS — K219 Gastro-esophageal reflux disease without esophagitis: Secondary | ICD-10-CM

## 2014-06-12 DIAGNOSIS — J309 Allergic rhinitis, unspecified: Secondary | ICD-10-CM

## 2014-06-12 DIAGNOSIS — J329 Chronic sinusitis, unspecified: Secondary | ICD-10-CM | POA: Insufficient documentation

## 2014-06-12 DIAGNOSIS — I10 Essential (primary) hypertension: Secondary | ICD-10-CM | POA: Diagnosis not present

## 2014-06-12 DIAGNOSIS — R0602 Shortness of breath: Secondary | ICD-10-CM

## 2014-06-12 LAB — LIPID PANEL
CHOL/HDL RATIO: 3.8 ratio
Cholesterol: 215 mg/dL — ABNORMAL HIGH (ref 0–200)
HDL: 57 mg/dL (ref >=46)
LDL Cholesterol: 130 mg/dL — ABNORMAL HIGH (ref 0–99)
Triglycerides: 139 mg/dL (ref <150)
VLDL: 28 mg/dL (ref 0–40)

## 2014-06-12 MED ORDER — LOSARTAN POTASSIUM 50 MG PO TABS: 50.0000 mg | ORAL_TABLET | Freq: Every day | ORAL | Status: DC

## 2014-06-12 MED ORDER — OMEPRAZOLE 20 MG PO CPDR: 20.0000 mg | DELAYED_RELEASE_CAPSULE | Freq: Every day | ORAL | Status: DC

## 2014-06-12 MED ORDER — FEXOFENADINE HCL 180 MG PO TABS: 180.0000 mg | ORAL_TABLET | Freq: Every day | ORAL | Status: DC

## 2014-06-12 MED ORDER — FLUTICASONE PROPIONATE 50 MCG/ACT NA SUSP: 2.0000 | Freq: Every day | NASAL | Status: DC

## 2014-06-12 NOTE — Progress Notes (Signed)
   Subjective:    Patient ID: Amanda [name], female    DOB: 11/08/1965, 49 y.o.   MRN: [id number]  HPI  Amanda [name] is here for f/u.   Recurrent sinus infections: She recently received ABX and recovered from a recent episode. She thinks it's around the time the season's change. Gets these two or three times per year.  When the episodes off she has rhinorrhea, sinus pressure, and green mucous. These episodes last about a month. She denies any symptoms today.   HTN Disease Monitoring: Home BP Monitoring unsure Chest pain- none today but had some few months ago     Dyspnea- yes, wake up early in the morning and can't hardly breath. Lasts couple of hours or the whole day. Goes away on its own. December went on curse. Cough off and on everyday. Father had MI in his 2150's. Oldest sister had a stroke thought to be secondary to embolism. Her sister has a PICC line in her arm from recurrent cellulitis(?).  Medications: Compliance-  yes. Lightheadedness-  none  Edema- none    GERD: has been having reflux every since she was done having children. Her youngest child is in their twenties. She will have it everyday after eating. It occurs regardless if she sits up for a prolonged period of time after eating. It is improved with omeprazole. Denies any nausea or vomiting. Normal bowel movements. Doesn't report a chronic NSAID use.   Current Outpatient Prescriptions on File Prior to Visit  Medication Sig Dispense Refill  . amoxicillin (AMOXIL) 500 MG capsule Take 2 capsules (1,000 mg total) by mouth 3 (three) times daily. 60 capsule 0  . cholecalciferol (VITAMIN D) 1000 UNITS tablet Take 1,000 Units by mouth daily.    . fexofenadine (ALLEGRA) 180 MG tablet Take 1 tablet (180 mg total) by mouth daily. 30 tablet 2  . fluticasone (FLONASE) 50 MCG/ACT nasal spray Place 2 sprays into the nose daily. 16 g 6  . losartan (COZAAR) 50 MG tablet TAKE ONE TABLET BY MOUTH ONCE DAILY 90 tablet 0  . meclizine  (ANTIVERT) 25 MG tablet Take 1 tablet (25 mg total) by mouth 3 (three) times daily as needed for dizziness. 30 tablet 0  . meloxicam (MOBIC) 15 MG tablet Take 1 tablet (15 mg total) by mouth daily. 20 tablet 0  . omeprazole (PRILOSEC) 20 MG capsule Take 1 capsule (20 mg total) by mouth daily. 90 capsule 3  . ondansetron (ZOFRAN) 4 MG tablet Take 1 tablet (4 mg total) by mouth every 8 (eight) hours as needed for nausea or vomiting. 20 tablet 0  . ranitidine (ZANTAC) 150 MG capsule Take 1 capsule (150 mg total) by mouth 2 (two) times daily. 60 capsule 1   No current facility-administered medications on file prior to visit.    SHx: works at [employer].   Health Maintenance: Mammgram less than a year old. Needs Tdap . Hx of hyterectomy.   Review of Systems See HPI     Objective:   Physical Exam BP 112/77 mmHg  Pulse 82  Temp(Src) 98.3 F (36.8 C) (Oral)  Ht 5\' 3"  (1.6 m)  Wt 208 lb (94.348 kg)  BMI 36.85 kg/m2 Gen: NAD, alert, cooperative with exam, well-appearing clear, CV: RRR, good S1/S2, no murmur, no edema,  Resp: CTABL, no wheezes, non-labored Abd: SNTND, BS present, no guarding or organomegaly Skin: no rashes, normal turgor      Assessment & Plan:

## 2014-06-12 NOTE — Patient Instructions (Signed)
Thank you for coming in,   Take the flonase and allegra a couple months before your normally get the sinus infections.   We will get the ECHO and I will call you with the results.   Based on that we may need to get a sleep study.    Please feel free to call with any questions or concerns at any time, at [phone]. --Dr. Jordan LikesSchmitz

## 2014-06-12 NOTE — Assessment & Plan Note (Addendum)
Possible for a component of HF. She was seen by Dr. Adriana Simasook a few months ago and ordered ECHo but this wasn't completed yet. Could be sleep apnea.  Not reporting any pleuritic chest pain or swollen/painful legs and not tachycardic to suggest PE.  - ECHO scheduled.  - if ECHO normal then consider sleep study.

## 2014-06-12 NOTE — Telephone Encounter (Signed)
Insurance verification number is [phone]

## 2014-06-12 NOTE — Assessment & Plan Note (Signed)
Typical presentation. Not related to exercise. No weight loss.  - Refilled omeprazole. -  No need for referral to GI but may need EGD in future.

## 2014-06-12 NOTE — Assessment & Plan Note (Signed)
Well controlled.  - continue current medications.  - TSH and lipid panel.

## 2014-06-12 NOTE — Assessment & Plan Note (Addendum)
Gets 2-3 episodes per year. They resolve after receiving a dose of ABX. These episodes may not meet the acute bacterial Rhinosinusitis diagnostic criteria as I haven't seen her for these, this is all reported from the patient. Thought to be 2/2 allergic rhinitis.  - started flonase. Could be used daily or started two months prior to when her sinus infections usually occur  - allegra to be used on a daily basis.

## 2014-06-13 LAB — TSH: TSH: 2.093 u[IU]/mL (ref 0.350–4.500)

## 2014-06-13 NOTE — Telephone Encounter (Signed)
Called insurance, no precert needed per [employer]. Echo scheduled at [facility] for 3/23 at 1pm, left message on voicemail informing patient.

## 2014-06-14 ENCOUNTER — Telehealth: Payer: Self-pay | Admitting: Family Medicine

## 2014-06-14 ENCOUNTER — Encounter: Payer: Self-pay | Admitting: Family Medicine

## 2014-06-14 NOTE — Telephone Encounter (Signed)
Not going to be able to make ECHO appt next week, needs RN to call her back so she can reschedule it for a different date

## 2014-06-17 NOTE — Telephone Encounter (Signed)
Appointment rescheduled for 3/28 at 9am, patient informed.

## 2014-06-24 ENCOUNTER — Ambulatory Visit (HOSPITAL_COMMUNITY)
Admission: RE | Admit: 2014-06-24 | Discharge: 2014-06-24 | Disposition: A | Discharge status: Home / Self Care | Payer: BLUE CROSS/BLUE SHIELD | Source: Ambulatory Visit | Attending: Family Medicine | Admitting: Family Medicine

## 2014-06-24 DIAGNOSIS — R079 Chest pain, unspecified: Secondary | ICD-10-CM | POA: Diagnosis not present

## 2014-06-24 DIAGNOSIS — R0602 Shortness of breath: Secondary | ICD-10-CM | POA: Insufficient documentation

## 2014-06-24 DIAGNOSIS — R0789 Other chest pain: Secondary | ICD-10-CM | POA: Insufficient documentation

## 2014-06-24 NOTE — Progress Notes (Signed)
  Echocardiogram  2D Echocardiogram has been performed.  Leta JunglingCooper, Cristiano Capri M 06/24/2014, 9:14 AM

## 2014-07-01 ENCOUNTER — Telehealth: Payer: Self-pay | Admitting: Family Medicine

## 2014-07-01 NOTE — Telephone Encounter (Signed)
Would like to know results of echo done on her heart last Monday / Dorothey BasemanSadie Reynolds, ASA

## 2014-07-01 NOTE — Telephone Encounter (Signed)
Called patient in regards to her ECHO results. May need to get a sleep study if patient is still symptomatic.   Myra RudeJeremy E Schmitz, MD PGY-2, [facility] 07/01/2014, 5:15 PM

## 2014-07-15 ENCOUNTER — Telehealth: Payer: Self-pay | Admitting: Family Medicine

## 2014-07-15 NOTE — Telephone Encounter (Signed)
Allegra is too expensive for pt and was wondering if something cheaper could be called in / thanks Honey[employer]Sadie Reynolds, ASA

## 2014-07-16 MED ORDER — CETIRIZINE HCL 10 MG PO TABS: 10.0000 mg | ORAL_TABLET | Freq: Every day | ORAL | Status: DC

## 2014-07-16 NOTE — Telephone Encounter (Signed)
Rx for zyrtec #90 sent. This should be generic and even out of pocket should be <$10. -Dr. Waynetta SandyWight

## 2014-07-17 NOTE — Telephone Encounter (Signed)
LVM for pt to call back to inform of below. Amanda [name], April D

## 2014-07-18 NOTE — Telephone Encounter (Signed)
Spoke with pt an informed her of below, pt was very thankful and had already picked it up. Lamonte SakaiZimmerman Rumple, Lamaj Metoyer D

## 2015-01-01 ENCOUNTER — Telehealth: Payer: Self-pay | Admitting: Family Medicine

## 2015-01-01 DIAGNOSIS — I10 Essential (primary) hypertension: Secondary | ICD-10-CM

## 2015-01-01 NOTE — Telephone Encounter (Signed)
Pt called and would like a refill on her BP medication Losartan.jw

## 2015-01-02 MED ORDER — LOSARTAN POTASSIUM 50 MG PO TABS: 50.0000 mg | ORAL_TABLET | Freq: Every day | ORAL | Status: DC

## 2015-01-27 ENCOUNTER — Emergency Department (HOSPITAL_COMMUNITY)
Admission: EM | Admit: 2015-01-27 | Discharge: 2015-01-27 | Disposition: A | Discharge status: Home / Self Care | Payer: PRIVATE HEALTH INSURANCE | Attending: Emergency Medicine | Admitting: Emergency Medicine

## 2015-01-27 ENCOUNTER — Encounter (HOSPITAL_COMMUNITY): Payer: Self-pay | Admitting: Emergency Medicine

## 2015-01-27 DIAGNOSIS — M549 Dorsalgia, unspecified: Secondary | ICD-10-CM | POA: Diagnosis present

## 2015-01-27 DIAGNOSIS — K219 Gastro-esophageal reflux disease without esophagitis: Secondary | ICD-10-CM | POA: Diagnosis not present

## 2015-01-27 DIAGNOSIS — Z79899 Other long term (current) drug therapy: Secondary | ICD-10-CM | POA: Insufficient documentation

## 2015-01-27 DIAGNOSIS — Z7951 Long term (current) use of inhaled steroids: Secondary | ICD-10-CM | POA: Diagnosis not present

## 2015-01-27 DIAGNOSIS — M5432 Sciatica, left side: Secondary | ICD-10-CM | POA: Diagnosis not present

## 2015-01-27 DIAGNOSIS — E559 Vitamin D deficiency, unspecified: Secondary | ICD-10-CM | POA: Insufficient documentation

## 2015-01-27 DIAGNOSIS — Z8781 Personal history of (healed) traumatic fracture: Secondary | ICD-10-CM | POA: Diagnosis not present

## 2015-01-27 DIAGNOSIS — I10 Essential (primary) hypertension: Secondary | ICD-10-CM | POA: Insufficient documentation

## 2015-01-27 MED ORDER — PREDNISONE 10 MG (21) PO TBPK: 10.0000 mg | ORAL_TABLET | Freq: Every day | ORAL | Status: DC

## 2015-01-27 NOTE — ED Provider Notes (Signed)
CSN: [id number]     Arrival date & time 01/27/15  4098 History   First MD Initiated Contact with Patient 01/27/15 [phone]     Chief Complaint  Patient presents with  . Back Pain    HPI  Amanda [name] is a 49 yo F pw 6 day history of back pain that has gotten progresively worse since Saturday night. She states the pain starts in her left hip and radiates down her leg. She describes the pain as sharp, constant, 9/10 pain scale, worsened with pressure applied to her buttock. Years ago she told by her chiropractor that she has a curvature of her lower back and "fractures near the top of her back." No workup was done at that time. No fever, saddle paresthesias, numbness, tingling, loss of bladder/bowel control.   Past Medical History  Diagnosis Date  . Hypertension 1993  . GERD (gastroesophageal reflux disease) 1993  . Vitamin D deficiency 2011   Past Surgical History  Procedure Laterality Date  . Cholecystectomy, laparoscopic    . Tubal ligation    . Abdominal hysterectomy     Family History  Problem Relation Age of Onset  . Hypertension Mother   . Hypertension Father   . Asthma Brother   . Asthma Sister   . Cancer Sister     breast cancer   . Diabetes Father   . Diabetes Sister   . Diabetes Brother   . Hypertension Sister   . Hypertension Brother    Social History  Substance Use Topics  . Smoking status: Never Smoker   . Smokeless tobacco: None  . Alcohol Use: No   OB History    Gravida Para Term Preterm AB TAB SAB Ectopic Multiple Living   2 2             Review of Systems  Ten systems are reviewed and are negative for acute change except as noted in the HPI  Allergies  Review of patient's allergies indicates no known allergies.  Home Medications   Prior to Admission medications   Medication Sig Start Date End Date Taking? Authorizing Provider  amoxicillin (AMOXIL) 500 MG capsule Take 2 capsules (1,000 mg total) by mouth 3 (three) times daily. Patient not  taking: Reported on 06/12/2014 02/26/14   Roma Kayser Schorr, NP  cetirizine (ZYRTEC) 10 MG tablet Take 1 tablet (10 mg total) by mouth daily. 07/16/14   Nani Ravens, MD  cholecalciferol (VITAMIN D) 1000 UNITS tablet Take 1,000 Units by mouth daily.    Historical Provider, MD  fluticasone (FLONASE) 50 MCG/ACT nasal spray Place 2 sprays into both nostrils daily. 06/12/14   [name], MD  losartan (COZAAR) 50 MG tablet Take 1 tablet (50 mg total) by mouth daily. 01/02/15   [name], MD  meclizine (ANTIVERT) 25 MG tablet Take 1 tablet (25 mg total) by mouth 3 (three) times daily as needed for dizziness. Patient not taking: Reported on 06/12/2014 12/18/12   Latrelle Dodrill, MD  meloxicam (MOBIC) 15 MG tablet Take 1 tablet (15 mg total) by mouth daily. Patient not taking: Reported on 06/12/2014 08/30/13   Andrena Mews, MD  omeprazole (PRILOSEC) 20 MG capsule Take 1 capsule (20 mg total) by mouth daily. 06/12/14   [name], MD  ondansetron (ZOFRAN) 4 MG tablet Take 1 tablet (4 mg total) by mouth every 8 (eight) hours as needed for nausea or vomiting. Patient not taking: Reported on 06/12/2014 05/03/13   Verdis Frederickson  Cook, DO  predniSONE (STERAPRED UNI-PAK 21 TAB) 10 MG (21) TBPK tablet Take 1 tablet (10 mg total) by mouth daily. Take 6 tabs by mouth daily  for 2 days, then 5 tabs for 2 days, then 4 tabs for 2 days, then 3 tabs for 2 days, 2 tabs for 2 days, then 1 tab by mouth daily for 2 days 01/27/15   Melton KrebsSamantha Nicole Jolyne Laye, PA-C  ranitidine (ZANTAC) 150 MG capsule Take 1 capsule (150 mg total) by mouth 2 (two) times daily. Patient not taking: Reported on 06/12/2014 08/11/12   Ivy de La Cruz, DO   BP 146/87 mmHg  Pulse 77  Temp(Src) 98 F (36.7 C) (Oral)  Resp 18  Ht 5\' 3"  (1.6 m)  Wt 216 lb (97.977 kg)  BMI 38.27 kg/m2  SpO2 99% Physical Exam  Constitutional: She is oriented to person, place, and time. She appears well-developed and well-nourished. No distress.  HENT:  Head:  Normocephalic and atraumatic.  Mouth/Throat: Oropharynx is clear and moist. No oropharyngeal exudate.  Eyes: Conjunctivae and EOM are normal. Pupils are equal, round, and reactive to light. Right eye exhibits no discharge. Left eye exhibits no discharge. No scleral icterus.  Neck: Normal range of motion. No tracheal deviation present.  Cardiovascular: Normal rate, regular rhythm, normal heart sounds and intact distal pulses.  Exam reveals no gallop and no friction rub.   No murmur heard. Pulmonary/Chest: Effort normal and breath sounds normal. No respiratory distress. She has no wheezes. She has no rales. She exhibits no tenderness.  Abdominal: Soft. Bowel sounds are normal. She exhibits no distension and no mass. There is no tenderness. There is no rebound and no guarding.  Musculoskeletal: Normal range of motion. She exhibits tenderness. She exhibits no edema.  Left SI joint TTP.  Lymphadenopathy:    She has no cervical adenopathy.  Neurological: She is alert and oriented to person, place, and time. No cranial nerve deficit. Coordination normal.  Skin: Skin is warm and dry. No rash noted. She is not diaphoretic. No erythema.  Psychiatric: She has a normal mood and affect. Her behavior is normal.  Nursing note and vitals reviewed.   ED Course  Procedures   MDM   Final diagnoses:  Sciatica of left side   Patient does not demonstrate any red flags for imaging at this time. Advised high dose ibuprofen TID and follow-up with PCP within one week. We discussed reasons for return. Patient can be safely discharged home. Patient in understanding and in agreement with the plan.   Melton KrebsSamantha Nicole Jeronda Don, PA-C 01/28/15 [phone]  Eber HongBrian Miller, MD 01/29/15 [phone]

## 2015-01-27 NOTE — Discharge Instructions (Signed)
Ms. Ledell PeoplesSanderson,  Nice meeting you! Please follow-up with your primary care provider within one week. Please return to the emergency department if you develop fevers, chills, loss of bladder or bowel function, numbness or tingling in the inside of your thighs. You can take 800 mg ibuprofen (four 200 mg tablets) three times a day with food. I hope you feel better soon!  S. Lane HackerNicole Trestin Vences, PA-C

## 2015-01-27 NOTE — ED Notes (Signed)
Patient states back pain started on Friday of last week.  Patient unaware of any injury, but states she did have a sneezing fit last week and her back hurt after that.  Denies other symptoms.

## 2015-02-17 ENCOUNTER — Ambulatory Visit: Payer: BLUE CROSS/BLUE SHIELD | Admitting: Family Medicine

## 2015-04-16 ENCOUNTER — Other Ambulatory Visit: Payer: Self-pay | Admitting: Family Medicine

## 2015-06-05 ENCOUNTER — Ambulatory Visit (INDEPENDENT_AMBULATORY_CARE_PROVIDER_SITE_OTHER): Payer: PRIVATE HEALTH INSURANCE | Admitting: Family Medicine

## 2015-06-05 ENCOUNTER — Encounter: Payer: Self-pay | Admitting: Family Medicine

## 2015-06-05 VITALS — BP 144/90 | HR 91 | Temp 98.1°F | Wt 231.0 lb

## 2015-06-05 DIAGNOSIS — R109 Unspecified abdominal pain: Secondary | ICD-10-CM | POA: Insufficient documentation

## 2015-06-05 DIAGNOSIS — Z23 Encounter for immunization: Secondary | ICD-10-CM

## 2015-06-05 DIAGNOSIS — R1033 Periumbilical pain: Secondary | ICD-10-CM

## 2015-06-05 DIAGNOSIS — R1013 Epigastric pain: Secondary | ICD-10-CM

## 2015-06-05 LAB — COMPLETE METABOLIC PANEL WITH GFR
ALT: 23 U/L (ref 6–29)
AST: 17 U/L (ref 10–35)
Albumin: 4.2 g/dL (ref 3.6–5.1)
Alkaline Phosphatase: 55 U/L (ref 33–115)
BILIRUBIN TOTAL: 0.4 mg/dL (ref 0.2–1.2)
BUN: 11 mg/dL (ref 7–25)
CHLORIDE: 103 mmol/L (ref 98–110)
CO2: 24 mmol/L (ref 20–31)
Calcium: 9.4 mg/dL (ref 8.6–10.2)
Creat: 0.91 mg/dL (ref 0.50–1.10)
GFR, EST NON AFRICAN AMERICAN: 75 mL/min (ref >=60)
GFR, Est African American: 87 mL/min (ref >=60)
GLUCOSE: 79 mg/dL (ref 65–99)
Potassium: 4 mmol/L (ref 3.5–5.3)
SODIUM: 137 mmol/L (ref 135–146)
TOTAL PROTEIN: 7.1 g/dL (ref 6.1–8.1)

## 2015-06-05 LAB — CBC
HCT: 38.7 % (ref 36.0–46.0)
HEMOGLOBIN: 13.4 g/dL (ref 12.0–15.0)
MCH: 30.7 pg (ref 26.0–34.0)
MCHC: 34.6 g/dL (ref 30.0–36.0)
MCV: 88.8 fL (ref 78.0–100.0)
MPV: 9.9 fL (ref 8.6–12.4)
Platelets: 328 10*3/uL (ref 150–400)
RBC: 4.36 MIL/uL (ref 3.87–5.11)
RDW: 13.7 % (ref 11.5–15.5)
WBC: 11 10*3/uL — AB (ref 4.0–10.5)

## 2015-06-05 LAB — LIPASE: LIPASE: 28 U/L (ref 7–60)

## 2015-06-05 NOTE — Progress Notes (Signed)
   Subjective:    Patient ID: Amanda [name], female    DOB: 06/09/1965, 50 y.o.   MRN: [id number]  Seen for Same day visit for   CC: abdominal pain  She reports 2 years of sharp abdominal pain just superior to her umbilicus at the site of her previous scar from gallbladder removal.  Pain occurs intermittently every few weeks, but she says it has been getting more frequent.  Pain is sharp, last only for seconds to minutes, and resolves without treatment.  She denies any nausea, vomiting, diarrhea.  Previous gallbladder removal and hysterectomy.  Denies any melena or hematochezia.  Nothing seems to make the pain better.  Nothing seems to cause pain.  No associated fevers.  Denies chest pain, shortness of breath, or palpitations.   Additionally, she reports sharp, intermittent epigastric pain that has been occurring for the past 2-3 weeks.  Also, not associated with nausea, vomiting, diarrhea, fevers.  She has a history of GERD that she reports is well controlled on her current PPI.  Denies history of tobacco use, alcohol use, or ulcers.   Smoking history noted  Review of Systems   See HPI for ROS. Objective:  BP 144/90 mmHg  Pulse 91  Temp(Src) 98.1 F (36.7 C) (Oral)  Wt 231 lb (104.781 kg)  General: NAD Cardiac: RRR, normal heart sounds, no murmurs.  Respiratory: CTAB, normal effort Abdomen: soft, nondistended, Bowel sounds present. Tenderness to deep palpation at incisional scar, no hernia appreciate; No epigastric tenderness Skin: warm and dry, no rashes noted    Assessment & Plan:   Abdominal pain 2 years of sharp intermittent abdominal pain occurring more frequently at site of incisional scar from gallbladder removal, possibly related to incisional hernia.  Tenderness to palpation over scar, but no hernia appreciated.  - CT abdomen - f/u with PCP after scan  Abdominal pain, epigastric Sharp intermittent pain x 2 weeks. Hx of GERD w/o ulcers.  No tobacco or alcohol use.   Most likely musculoskeletal in etiology, although not reproducible with palpation.  - check cbc, cmet, lipase

## 2015-06-05 NOTE — Patient Instructions (Signed)
It was great seeing you today. I have order some labs and CT scan today to check on your abdominal pain. I will send you a letter with the results, or call you if we need to make any changes to your current therapies.   Next Appointment  Please make an appointment with Dr Jordan LikesSchmitz in 1-2 weeks for your abdominal pain   I look forward to talking with you again at our next visit. If you have any questions or concerns before then, please call the clinic at [phone].  Take Care,   Dr Wenda LowJames Mattisyn Cardona

## 2015-06-05 NOTE — Assessment & Plan Note (Signed)
Sharp intermittent pain x 2 weeks. Hx of GERD w/o ulcers.  No tobacco or alcohol use.  Most likely musculoskeletal in etiology, although not reproducible with palpation.  - check cbc, cmet, lipase

## 2015-06-05 NOTE — Assessment & Plan Note (Signed)
2 years of sharp intermittent abdominal pain occurring more frequently at site of incisional scar from gallbladder removal, possibly related to incisional hernia.  Tenderness to palpation over scar, but no hernia appreciated.  - CT abdomen - f/u with PCP after scan

## 2015-06-10 ENCOUNTER — Other Ambulatory Visit: Payer: Self-pay | Admitting: Family Medicine

## 2015-06-18 ENCOUNTER — Ambulatory Visit (HOSPITAL_COMMUNITY)
Admission: RE | Admit: 2015-06-18 | Discharge: 2015-06-18 | Disposition: A | Discharge status: Home / Self Care | Payer: PRIVATE HEALTH INSURANCE | Source: Ambulatory Visit | Attending: Family Medicine | Admitting: Family Medicine

## 2015-06-18 ENCOUNTER — Encounter (HOSPITAL_COMMUNITY): Payer: Self-pay

## 2015-06-18 DIAGNOSIS — R1033 Periumbilical pain: Secondary | ICD-10-CM | POA: Diagnosis present

## 2015-06-18 MED ORDER — IOHEXOL 300 MG/ML  SOLN
100.0000 mL | Freq: Once | INTRAMUSCULAR | Status: AC | PRN
  Administered 2015-06-18: 100 mL via INTRAVENOUS

## 2015-06-19 ENCOUNTER — Encounter: Payer: Self-pay | Admitting: Family Medicine

## 2015-06-19 ENCOUNTER — Encounter: Payer: Self-pay | Admitting: Internal Medicine

## 2015-06-19 ENCOUNTER — Ambulatory Visit (INDEPENDENT_AMBULATORY_CARE_PROVIDER_SITE_OTHER): Payer: PRIVATE HEALTH INSURANCE | Admitting: Family Medicine

## 2015-06-19 VITALS — BP 154/80 | HR 87 | Temp 98.3°F | Ht 63.0 in | Wt 228.8 lb

## 2015-06-19 DIAGNOSIS — R109 Unspecified abdominal pain: Secondary | ICD-10-CM | POA: Diagnosis not present

## 2015-06-19 NOTE — Progress Notes (Signed)
   Subjective:    Amanda CableVeronica L [name] - 50 y.o. female MRN [id number]  Date of birth: 05/28/1965  CC  Abdominal pain   HPI  Amanda [name] is here for abdominal pain.   She is following up in regards to her CT scan and imaging.  CT scan was normal and lab work was unrevealing She is still having pain.  The pain is more frequent than what it use to be for at least the past year.  The abdominal pain started 2 years ago.  Stabbing and sharp in nature.  Pain is not associated with anything.  She has a chronic history of alternating diarrhea and constipation since her cholecystectomy in 1991.  She denies any trauma or injury  She had a BTL in 1991.  She has been taking advil only as needed.  Takes omeprazole and doesn't help her with her symptoms.  She has no prior history of STI Has some nausea but no vomiting.   SH: denies any alcohol  PMH: HTN, GERD,   Health Maintenance:  Health Maintenance Due  Topic Date Due  . HIV Screening  11/28/1982  . TETANUS/TDAP  11/28/1986  . PAP SMEAR  01/07/2015    Review of Systems See HPI     Objective:   Physical Exam BP 154/80 mmHg  Pulse 87  Temp(Src) 98.3 F (36.8 C) (Oral)  Ht 5\' 3"  (1.6 m)  Wt 228 lb 12.8 oz (103.783 kg)  BMI 40.54 kg/m2 Gen: NAD, alert, cooperative with exam,  CV: RRR, good S1/S2, no murmur,  Resp: CTABL, no wheezes, non-labored Abd: Soft, ND, BS present, no guarding or organomegaly, no hernia, locates the pain just RUQ  Skin: no rashes, normal turgor  Neuro: no gross deficits.     Assessment & Plan:   Abdominal pain Pain is possible for something in her bile system and also possible for adhesions vs neuropathic pain.  CT and lab work has been revealing.  No improvement with omeprazole  - referral placed to GI.

## 2015-06-19 NOTE — Patient Instructions (Signed)
Thank you for coming in,   I am referring you to gastroenterology so that they may take a look inside if necessary.  Please bring all of your medications with you to each visit.   Sign up for My Chart to have easy access to your labs results, and communication with your Primary care physician   Please feel free to call with any questions or concerns at any time, at [phone]. --Dr. Jordan LikesSchmitz

## 2015-06-22 NOTE — Assessment & Plan Note (Signed)
Pain is possible for something in her bile system and also possible for adhesions vs neuropathic pain.  CT and lab work has been revealing.  No improvement with omeprazole  - referral placed to GI.

## 2015-07-21 ENCOUNTER — Other Ambulatory Visit: Payer: Self-pay | Admitting: Family Medicine

## 2015-07-25 ENCOUNTER — Other Ambulatory Visit: Payer: Self-pay | Admitting: Family Medicine

## 2015-08-12 ENCOUNTER — Encounter: Payer: Self-pay | Admitting: Internal Medicine

## 2015-08-12 ENCOUNTER — Ambulatory Visit (INDEPENDENT_AMBULATORY_CARE_PROVIDER_SITE_OTHER): Payer: PRIVATE HEALTH INSURANCE | Admitting: Internal Medicine

## 2015-08-12 VITALS — BP 150/70 | HR 66 | Ht 63.0 in | Wt 232.0 lb

## 2015-08-12 DIAGNOSIS — G8929 Other chronic pain: Secondary | ICD-10-CM

## 2015-08-12 DIAGNOSIS — K219 Gastro-esophageal reflux disease without esophagitis: Secondary | ICD-10-CM | POA: Diagnosis not present

## 2015-08-12 DIAGNOSIS — R1013 Epigastric pain: Secondary | ICD-10-CM

## 2015-08-12 DIAGNOSIS — R197 Diarrhea, unspecified: Secondary | ICD-10-CM

## 2015-08-12 MED ORDER — NA SULFATE-K SULFATE-MG SULF 17.5-3.13-1.6 GM/177ML PO SOLN: 1.0000 | Freq: Once | ORAL | Status: DC

## 2015-08-12 NOTE — Progress Notes (Signed)
HISTORY OF PRESENT ILLNESS:  Amanda [name] is a 50 y.o. 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 Jordan LikesSchmitz with chief complaints of abdominal pain and diarrhea. The patient reports having had problems with loose stools and urgency since her cholecystectomy in 1991. Problems seem to improve for the most part but has worsened over the past 2 years. Symptoms are postprandial in nature. Occasionally at risk for incontinence. Next, she reports a two-year history of epigastric pain. Initially infrequent. More recently daily. Hurts to touch it hurts with meals. May last a few minutes and is associated with nausea. She does have chronic reflux with classic symptoms unless she takes omeprazole 20 mg daily. This controls reflux symptoms. She has had fluctuating weight but has gained 20 pounds over the past year. She reports bloating and belching. No dysphagia. No bleeding. Patient does tell me that she underwent colonoscopy and upper end[facility] in 2005 to evaluate what sounds like iron deficiency anemia. The patient tells me that the examinations were unremarkable and the etiology of her anemia was determined to be uterine fibroids for which she underwent hysterectomy. Is no family history of colon cancer though her father does have a history of colon polyps. Patient does not use alcohol or smoke. Outside records have been reviewed. The patient had blood work 06/05/2015. Unremarkable comprehensive metabolic panel and CBC with differential. Also, contrast-enhanced CT scan of the abdomen performed 06/18/2015 was unremarkable post cholecystectomy.  REVIEW OF SYSTEMS:  All non-GI ROS negative except for sinus allergy trouble, fatigue, shortness of breath, swelling of the feet, urinary leakage  Past Medical History  Diagnosis Date  . Hypertension 1993  . GERD (gastroesophageal reflux disease)  1993  . Vitamin D deficiency 2011    Past Surgical History  Procedure Laterality Date  . Cholecystectomy, laparoscopic    . Tubal ligation    . Abdominal hysterectomy      Social History Amanda CableVeronica L Tangney  reports that she has never smoked. She does not have any smokeless tobacco history on file. She reports that she does not drink alcohol or use illicit drugs.  family history includes Asthma in her brother and sister; Cancer in her sister; Diabetes in her brother, father, and sister; Hypertension in her brother, father, mother, and sister.  No Known Allergies     PHYSICAL EXAMINATION: Vital signs: BP 150/70 mmHg  Pulse 66  Ht 5\' 3"  (1.6 m)  Wt 232 lb (105.235 kg)  BMI 41.11 kg/m2  SpO2 97%  Constitutional: Pleasant, obese, but generally well-appearing, no acute distress Psychiatric: alert and oriented x3, cooperative Eyes: extraocular movements intact, anicteric, conjunctiva pink Mouth: oral pharynx moist, no lesions Neck: supple without thyromegaly Lymph: no lymphadenopathy Cardiovascular: heart regular rate and rhythm, no murmur Lungs: clear to auscultation bilaterally Abdomen: soft, obese, nontender, nondistended, no obvious ascites, no peritoneal signs, normal bowel sounds, no organomegaly Rectal: Deferred until colonoscopy Extremities: no clubbing cyanosis or lower extremity edema bilaterally Skin: no lesions on visible extremities Neuro: No focal deficits. Cranial nerves intact. DTRs  ASSESSMENT:  #1. Chronic abdominal pain. Etiology unclear. Question breakthrough reflux. Question ulcer. Question musculoskeletal. Question spasm #2. Postprandial urgency with loose stools. May be post cholecystectomy bile salt malabsorption related. Consider IBS. Consider bacterial overgrowth. Consider dietary #3. Morbid obesity #4. GERD. Chronic. Requires PPI   PLAN:  #1. Reflux precautions with attention to weight loss #2. Continue daily PPI  to control GERD symptoms #3.  Upper endoscopy to evaluate abdominal pain.The nature of the procedure, as well as the risks, benefits, and alternatives were carefully and thoroughly reviewed with the patient. Ample time for discussion and questions allowed. The patient understood, was satisfied, and agreed to proceed. #4. Colonoscopy with biopsies to evaluate chronic diarrhea of uncertain etiology.The nature of the procedure, as well as the risks, benefits, and alternatives were carefully and thoroughly reviewed with the patient. Ample time for discussion and questions allowed. The patient understood, was satisfied, and agreed to proceed. #5. Consider Questran post colonoscopy if biopsies negative. #6. Consider antispasmodic such as Levsin for pain if workup negative  A copy of this consultation note has been sent to Dr. Jordan Likes

## 2015-08-12 NOTE — Patient Instructions (Signed)
You have been scheduled for an endoscopy and colonoscopy. Please follow the written instructions given to you at your visit today. Please pick up your prep supplies at the pharmacy within the next 1-3 days. If you use inhalers (even only as needed), please bring them with you on the day of your procedure.  

## 2015-09-01 ENCOUNTER — Ambulatory Visit (AMBULATORY_SURGERY_CENTER): Payer: PRIVATE HEALTH INSURANCE | Admitting: Internal Medicine

## 2015-09-01 ENCOUNTER — Encounter: Payer: Self-pay | Admitting: Internal Medicine

## 2015-09-01 ENCOUNTER — Telehealth: Payer: Self-pay | Admitting: Internal Medicine

## 2015-09-01 VITALS — BP 132/69 | HR 63 | Temp 98.2°F | Resp 11 | Ht 63.0 in | Wt 232.0 lb

## 2015-09-01 DIAGNOSIS — R1013 Epigastric pain: Secondary | ICD-10-CM

## 2015-09-01 DIAGNOSIS — R197 Diarrhea, unspecified: Secondary | ICD-10-CM

## 2015-09-01 DIAGNOSIS — K2100 Gastro-esophageal reflux disease with esophagitis, without bleeding: Secondary | ICD-10-CM

## 2015-09-01 MED ORDER — SODIUM CHLORIDE 0.9 % IV SOLN: 500.0000 mL | INTRAVENOUS | Status: DC

## 2015-09-01 NOTE — Progress Notes (Signed)
Called to room to assist during endoscopic procedure.  Patient ID and intended procedure confirmed with present staff. Received instructions for my participation in the procedure from the performing physician.  

## 2015-09-01 NOTE — Op Note (Signed)
[facility] Patient Name: Amanda [name] Procedure Date: 09/01/2015 2:34 PM MRN: [id number] Endoscopist: Wilhemina Bonito. Marina Goodell , MD Age: 50 Referring MD:  Date of Birth: 09/06/1965 Gender: Female Procedure:                Upper GI endoscopy Indications:              Epigastric abdominal pain, Esophageal reflux Medicines:                Monitored Anesthesia Care Procedure:                Pre-Anesthesia Assessment:                           - Prior to the procedure, a History and Physical                            was performed, and patient medications and                            allergies were reviewed. The patient's tolerance of                            previous anesthesia was also reviewed. The risks                            and benefits of the procedure and the sedation                            options and risks were discussed with the patient.                            All questions were answered, and informed consent                            was obtained. Prior Anticoagulants: The patient has                            taken no previous anticoagulant or antiplatelet                            agents. ASA Grade Assessment: II - A patient with                            mild systemic disease. After reviewing the risks                            and benefits, the patient was deemed in                            satisfactory condition to undergo the procedure.                           - Prior to the procedure, a History and Physical  was performed, and patient medications and                            allergies were reviewed. The patient's tolerance of                            previous anesthesia was also reviewed. The risks                            and benefits of the procedure and the sedation                            options and risks were discussed with the patient.                            All questions were answered, and  informed consent                            was obtained. Prior Anticoagulants: The patient has                            taken no previous anticoagulant or antiplatelet                            agents. ASA Grade Assessment: II - A patient with                            mild systemic disease. After reviewing the risks                            and benefits, the patient was deemed in                            satisfactory condition to undergo the procedure.                           After obtaining informed consent, the endoscope was                            passed under direct vision. Throughout the                            procedure, the patient's blood pressure, pulse, and                            oxygen saturations were monitored continuously. The                            Model GIF-[id number]) scope was introduced                            through the mouth, and advanced to the second part  of duodenum. The upper GI endoscopy was                            accomplished without difficulty. The patient                            tolerated the procedure well. Scope In: Scope Out: Findings:                 Non-severe esophagitis was found, as manifested by                            erythema at the mucosal Z line.                           The exam of the esophagus was otherwise normal.                           The entire examined stomach was normal.                           The examined duodenum was normal. Complications:            No immediate complications. Estimated Blood Loss:     Estimated blood loss: none. Impression:               - Non-severe reflux esophagitis (GERD).                           - Normal stomach.                           - Normal examined duodenum. Recommendation:           - Reflux precautions with attention to weight loss.                           - Continue present medications for acid reflux                             daily.                           - Office follow-up with Dr. Marina Goodell to discuss the                            above findings and recommendations John N. Marina Goodell, MD 09/01/2015 2:48:13 PM This report has been signed electronically. CC Letter to:             [name]

## 2015-09-01 NOTE — Op Note (Signed)
[facility] Patient Name: Amanda [name] Procedure Date: 09/01/2015 2:15 PM MRN: [id number] Endoscopist: Wilhemina Bonito. Marina Goodell , MD Age: 50 Referring MD:  Date of Birth: 1965-11-19 Gender: Female Procedure:                Colonoscopy, with random colon biopsies Indications:              Chronic diarrhea Medicines:                Monitored Anesthesia Care Procedure:                Pre-Anesthesia Assessment:                           - Prior to the procedure, a History and Physical                            was performed, and patient medications and                            allergies were reviewed. The patient's tolerance of                            previous anesthesia was also reviewed. The risks                            and benefits of the procedure and the sedation                            options and risks were discussed with the patient.                            All questions were answered, and informed consent                            was obtained. Prior Anticoagulants: The patient has                            taken no previous anticoagulant or antiplatelet                            agents. ASA Grade Assessment: II - A patient with                            mild systemic disease. After reviewing the risks                            and benefits, the patient was deemed in                            satisfactory condition to undergo the procedure.                           After obtaining informed consent, the colonoscope  was passed under direct vision. Throughout the                            procedure, the patient's blood pressure, pulse, and                            oxygen saturations were monitored continuously. The                            Model CF-[id number]) scope was introduced                            through the anus and advanced to the the cecum,                            identified by appendiceal orifice and  ileocecal                            valve. The terminal ileum, ileocecal valve,                            appendiceal orifice, and rectum were photographed.                            The quality of the bowel preparation was excellent.                            The colonoscopy was performed without difficulty.                            The patient tolerated the procedure well. The bowel                            preparation used was SUPREP. Scope In: 2:20:02 PM Scope Out: 2:33:00 PM Scope Withdrawal Time: 0 hours 10 minutes 18 seconds  Total Procedure Duration: 0 hours 12 minutes 58 seconds  Findings:                 The terminal ileum appeared normal.                           Scattered diverticula were found in the entire                            colon.                           The exam was otherwise without abnormality on                            direct and retroflexion views.                           Biopsies for histology were taken with a cold  forceps from the entire colon for evaluation of                            microscopic colitis. Complications:            No immediate complications. Estimated blood loss:                            None. Estimated Blood Loss:     Estimated blood loss: none. Impression:               - The examined portion of the ileum was normal.                           - Diverticulosis in the entire examined colon.                           - The examination was otherwise normal on direct                            and retroflexion views.                           - Biopsies were taken with a cold forceps from the                            entire colon for evaluation of microscopic colitis. Recommendation:           - Repeat colonoscopy in 10 years for screening                            purposes.                           - Office follow-up with Dr. Marina [name] to discuss                            findings,  impressions, and recommendations.                           - Await pathology results.                           - EGD today. Please see report Wilhemina BonitoJohn N. Marina GoodellPerry, MD 09/01/2015 2:41:44 PM This report has been signed electronically. CC Letter to:             Amanda RudeJeremy E. [name]

## 2015-09-01 NOTE — Progress Notes (Signed)
Report to PACU, RN, vss, BBS= Clear.  

## 2015-09-01 NOTE — Patient Instructions (Signed)
YOU HAD AN ENDOSCOPIC PROCEDURE TODAY AT THE [facility]:   Refer to the procedure report that was given to you for any specific questions about what was found during the examination.  If the procedure report does not answer your questions, please call your gastroenterologist to clarify.  If you requested that your care partner not be given the details of your procedure findings, then the procedure report has been included in a sealed envelope for you to review at your convenience later.  YOU SHOULD EXPECT: Some feelings of bloating in the abdomen. Passage of more gas than usual.  Walking can help get rid of the air that was put into your GI tract during the procedure and reduce the bloating. If you had a lower endoscopy (such as a colonoscopy or flexible sigmoidoscopy) you may notice spotting of blood in your stool or on the toilet paper. If you underwent a bowel prep for your procedure, you may not have a normal bowel movement for a few days.  Please Note:  You might notice some irritation and congestion in your nose or some drainage.  This is from the oxygen used during your procedure.  There is no need for concern and it should clear up in a day or so.  SYMPTOMS TO REPORT IMMEDIATELY:   Following lower endoscopy (colonoscopy or flexible sigmoidoscopy):  Excessive amounts of blood in the stool  Significant tenderness or worsening of abdominal pains  Swelling of the abdomen that is new, acute  Fever of 100F or higher   Following upper endoscopy (EGD)  Vomiting of blood or coffee ground material  New chest pain or pain under the shoulder blades  Painful or persistently difficult swallowing  New shortness of breath  Fever of 100F or higher  Black, tarry-looking stools  For urgent or emergent issues, a gastroenterologist can be reached at any hour by calling [phone].   DIET: Your first meal following the procedure should be a small meal and then it is ok to progress to  your normal diet. Heavy or fried foods are harder to digest and may make you feel nauseous or bloated.  Likewise, meals heavy in dairy and vegetables can increase bloating.  Drink plenty of fluids but you should avoid alcoholic beverages for 24 hours. Try to increase the fiber in your diet, and drink plenty of water.  ACTIVITY:  You should plan to take it easy for the rest of today and you should NOT DRIVE or use heavy machinery until tomorrow (because of the sedation medicines used during the test).    FOLLOW UP: Our staff will call the number listed on your records the next business day following your procedure to check on you and address any questions or concerns that you may have regarding the information given to you following your procedure. If we do not reach you, we will leave a message.  However, if you are feeling well and you are not experiencing any problems, there is no need to return our call.  We will assume that you have returned to your regular daily activities without incident.  If any biopsies were taken you will be contacted by phone or by letter within the next 1-3 weeks.  Please call us at [phone] if you have not heard about the biopsies in 3 weeks.    SIGNATURES/CONFIDENTIALITY: You and/or your care partner have signed paperwork which will be entered into your electronic medical record.  These signatures attest to the fact  that that the information above on your After Visit Summary has been reviewed and is understood.  Full responsibility of the confidentiality of this discharge information lies with you and/or your care-partner.  Read all of the handouts given to you by your recovery room nurse. Thank-you for choosing [place] for your healthcare needs today.

## 2015-09-02 ENCOUNTER — Telehealth: Payer: Self-pay | Admitting: *Deleted

## 2015-09-02 NOTE — Telephone Encounter (Signed)
  Follow up Call-  Call back number 09/01/2015  Post procedure Call Back phone  # [phone]  Permission to leave phone message Yes     Patient questions:  Do you have a fever, pain , or abdominal swelling? No. Pain Score  0 *  Have you tolerated food without any problems? Yes.    Have you been able to return to your normal activities? Yes.    Do you have any questions about your discharge instructions: Diet   No. Medications  No. Follow up visit  No.  Do you have questions or concerns about your Care? No.  Actions: * If pain score is 4 or above: No action needed, pain <4.

## 2015-09-02 NOTE — Telephone Encounter (Signed)
Pt was here for procedure yesterday without any problems

## 2015-09-05 ENCOUNTER — Encounter: Payer: Self-pay | Admitting: Internal Medicine

## 2015-09-10 ENCOUNTER — Telehealth: Payer: Self-pay | Admitting: Internal Medicine

## 2015-09-10 NOTE — Telephone Encounter (Signed)
Results letter mailed to pt and she is aware.

## 2015-09-16 ENCOUNTER — Other Ambulatory Visit: Payer: Self-pay | Admitting: Family Medicine

## 2015-10-13 ENCOUNTER — Other Ambulatory Visit: Payer: Self-pay | Admitting: Family Medicine

## 2015-10-14 NOTE — Telephone Encounter (Signed)
Refill request for Cozaar. Patient has not been seen in clinic for over a year for blood pressure. Called patient and discussed making an appointment to be seen. Patient will call the clinic tomorrow to schedule an appointment. Will refill rx at this time.

## 2015-10-15 ENCOUNTER — Ambulatory Visit: Payer: PRIVATE HEALTH INSURANCE | Admitting: Internal Medicine

## 2015-10-31 ENCOUNTER — Encounter: Payer: Self-pay | Admitting: Family Medicine

## 2015-10-31 ENCOUNTER — Ambulatory Visit (INDEPENDENT_AMBULATORY_CARE_PROVIDER_SITE_OTHER): Payer: PRIVATE HEALTH INSURANCE | Admitting: Family Medicine

## 2015-10-31 VITALS — BP 143/79 | HR 70 | Temp 98.7°F | Ht 63.0 in | Wt 231.4 lb

## 2015-10-31 DIAGNOSIS — G4719 Other hypersomnia: Secondary | ICD-10-CM | POA: Diagnosis not present

## 2015-10-31 DIAGNOSIS — I10 Essential (primary) hypertension: Secondary | ICD-10-CM

## 2015-10-31 MED ORDER — LOSARTAN POTASSIUM 50 MG PO TABS: 100.0000 mg | ORAL_TABLET | Freq: Every day | ORAL | 3 refills | Status: DC

## 2015-10-31 NOTE — Patient Instructions (Signed)
Thank you for coming in today, it was so nice to see you. Today we talked about:   1. Blood pressure. We will increased your losartan. Please check your pressure at home. Our goal is below 140 for the top number and 90 for the bottom number 2. Sleep. I have ordered a sleep study for you. Someone will call you to schedule this.   Please follow up in 3 months. You can schedule this appointment at the front desk before you leave or call the clinic.  If you have any questions or concerns, please do not hesitate to call the office at [phone]. You can also message me directly via MyChart.   Sincerely,  Anders Simmonds, MD

## 2015-10-31 NOTE — Progress Notes (Signed)
   Subjective:    Patient ID: Amanda [name] , female   DOB: 1965/10/11 , 50 y.o..   MRN: [id number]  HPI  MASAYO HANIF is here for BP follow up.  Hypertension Blood pressure at home: Patient has cuff but does not check Exercise: None. Used to go to the gym 1 year ago but then her job changed and now she doesn't have time.  Low salt diet: compliant Meds: Compliant with Losartan. Has tried many other in the past but her BP was always still high. Side effects: none ROS: Denies dizziness, visual changes, nausea, vomiting, chest pain, abdominal pain or shortness of breath.   Daytime Sleepiness/Snoring Concerned about sleep apnea,sometimes has headaches in the morning but it's every now and then. She also snores to the point where people can hear her across the house. Her boyfriend no longer sleeps in the same room as her. Denies anyone telling her she stops breathing in the middle of the night. Does not remember waking up gasping for air. States she gets a good amount of sleep but is always tired during the day no matter what.    Review of Systems: Per HPI. All other systems reviewed and are negative.   Medications: reviewed and updated Current Outpatient Prescriptions  Medication Sig Dispense Refill  . cetirizine (ZYRTEC) 10 MG tablet TAKE ONE TABLET BY MOUTH ONCE DAILY 30 tablet 1  . losartan (COZAAR) 50 MG tablet TAKE ONE TABLET BY MOUTH ONCE DAILY 90 tablet 0  . omeprazole (PRILOSEC) 20 MG capsule TAKE ONE CAPSULE BY MOUTH ONCE DAILY 90 capsule 0   No current facility-administered medications for this visit.     Social Hx:  reports that she has never smoked. She does not have any smokeless tobacco history on file.    Objective:   BP (!) 143/79   Pulse 70   Temp 98.7 F (37.1 C) (Oral)   Ht 5\' 3"  (1.6 m)   Wt 231 lb 6.4 oz (105 kg)   BMI 40.99 kg/m  Physical Exam  Gen: NAD, alert, cooperative with exam, well-appearing, pleasant Cardiac: Regular rate and  rhythm, normal S1/S2, no murmur, no edema, capillary refill brisk  Respiratory: Clear to auscultation bilaterally, no wheezes, non-labored Gastrointestinal: soft, non tender, non distended, bowel sounds present Psych: good insight, normal mood and affect   Assessment & Plan:  Benign essential hypertension Uncontrolled. Initial BP 143/79. BP 144/86 when rechecked manually later in the visit. Patient compliance is excellent. BP goal is under 140/90 according to JNC-8 guidelines - Increase Losartan to 100 mg daily from 50 mg - Patient will check BP at home at least once weekly with her cuff and call clinic if it is higher than 140/90 - Follow up in 3 months if BP controlled - Consider repeat BMP at follow up  Excessive daytime sleepiness Concerns for sleep apnea based on obese body habitus, morning headaches, and daytime sleepiness. Could also be hypothyroidism or anemia. - Will order sleep study  - If sleep study normal, will do further work up

## 2015-11-02 DIAGNOSIS — G4719 Other hypersomnia: Secondary | ICD-10-CM | POA: Insufficient documentation

## 2015-11-02 NOTE — Assessment & Plan Note (Addendum)
Concerns for sleep apnea based on obese body habitus, morning headaches, and daytime sleepiness. Could also be hypothyroidism or anemia. - Will order sleep study  - If sleep study normal, will do further work up

## 2015-11-02 NOTE — Assessment & Plan Note (Signed)
Uncontrolled. Initial BP 143/79. BP 144/86 when rechecked manually later in the visit. Patient compliance is excellent. BP goal is under 140/90 according to JNC-8 guidelines - Increase Losartan to 100 mg daily from 50 mg - Patient will check BP at home at least once weekly with her cuff and call clinic if it is higher than 140/90 - Follow up in 3 months if BP controlled - Consider repeat BMP at follow up

## 2015-11-19 ENCOUNTER — Ambulatory Visit (INDEPENDENT_AMBULATORY_CARE_PROVIDER_SITE_OTHER): Payer: PRIVATE HEALTH INSURANCE | Admitting: Internal Medicine

## 2015-11-19 ENCOUNTER — Encounter: Payer: Self-pay | Admitting: Internal Medicine

## 2015-11-19 VITALS — BP 132/80 | HR 80 | Ht 62.0 in | Wt 233.1 lb

## 2015-11-19 DIAGNOSIS — R197 Diarrhea, unspecified: Secondary | ICD-10-CM | POA: Diagnosis not present

## 2015-11-19 DIAGNOSIS — K219 Gastro-esophageal reflux disease without esophagitis: Secondary | ICD-10-CM | POA: Diagnosis not present

## 2015-11-19 DIAGNOSIS — G8929 Other chronic pain: Secondary | ICD-10-CM

## 2015-11-19 DIAGNOSIS — R1013 Epigastric pain: Secondary | ICD-10-CM | POA: Diagnosis not present

## 2015-11-19 MED ORDER — CHOLESTYRAMINE 4 G PO PACK: 4.0000 g | PACK | Freq: Two times a day (BID) | ORAL | 12 refills | Status: DC

## 2015-11-19 NOTE — Progress Notes (Signed)
HISTORY OF PRESENT ILLNESS:  Amanda [name] is a 50 y.o. female who was initially evaluated 08/12/2015 for chronic abdominal pain, postprandial urgency with loose stools, morbid obesity, and chronic GERD. The dictation for details. She subsequently underwent colonoscopy with biopsies and upper endoscopy 09/01/2015. Upper endoscopy revealed mild esophagitis but was otherwise normal. Colonoscopy revealed diverticulosis but was otherwise normal. The ileum was normal. Random colon biopsies were normal. She presents today for follow-up. Patient reports that she has been adhering to strict reflux precautions and has noticed improvement in her epigastric symptoms and reflux symptoms. She continues however with cramping lower abdominal discomfort which is followed by loose stools in which her pain improves post defecation. We discussed the importance of weight loss, she has been unsuccessful. No new complaints. She does have questions regarding chronic PPI use  REVIEW OF SYSTEMS:  All non-GI ROS negative upon review  Past Medical History:  Diagnosis Date  . GERD (gastroesophageal reflux disease) 1993  . Hypertension 1993  . Vitamin D deficiency 2011    Past Surgical History:  Procedure Laterality Date  . ABDOMINAL HYSTERECTOMY    . CHOLECYSTECTOMY, LAPAROSCOPIC    . TUBAL LIGATION      Social History Amanda CableVeronica L Devoss  reports that she has never smoked. She has never used smokeless tobacco. She reports that she does not drink alcohol or use drugs.  family history includes Asthma in her brother and sister; Cancer in her sister; Diabetes in her brother, father, and sister; Hypertension in her brother, father, mother, and sister.  No Known Allergies     PHYSICAL EXAMINATION: Vital signs: BP 132/80   Pulse 80   Ht 5\' 2"  (1.575 m) Comment: measured without shoes  Wt 233 lb 2 oz (105.7 kg)   BMI 42.64 kg/m  General: Pleasant, Well-developed, well-nourished, no acute distress HEENT:  Sclerae are anicteric, conjunctiva pink. Oral mucosa intact Lungs: Clear Heart: Regular Abdomen: soft, obese, nontender, nondistended, no obvious ascites, no peritoneal signs, normal bowel sounds. No organomegaly. Extremities: No clubbing cyanosis or edema Psychiatric: alert and oriented x3. Cooperative   ASSESSMENT:  #1. GERD. Symptoms have improved with strict adherence to reflux precautions. Upper endoscopy with mild esophagitis. She continues on omeprazole 20 mg daily #2. Abdominal cramping with subsequent loose stools. Worse after cholecystectomy. #3. Obesity   PLAN:  1. Continue reflux precautions. Attention to weight loss 2. Continue PPI. We did discuss current knowledge base regarding chronic PPI use and potential untoward side effects. She understands 3. Prescribed Questran 4 g twice daily to see if this helps with bowel habits and pain. If this is not helpful would consider antispasmodics such as Levbid 4. Routine GI follow-up one year. Sooner if needed. Return to the care of your PCP in the interim  25 minutes was spent face-to-face with the patient. Greater than 50% a time use for counseling regarding her above GI diagnoses and answering questions on chronic PPI use

## 2015-11-19 NOTE — Patient Instructions (Signed)
We have sent the following medications to your pharmacy for you to pick up at your convenience:  Questran  Please follow up in one year  

## 2015-11-20 ENCOUNTER — Encounter: Payer: Self-pay | Admitting: Neurology

## 2015-11-20 ENCOUNTER — Ambulatory Visit (INDEPENDENT_AMBULATORY_CARE_PROVIDER_SITE_OTHER): Payer: PRIVATE HEALTH INSURANCE | Admitting: Neurology

## 2015-11-20 VITALS — BP 132/88 | HR 78 | Resp 16 | Ht 62.0 in | Wt 233.0 lb

## 2015-11-20 DIAGNOSIS — R0681 Apnea, not elsewhere classified: Secondary | ICD-10-CM | POA: Diagnosis not present

## 2015-11-20 DIAGNOSIS — R351 Nocturia: Secondary | ICD-10-CM

## 2015-11-20 DIAGNOSIS — R51 Headache: Secondary | ICD-10-CM | POA: Diagnosis not present

## 2015-11-20 DIAGNOSIS — G4710 Hypersomnia, unspecified: Secondary | ICD-10-CM | POA: Diagnosis not present

## 2015-11-20 DIAGNOSIS — R0683 Snoring: Secondary | ICD-10-CM | POA: Diagnosis not present

## 2015-11-20 DIAGNOSIS — R519 Headache, unspecified: Secondary | ICD-10-CM

## 2015-11-20 NOTE — Patient Instructions (Signed)

## 2015-11-20 NOTE — Progress Notes (Signed)
Subjective:    Patient ID: Amanda [name] is a 50 y.o. female.  HPI     Huston Foley, MD, PhD [facility] [address] [address] [place], [place] [address]  Dear Dr. Jonathon Jordan,   I saw your patient, Amanda [name], upon your kind request in my neurologic clinic today for initial consultation of her sleep disorder, in particular, concern for underlying obstructive sleep apnea. The patient is unaccompanied today. As you know, Amanda [name] is a 50 year old right-handed woman with an underlying medical history of hypertension, allergies, reflux disease, vitamin D deficiency, and morbid obesity, who reports snoring and excessive daytime somnolence. In addition, she reports morning headaches and witnessed apneas, per BF, who cannot sleep in the same bed with her d/t her loud snoring. She has woken up with SOB and has to sit up. She has GER at night. I reviewed your office note from 10/31/2015. Her Epworth sleepiness score is 18 out of 24 today, her fatigue score is 52/63. She works 1st shift, but 12 hour shifts: 8 A to 8 P, 2 days on, 2 off, and every other WE: Fri/Sat/Sun. She has frequent AM headaches: dull achy, generalized.  She has nocturia about twice per night on average. Bedtime is around 10:30 to 11, wakeup time is around 6 AM. She does not typically wake up rested. She lives with her younger son, she has another older son who lives about 15 minutes away. She does not smoke or drink alcohol and denies illicit drug use, she drinks caffeine occasionally, maybe twice a week. Her older sister and her older brother both have sleep apnea and uses CPAP machines.  Her Past Medical History Is Significant For: Past Medical History:  Diagnosis Date  . GERD (gastroesophageal reflux disease) 1993  . Hypertension 1993  . Vitamin D deficiency 2011    Her Past Surgical History Is Significant For: Past Surgical History:  Procedure Laterality Date  .  ABDOMINAL HYSTERECTOMY    . CHOLECYSTECTOMY, LAPAROSCOPIC    . TUBAL LIGATION      Her Family History Is Significant For: Family History  Problem Relation Age of Onset  . Hypertension Mother   . Lupus Mother   . Hypertension Father   . Diabetes Father   . Heart disease Father   . Prostate cancer Father   . Asthma Brother   . Asthma Sister   . Cancer Sister     breast cancer   . Diabetes Sister   . Diabetes Brother   . Hypertension Sister   . Hypertension Brother     Her Social History Is Significant For: Social History   Social History  . Marital status: Widowed    Spouse name: N/A  . Number of children: 2  . Years of education: GED   Occupational History  . Location manager    Social History Main Topics  . Smoking status: Never Smoker  . Smokeless tobacco: Never Used  . Alcohol use No  . Drug use: No  . Sexual activity: Not Asked   Other Topics Concern  . None   Social History Narrative   Lives in [place] with her sister. She works at [employer] living.    Drinks about 2 caffeine drinks a week     Her Allergies Are:  No Known Allergies:   Her Current Medications Are:  Outpatient Encounter Prescriptions as of 11/20/2015  Medication Sig  . cetirizine (ZYRTEC) 10 MG tablet TAKE ONE TABLET BY  MOUTH ONCE DAILY  . cholestyramine (QUESTRAN) 4 g packet Take 1 packet (4 g total) by mouth 2 (two) times daily.  Marland Kitchen losartan (COZAAR) 50 MG tablet Take 2 tablets (100 mg total) by mouth daily.  Marland Kitchen omeprazole (PRILOSEC) 20 MG capsule TAKE ONE CAPSULE BY MOUTH ONCE DAILY   No facility-administered encounter medications on file as of 11/20/2015.   :  Review of Systems:  Out of a complete 14 point review of systems, all are reviewed and negative with the exception of these symptoms as listed below: Review of Systems  Neurological:       Has some trouble staying asleep, snoring, witnessed apnea, wakes up feeling tired, morning headaches, daytime tiredness,  takes a nap when not working.   Epworth Sleepiness Scale 0= would never doze 1= slight chance of dozing 2= moderate chance of dozing 3= high chance of dozing  Sitting and reading:3 Watching TV:3 Sitting inactive in a public place (ex. Theater or meeting):2 As a passenger in a car for an hour without a break:3 Lying down to rest in the afternoon:3 Sitting and talking to someone:1 Sitting quietly after lunch (no alcohol):2 In a car, while stopped in traffic:1 Total:18   Objective:  Neurologic Exam  Physical Exam Physical Examination:   Vitals:   11/20/15 0953  BP: 132/88  Pulse: 78  Resp: 16   General Examination: The patient is a very pleasant 50 y.o. female in no acute distress. She appears well-developed and well-nourished and well groomed.   HEENT: Normocephalic, atraumatic, pupils are equal, round and reactive to light and accommodation. Funduscopic exam is normal with sharp disc margins noted. Extraocular tracking is good without limitation to gaze excursion or nystagmus noted. Normal smooth pursuit is noted. Hearing is grossly intact. Tympanic membranes are clear bilaterally. Face is symmetric with normal facial animation and normal facial sensation. Speech is clear with no dysarthria noted. There is no hypophonia. There is no lip, neck/head, jaw or voice tremor. Neck is supple with full range of passive and active motion. There are no carotid bruits on auscultation. Oropharynx exam reveals: mild mouth dryness, adequate dental hygiene and moderate airway crowding, due to smaller airway entry, larger uvula, tonsils in place, about 1-2+ bilaterally. Neck circumference is 16-3/8 inches. Mallampati is class II. Tongue protrudes centrally and palate elevates symmetrically.   Chest: Clear to auscultation without wheezing, rhonchi or crackles noted.  Heart: S1+S2+0, regular and normal without murmurs, rubs or gallops noted.   Abdomen: Soft, non-tender and non-distended with normal  bowel sounds appreciated on auscultation.  Extremities: There is no pitting edema in the distal lower extremities bilaterally. Pedal pulses are intact.  Skin: Warm and dry without trophic changes noted. There are no varicose veins.  Musculoskeletal: exam reveals no obvious joint deformities, tenderness or joint swelling or erythema.   Neurologically:  Mental status: The patient is awake, alert and oriented in all 4 spheres. Her immediate and remote memory, attention, language skills and fund of knowledge are appropriate. There is no evidence of aphasia, agnosia, apraxia or anomia. Speech is clear with normal prosody and enunciation. Thought process is linear. Mood is normal and affect is normal.  Cranial nerves II - XII are as described above under HEENT exam. In addition: shoulder shrug is normal with equal shoulder height noted. Motor exam: Normal bulk, strength and tone is noted. There is no drift, tremor or rebound. Romberg is negative. Reflexes are 2+ throughout. Babinski: Toes are flexor bilaterally. Fine motor skills and coordination:  intact with normal finger taps, normal hand movements, normal rapid alternating patting, normal foot taps and normal foot agility.  Cerebellar testing: No dysmetria or intention tremor on finger to nose testing. Heel to shin is unremarkable bilaterally. There is no truncal or gait ataxia.  Sensory exam: intact to light touch, pinprick, vibration, temperature sense in the upper and lower extremities.  Gait, station and balance: She stands easily. No veering to one side is noted. No leaning to one side is noted. Posture is age-appropriate and stance is narrow based. Gait shows normal stride length and normal pace. No problems turning are noted. Tandem walk is unremarkable.            Assessment and Plan:   In summary, Amanda [name] is a very pleasant 50 y.o.-year old female  with an underlying medical history of hypertension, allergies, reflux disease and  morbid obesity, whose history and physical exam are indeed concerning for obstructive sleep apnea (OSA). I had a long chat with the patient about my findings and the diagnosis of OSA, its prognosis and treatment options. We talked about medical treatments, surgical interventions and non-pharmacological approaches. I explained in particular the risks and ramifications of untreated moderate to severe OSA, especially with respect to developing cardiovascular disease down the Road, including congestive heart failure, difficult to treat hypertension, cardiac arrhythmias, or stroke. Even type 2 diabetes has, in part, been linked to untreated OSA. Symptoms of untreated OSA include daytime sleepiness, memory problems, mood irritability and mood disorder such as depression and anxiety, lack of energy, as well as recurrent headaches, especially morning headaches. We talked about essation and trying to maintain a healthy lifestyle in general, as well as the importance of weight control. I encouraged the patient to eat healthy, exercise daily and keep well hydrated, to keep a scheduled bedtime and wake time routine, to not skip any meals and eat healthy snacks in between meals. I advised the patient not to drive when feeling sleepy. I recommended the following at this time: sleep study with potential positive airway pressure titration. (We will score hypopneas at 3% and split the sleep study into diagnostic and treatment portion, if the estimated. 2 hour AHI is >15/h). Given her work schedule, she will need a later check in time, such as 9:30 PM.   I explained the sleep test procedure to the patient and also outlined possible surgical and non-surgical treatment options of OSA, including the use of a custom-made dental device (which would require a referral to a specialist dentist or oral surgeon), upper airway surgical options, such as pillar implants, radiofrequency surgery, tongue base surgery, and UPPP (which would  involve a referral to an ENT surgeon). Rarely, jaw surgery such as mandibular advancement may be considered.  I also explained the CPAP treatment option to the patient, who indicated that she would be willing to try CPAP if the need arises. I explained the importance of being compliant with PAP treatment, not only for insurance purposes but primarily to improve Her symptoms, and for the patient's long term health benefit, including to reduce Her cardiovascular risks. I answered all her questions today and the patient was in agreement. I would like to see her back after the sleep study is completed and encouraged her to call with any interim questions, concerns, problems or updates.   Thank you very much for allowing me to participate in the care of this nice patient. If I can be of any further assistance to you please do  not hesitate to call me at [phone].  Sincerely,   Huston FoleySaima Joely Losier, MD, PhD

## 2015-12-23 ENCOUNTER — Other Ambulatory Visit: Payer: Self-pay | Admitting: Family Medicine

## 2015-12-23 MED ORDER — CETIRIZINE HCL 10 MG PO TABS: 10.0000 mg | ORAL_TABLET | Freq: Every day | ORAL | 1 refills | Status: DC

## 2015-12-23 NOTE — Telephone Encounter (Signed)
Pt states her allergy medicine was supposed to be called in last Thursday, but her pharmacy still does not have anything for her. The medication is cetirizine. Pt says her allergies are really bothering her and needs this medication. Please call and let pt know when Rx has been called in. Thanks! ep

## 2015-12-23 NOTE — Telephone Encounter (Signed)
Nurse has not received any medication refills for this patient since July.  Will forward to PCP.  Clovis PuMartin, Tamika L, RN

## 2015-12-24 ENCOUNTER — Other Ambulatory Visit: Payer: Self-pay | Admitting: Family Medicine

## 2015-12-29 ENCOUNTER — Institutional Professional Consult (permissible substitution): Payer: PRIVATE HEALTH INSURANCE | Admitting: Neurology

## 2016-02-20 ENCOUNTER — Other Ambulatory Visit: Payer: Self-pay | Admitting: Family Medicine

## 2016-03-11 ENCOUNTER — Telehealth: Payer: Self-pay | Admitting: *Deleted

## 2016-03-11 NOTE — Telephone Encounter (Signed)
[facility] for Losartan 50 mg. There is a quantity limit of 1 tablet daily for Losartan.  Please change to Losartan 100 mg once daily. Clovis PuMartin, Tamika L, RN

## 2016-03-15 MED ORDER — LOSARTAN POTASSIUM 100 MG PO TABS: 100.0000 mg | ORAL_TABLET | Freq: Every day | ORAL | 3 refills | Status: DC

## 2016-03-15 NOTE — Telephone Encounter (Signed)
Losartan is 100 mg once daily. Thank you.

## 2016-03-17 ENCOUNTER — Other Ambulatory Visit: Payer: Self-pay | Admitting: Family Medicine

## 2016-04-16 DIAGNOSIS — J111 Influenza due to unidentified influenza virus with other respiratory manifestations: Secondary | ICD-10-CM | POA: Diagnosis not present

## 2016-04-20 ENCOUNTER — Other Ambulatory Visit: Payer: Self-pay | Admitting: Family Medicine

## 2016-05-10 ENCOUNTER — Ambulatory Visit (INDEPENDENT_AMBULATORY_CARE_PROVIDER_SITE_OTHER): Payer: BLUE CROSS/BLUE SHIELD | Admitting: Family Medicine

## 2016-05-10 ENCOUNTER — Encounter: Payer: Self-pay | Admitting: Family Medicine

## 2016-05-10 VITALS — BP 128/90 | HR 72 | Temp 98.1°F | Ht 62.0 in | Wt 233.2 lb

## 2016-05-10 DIAGNOSIS — I10 Essential (primary) hypertension: Secondary | ICD-10-CM | POA: Diagnosis not present

## 2016-05-10 DIAGNOSIS — R519 Headache, unspecified: Secondary | ICD-10-CM

## 2016-05-10 DIAGNOSIS — R51 Headache: Secondary | ICD-10-CM

## 2016-05-10 LAB — BASIC METABOLIC PANEL WITH GFR
BUN: 13 mg/dL (ref 7–25)
CALCIUM: 9.6 mg/dL (ref 8.6–10.2)
CO2: 24 mmol/L (ref 20–31)
CREATININE: 0.85 mg/dL (ref 0.50–1.10)
Chloride: 106 mmol/L (ref 98–110)
GFR, EST NON AFRICAN AMERICAN: 81 mL/min (ref >=60)
GFR, Est African American: >89 mL/min (ref >=60)
GLUCOSE: 97 mg/dL (ref 65–99)
Potassium: 4.2 mmol/L (ref 3.5–5.3)
Sodium: 139 mmol/L (ref 135–146)

## 2016-05-10 MED ORDER — CYCLOBENZAPRINE HCL 5 MG PO TABS: 2.5000 mg | ORAL_TABLET | Freq: Every day | ORAL | 1 refills | Status: DC

## 2016-05-10 MED ORDER — KETOROLAC TROMETHAMINE 30 MG/ML IJ SOLN: 60.0000 mg | Freq: Once | INTRAMUSCULAR | Status: DC

## 2016-05-10 MED ORDER — KETOROLAC TROMETHAMINE 60 MG/2ML IM SOLN
60.0000 mg | Freq: Once | INTRAMUSCULAR | Status: AC
  Administered 2016-05-10: 60 mg via INTRAMUSCULAR

## 2016-05-10 NOTE — Patient Instructions (Signed)
Thank you for coming in today, it was so nice to see you! Today we talked about:    Headache: We have given you a shot of Toradol which is a pain medicine today. I have given a prescription for Flexeril to use at night to help you asleep.  Reasons to go the hospital would be any weakness, numbness, worsening of her headache, change in vision  Please try to check her blood pressure every day, your goal blood pressure is for the top number to be under 140 and for the bottom number to be under 90.  Please follow up in one week. You can schedule this appointment at the front desk before you leave or call the clinic.  Bring in all your medications or supplements to each appointment for review.   If we ordered any tests today, you will be notified via telephone of any abnormalities. If everything is normal you will get a letter in the mail.   If you have any questions or concerns, please do not hesitate to call the office at [phone]. You can also message me directly via MyChart.   Sincerely,  Anders Simmondshristina Aiden Rao, MD

## 2016-05-10 NOTE — Progress Notes (Signed)
Subjective:    Patient ID: Amanda [name] , female   DOB: 04/15/1965 , 51 y.o..   MRN: [id number]  HPI  Amanda [name] is here for  Chief Complaint  Patient presents with  . Headache   HEADACHE  Headache started 2 days ago Pain is "hard to explain". She states it's not throbbing  Severity: 2/10 now, but it was 8/10  Location: Back of head Medications tried: tylenol, helps a little but then headache comes right back  Head trauma: No  Sudden onset: No  Previous similar headaches: Yes, patient states that sometimes she wakes up in the morning with a headache  Taking blood thinners: No  History of cancer: No   Symptoms Nose congestion stuffiness:  Not currently, but 3 weeks ago she did  Nausea vomiting: Nausea but no vomiting  Photophobia: No  Noise sensitivity: No Double vision or loss of vision: blurry vision in left eye yesterday that lasted for a few minutes. No seeing spots  Fever: No Neck Stiffness: No  Trouble walking or speaking: No   Patient thinks cause of headache might be: Doesn't know    Review of Symptoms - see HPI PMH - Smoking status noted.    Past Medical History: Patient Active Problem List   Diagnosis Date Noted  . Headache 05/14/2016  . Excessive daytime sleepiness 11/02/2015  . Abdominal pain 06/05/2015  . Abdominal pain, epigastric 06/05/2015  . Shortness of breath 06/12/2014  . Recurrent sinus infections 06/12/2014  . Chest pain, atypical 01/03/2014  . GERD (gastroesophageal reflux disease) 03/08/2012  . Benign essential hypertension 03/08/2012    Medications: reviewed and updated Current Outpatient Prescriptions  Medication Sig Dispense Refill  . cetirizine (ZYRTEC) 10 MG tablet TAKE ONE TABLET BY MOUTH ONCE DAILY 30 tablet 1  . cholestyramine (QUESTRAN) 4 g packet Take 1 packet (4 g total) by mouth 2 (two) times daily. 60 packet 12  . cyclobenzaprine (FLEXERIL) 5 MG tablet Take 0.5 tablets (2.5 mg total) by mouth at bedtime.  30 tablet 1  . losartan (COZAAR) 100 MG tablet Take 1 tablet (100 mg total) by mouth daily. 90 tablet 3  . omeprazole (PRILOSEC) 20 MG capsule TAKE ONE CAPSULE BY MOUTH ONCE DAILY 90 capsule 0   No current facility-administered medications for this visit.     Social Hx:  reports that she has never smoked. She has never used smokeless tobacco.   Objective:   BP 128/90   Pulse 72   Temp 98.1 F (36.7 C) (Oral)   Ht 5\' 2"  (1.575 m)   Wt 233 lb 3.2 oz (105.8 kg)   SpO2 98%   BMI 42.65 kg/m  Physical Exam  Gen: NAD, alert, cooperative with exam, well-appearing HEENT: NCAT, PERRL, clear conjunctiva, oropharynx clear, supple neck Cardiac: Regular rate and rhythm, normal S1/S2, no edema, capillary refill brisk  Respiratory: Clear to auscultation bilaterally, no wheezes, non-labored breathing Neurological: Alert and oriented, CN 2-12 intact, 5/5 strength in bilateral upper and lower extremities, sensation grossly intact throughout Psych: good insight, normal mood and affect  Assessment & Plan:  Headache New onset 2 days ago. Concerning that patient had some brief visual disturbance yesterday. Neurological exam within normal limits today. Eye exam normal. BP slightly elevated to 140/98, no signs of hypertensive emergency. Differentials include tension headache vs headache from hypoxia from sleep apnea (has not been officially diagnosed with sleep apnea yet) vs migraine. - Toradol shot given -return precautions and red flag symptoms discussed -  patient already has referal for sleep study, she will call pulm office to schedule - Tylenol PRN Administrations This Visit    ketorolac (TORADOL) injection 60 mg    Admin Date 05/10/2016 Action Given Dose 60 mg Route Intramuscular Administered By Sunday Spillers, CMA            Anders Simmonds, MD [facility], PGY-2

## 2016-05-11 ENCOUNTER — Encounter: Payer: Self-pay | Admitting: Family Medicine

## 2016-05-14 DIAGNOSIS — R519 Headache, unspecified: Secondary | ICD-10-CM | POA: Insufficient documentation

## 2016-05-14 DIAGNOSIS — R51 Headache: Secondary | ICD-10-CM

## 2016-05-14 NOTE — Assessment & Plan Note (Addendum)
New onset 2 days ago. Concerning that patient had some brief visual disturbance yesterday. Neurological exam within normal limits today. Eye exam normal. BP slightly elevated to 140/98, no signs of hypertensive emergency. Differentials include tension headache vs headache from hypoxia from sleep apnea (has not been officially diagnosed with sleep apnea yet) vs migraine. Precepted with Dr. Jennette KettleNeal. - Toradol shot given -return precautions and red flag symptoms discussed - patient already has referal for sleep study, she will call pulm office to schedule - Tylenol PRN - Flexeril 2.5 mg qhs Administrations This Visit    ketorolac (TORADOL) injection 60 mg    Admin Date 05/10/2016 Action Given Dose 60 mg Route Intramuscular Administered By Sunday SpillersSharon T Saunders, CMA

## 2016-05-17 ENCOUNTER — Ambulatory Visit (INDEPENDENT_AMBULATORY_CARE_PROVIDER_SITE_OTHER): Payer: BLUE CROSS/BLUE SHIELD | Admitting: Neurology

## 2016-05-17 DIAGNOSIS — G4710 Hypersomnia, unspecified: Secondary | ICD-10-CM

## 2016-05-17 DIAGNOSIS — G4733 Obstructive sleep apnea (adult) (pediatric): Secondary | ICD-10-CM

## 2016-05-19 ENCOUNTER — Telehealth: Payer: Self-pay

## 2016-05-19 NOTE — Procedures (Signed)
  [facility] [address], [address] [place], [place] [address]  NAME: Amanda CamaraVeronica [name] DOB: 09/09/1965 MEDICAL RECORD [id number] DOS: 05/17/16 REFERRING PHYSICIAN: Anders Simmondshristina Gambino  Study Performed:  HST/Out of Center Sleep Test  HISTORY: 51 year old woman with a history of hypertension, allergies, reflux disease, vitamin D deficiency, and morbid obesity, who reports snoring, excessive daytime somnolence, nocturia, morning headaches and witnessed apneas, reflux at night.  Epworth sleepiness score is 18 out of 24, fatigue score is 52/63. BMI of 42.6.  STUDY RESULTS: Total Recording Time: 6h 5 min (some lost signal around 2:30) Total Apnea/Hypopnea Index (AHI): 13.8/hour Average Oxygen Saturation: 93% Lowest Oxygen Saturation: 72% Time below 88% saturation: 21 min Average Mean Heart Rate: 76 bpm   IMPRESSION:  Obstructive Sleep Apnea (OSA), Nocturnal Hypoxemia  RECOMMENDATION:  This home sleep test demonstrates overall near moderate obstructive sleep apnea with a total AHI of 13.8/hour and O2 nadir of 72%. There was evidence of nocturnal hypoxemia with time below 88% saturation of over 20 minutes for the night. Given the patient's medical history and sleep related complaints, treatment of her OSA with positive airway pressure (in the form of CPAP) is recommended. This will require a full night CPAP titration study for proper treatment settings and mask fitting and O2 monitoring. The patient and his referring provider will be notified of the test results. The patient [facility].  I certify that I have reviewed the raw data recording prior to the issuance of this report in accordance with the standards of Accreditation of the American Academy of Sleep medicine (AASM).    Huston FoleySaima Aleyda Gindlesperger, MD, PhD [facility]) Diplomat, ABPN (Neurology and Sleep Medicine)

## 2016-05-19 NOTE — Telephone Encounter (Signed)
Pt returned my call. I advised her of her HST results. Pt is agreeable to coming in for a cpap titration as long as insurance covers it. I advised her that our office will be in touch with her regarding this. Pt verbalized understanding of results. Pt had no questions at this time but was encouraged to call back if questions arise.

## 2016-05-19 NOTE — Telephone Encounter (Signed)
I called pt to discuss her sleep study results. No answer, left a message asking her to call me back. 

## 2016-05-19 NOTE — Addendum Note (Signed)
Addended by: Huston FoleyATHAR, [place] on: 05/19/2016 08:57 AM   Modules accepted: Orders

## 2016-05-19 NOTE — Progress Notes (Signed)
Patient referred by Jonathon JordanGambino, seen by me on 11/20/15, HST on 05/17/16:  Please call and notify the patient that the recent home sleep test did suggest the diagnosis of near moderate obstructive sleep apnea and desaturation as low as 72%, therefore I recommend treatment for this in the form of CPAP. I will request an overnight sleep study for proper titration and mask fitting. Please explain to patient and arrange for a CPAP titration study. I have placed an order in the chart. Thanks, and [facility].   Huston FoleySaima Sahalie Beth, MD, PhD [facility])

## 2016-05-19 NOTE — Telephone Encounter (Signed)
-----   Message from Huston FoleySaima Athar, MD sent at 05/19/2016  8:57 AM EST ----- Patient referred by Jonathon JordanGambino, seen by me on 11/20/15, HST on 05/17/16:  Please call and notify the patient that the recent home sleep test did suggest the diagnosis of near moderate obstructive sleep apnea and desaturation as low as 72%, therefore I recommend treatment for this in the form of CPAP. I will request an overnight sleep study for proper titration and mask fitting. Please explain to patient and arrange for a CPAP titration study. I have placed an order in the chart. Thanks, and [facility].   Huston FoleySaima Athar, MD, PhD [facility])

## 2016-05-20 ENCOUNTER — Encounter: Payer: Self-pay | Admitting: Family Medicine

## 2016-05-20 DIAGNOSIS — G4733 Obstructive sleep apnea (adult) (pediatric): Secondary | ICD-10-CM | POA: Insufficient documentation

## 2016-05-21 ENCOUNTER — Ambulatory Visit: Payer: BLUE CROSS/BLUE SHIELD | Admitting: Family Medicine

## 2016-05-24 ENCOUNTER — Telehealth: Payer: Self-pay | Admitting: Neurology

## 2016-05-24 DIAGNOSIS — G4733 Obstructive sleep apnea (adult) (pediatric): Secondary | ICD-10-CM

## 2016-05-24 NOTE — Telephone Encounter (Signed)
[employer] denied CPAP titration suggest autopap.

## 2016-05-24 NOTE — Telephone Encounter (Signed)
We will set patient up with autoPAP at home, as insurance denied in house titration study for OSA. Pls process order and notify patient and set up FU in 10 weeks.       

## 2016-05-25 NOTE — Telephone Encounter (Signed)
I called pt to discuss. No answer, left a message asking her to call me back. 

## 2016-05-25 NOTE — Telephone Encounter (Signed)
Pt returned my call. I advised her that her cpap titration [facility] and that Dr. Frances FurbishAthar suggests an auto pap. Pt is agreeable to this. I advised her that I will send this order to a DME, [facility], and they will call pt to set up PAP, fit for a mask, etc. I reviewed PAP compliance expectations with the pt. Pt is agreeable to a follow up on 08/10/2016 at 9:30am. Pt verbalized understanding. Pt had no questions at this time but was encouraged to call back if questions arise.

## 2016-05-25 NOTE — Telephone Encounter (Signed)
Patient called office returning nurse's call in reference to sleep study results.  Patient available after 3:30pm.  Please call

## 2016-05-25 NOTE — Telephone Encounter (Signed)
I called pt again to discuss. No answer, left a message asking her to call me back. 

## 2016-05-28 ENCOUNTER — Ambulatory Visit: Payer: BLUE CROSS/BLUE SHIELD | Admitting: Family Medicine

## 2016-05-30 NOTE — Progress Notes (Signed)
Subjective:    Patient ID: Amanda CableVeronica L [name] , female   DOB: 12/11/1965 , 51 y.o..   MRN: [id number]  HPI  Amanda [name] is here for  Chief Complaint  Patient presents with  . Hypertension   1. Headache follow up: Patient presented initially on 05/10/2016. She had some concerning findings for brief visual disturbance neurological exam and exam were intact at that time. She notes that since she was seen she has not had a headache as bad as that one that she had when I previously saw her. Sometimes gets little headaches about 2-3 times a week in the morning right when she wakes up. Patient notes that she had some more blurriness in her left eye, it lasts for a couple seconds. No eye pain. No numbness, tingling, or focal weakness.   2. Hypertension Blood pressure at home: Systolic mostly in 140's and low 150s. Diastolic usually in the 80s or 90s. Checks her blood pressure nearly every day. Exercise: None. 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  Low salt diet: Sometimes Medications: Compliant with losartan 100 mg daily Side effects: None ROS: Denies headache, dizziness, visual changes, nausea, vomiting, chest pain, abdominal pain or shortness of breath. BP Readings from Last 3 Encounters:  05/31/16 (!) 144/90  05/10/16 128/90  11/20/15 132/88    Review of Systems: Per HPI. All other systems reviewed and are negative.  Past Medical History: Patient Active Problem List   Diagnosis Date Noted  . Moderate obstructive sleep apnea 05/20/2016  . Headache 05/14/2016  . Excessive daytime sleepiness 11/02/2015  . Abdominal pain 06/05/2015  . Abdominal pain, epigastric 06/05/2015  . Shortness of breath 06/12/2014  . Recurrent sinus infections 06/12/2014  . Chest pain, atypical 01/03/2014  . GERD (gastroesophageal reflux disease) 03/08/2012  . Benign essential hypertension 03/08/2012    Medications: reviewed and updated Current Outpatient Prescriptions  Medication Sig  Dispense Refill  . cetirizine (ZYRTEC) 10 MG tablet Take 1 tablet (10 mg total) by mouth daily. 30 tablet 1  . cholestyramine (QUESTRAN) 4 g packet Take 1 packet (4 g total) by mouth 2 (two) times daily. 60 packet 12  . cyclobenzaprine (FLEXERIL) 5 MG tablet Take 0.5 tablets (2.5 mg total) by mouth at bedtime. 30 tablet 1  . losartan (COZAAR) 100 MG tablet Take 1 tablet (100 mg total) by mouth daily. 90 tablet 3  . omeprazole (PRILOSEC) 20 MG capsule Take 1 capsule (20 mg total) by mouth daily. 90 capsule 0   No current facility-administered medications for this visit.     Social Hx:  reports that she has never smoked. She has never used smokeless tobacco.   Objective:   BP (!) 144/90   Pulse 69   Temp 98.1 F (36.7 C) (Oral)   Ht 5\' 2"  (1.575 m)   Wt 228 lb (103.4 kg)   SpO2 97%   BMI 41.70 kg/m  Physical Exam  Gen: NAD, alert, cooperative with exam, well-appearing Eyes: Nonicteric sclera, normal conjunctiva, pupils equal and reactive to light, extraocular muscles intact Cardiac: Regular rate and rhythm, normal S1/S2, no murmur, no edema, capillary refill brisk  Respiratory: Clear to auscultation bilaterally, no wheezes, non-labored breathing Psych: good insight, normal mood and affect  Assessment & Plan:  Benign essential hypertension Uncontrolled. BP at home seems to be in the 140s and low 150s systolically. Diastolic is controlled. Recently diagnosed with sleep apnea. Has not been fitted her for her CPAP machine yet. Discussed risks  and benefits of starting another antihypertensive agent versus waiting to see if her blood pressure comes down after she starts using her CPAP machine. -Continue losartan 100 mg daily -Continue to check blood pressure daily at home -We'll follow up in one month after CPAP machine usage -Return precautions and red flag symptoms discussed  Headache Improved from last visit. Continues to be intermittent. Likely secondary to obstructive sleep apnea  as it occurs in the morning when she wakes up. No signs of migraine at this time. -Patient is getting her CPAP machine soon -Tylenol when necessary -Will follow up with me if she continues to get headaches even after using her CPAP machine   Anders Simmonds, MD [facility], PGY-2

## 2016-05-31 ENCOUNTER — Encounter: Payer: Self-pay | Admitting: Family Medicine

## 2016-05-31 ENCOUNTER — Ambulatory Visit (INDEPENDENT_AMBULATORY_CARE_PROVIDER_SITE_OTHER): Payer: BLUE CROSS/BLUE SHIELD | Admitting: Family Medicine

## 2016-05-31 DIAGNOSIS — I10 Essential (primary) hypertension: Secondary | ICD-10-CM

## 2016-05-31 DIAGNOSIS — R519 Headache, unspecified: Secondary | ICD-10-CM

## 2016-05-31 DIAGNOSIS — R51 Headache: Secondary | ICD-10-CM

## 2016-05-31 MED ORDER — LOSARTAN POTASSIUM 100 MG PO TABS: 100.0000 mg | ORAL_TABLET | Freq: Every day | ORAL | 3 refills | Status: DC

## 2016-05-31 MED ORDER — OMEPRAZOLE 20 MG PO CPDR: 20.0000 mg | DELAYED_RELEASE_CAPSULE | Freq: Every day | ORAL | 0 refills | Status: DC

## 2016-05-31 MED ORDER — CETIRIZINE HCL 10 MG PO TABS: 10.0000 mg | ORAL_TABLET | Freq: Every day | ORAL | 1 refills | Status: DC

## 2016-05-31 NOTE — Assessment & Plan Note (Addendum)
Improved from last visit. Continues to be intermittent. Likely secondary to obstructive sleep apnea as it occurs in the morning when she wakes up. No signs of migraine at this time. -Patient is getting her CPAP machine soon -Tylenol when necessary -Will follow up with me if she continues to get headaches even after using her CPAP machine

## 2016-05-31 NOTE — Patient Instructions (Signed)
Thank you for coming in today, it was so nice to see you! Today we talked about:    Blood pressure: Please continue checking your blood pressure every day at the same time. Your goal blood pressure is for the top number to be under 140 and the bottom number to be under 90.   Headaches: You can continue taking Tylenol as needed To schedule your  eye exam, please call [facility] at [phone]. They are located at: [address] [place] [address]   Please follow up in a month after you get our CPAP machine.   If you have any questions or concerns, please do not hesitate to call the office at [phone]. You can also message me directly via MyChart.   Sincerely,  Anders Simmondshristina Marcellius Montagna, MD

## 2016-05-31 NOTE — Assessment & Plan Note (Signed)
Uncontrolled. BP at home seems to be in the 140s and low 150s systolically. Diastolic is controlled. Recently diagnosed with sleep apnea. Has not been fitted her for her CPAP machine yet. Discussed risks and benefits of starting another antihypertensive agent versus waiting to see if her blood pressure comes down after she starts using her CPAP machine. -Continue losartan 100 mg daily -Continue to check blood pressure daily at home -We'll follow up in one month after CPAP machine usage -Return precautions and red flag symptoms discussed

## 2016-06-03 DIAGNOSIS — G4733 Obstructive sleep apnea (adult) (pediatric): Secondary | ICD-10-CM | POA: Diagnosis not present

## 2016-06-17 ENCOUNTER — Ambulatory Visit: Payer: BLUE CROSS/BLUE SHIELD | Admitting: Internal Medicine

## 2016-06-17 ENCOUNTER — Ambulatory Visit (INDEPENDENT_AMBULATORY_CARE_PROVIDER_SITE_OTHER): Payer: BLUE CROSS/BLUE SHIELD | Admitting: Family Medicine

## 2016-06-17 ENCOUNTER — Encounter: Payer: Self-pay | Admitting: Family Medicine

## 2016-06-17 VITALS — BP 142/82 | HR 79 | Temp 98.0°F | Ht 62.0 in | Wt 228.4 lb

## 2016-06-17 DIAGNOSIS — N898 Other specified noninflammatory disorders of vagina: Secondary | ICD-10-CM

## 2016-06-17 MED ORDER — DOXYCYCLINE HYCLATE 100 MG PO TABS: 100.0000 mg | ORAL_TABLET | Freq: Two times a day (BID) | ORAL | 0 refills | Status: DC

## 2016-06-17 NOTE — Patient Instructions (Signed)
Thank you so much for coming to visit today! I have sent a prescription for Doxycycline to take twice daily for 10 days. If no improvement or lesion continues to grow in size, please return.  Dr. Caroleen Hammanumley

## 2016-06-20 LAB — HERPES SIMPLEX VIRUS CULTURE

## 2016-06-20 NOTE — Progress Notes (Signed)
Subjective:     Patient ID: Amanda CableVeronica L [name], female   DOB: 04/24/1965, 51 y.o.   MRN: [id number]  HPI Amanda [name] is a 51yo female presenting today for lesion in her right groin. Notes small bump in her right groin that has gotten larger and more painful over the last week. Has now started improving, noting it is no longer painful but is not any smaller. Denies any drainage. No history of prior. Denies any vaginal discharge or bleeding. Last sexually active one month ago and did not use protection, however in a monogamous relationship for 11 years. Denies shaving pubic hair. Denies fever. Never smoker.  Review of Systems Per HPI    Objective:   Physical Exam  Constitutional: Amanda [name] appears well-developed and well-nourished. No distress.  Genitourinary:  Genitourinary Comments: Small boil noted on right labia, no fluctuance palpated, no discharge noted. No vaginal discharge. No lymphadenopathy.  Psychiatric: Amanda [name] has a normal mood and affect. Her behavior is normal.      Assessment and Plan:     1. Vaginal lesion Will swab lesion for HSV, however low suspicion. No fluctuance noted, so no I&D performed. Declines GC/Chlamydia screen. Doxycycline prescribed. Return if lesion worsens or fails to improve.

## 2016-06-21 ENCOUNTER — Telehealth: Payer: Self-pay | Admitting: Family Medicine

## 2016-06-21 NOTE — Telephone Encounter (Signed)
Notified of negative HSV

## 2016-07-04 DIAGNOSIS — G4733 Obstructive sleep apnea (adult) (pediatric): Secondary | ICD-10-CM | POA: Diagnosis not present

## 2016-07-18 DIAGNOSIS — J029 Acute pharyngitis, unspecified: Secondary | ICD-10-CM | POA: Diagnosis not present

## 2016-07-18 DIAGNOSIS — J209 Acute bronchitis, unspecified: Secondary | ICD-10-CM | POA: Diagnosis not present

## 2016-07-18 DIAGNOSIS — J019 Acute sinusitis, unspecified: Secondary | ICD-10-CM | POA: Diagnosis not present

## 2016-08-03 DIAGNOSIS — G4733 Obstructive sleep apnea (adult) (pediatric): Secondary | ICD-10-CM | POA: Diagnosis not present

## 2016-08-10 ENCOUNTER — Encounter: Payer: Self-pay | Admitting: Neurology

## 2016-08-10 ENCOUNTER — Ambulatory Visit (INDEPENDENT_AMBULATORY_CARE_PROVIDER_SITE_OTHER): Payer: BLUE CROSS/BLUE SHIELD | Admitting: Neurology

## 2016-08-10 VITALS — BP 110/78 | HR 76 | Resp 16 | Ht 62.0 in | Wt 230.0 lb

## 2016-08-10 DIAGNOSIS — Z9989 Dependence on other enabling machines and devices: Secondary | ICD-10-CM | POA: Diagnosis not present

## 2016-08-10 DIAGNOSIS — G4733 Obstructive sleep apnea (adult) (pediatric): Secondary | ICD-10-CM

## 2016-08-10 NOTE — Progress Notes (Signed)
Subjective:    Patient ID: Amanda [name] is a 51 y.o. female.  HPI     Interim history:   Ms. Amanda [name] is a 51 year old right-handed woman with an underlying medical history of hypertension, allergies, reflux disease, vitamin D deficiency, and morbid obesity, who presents for follow-up consultation of her sleep disorder, after home sleep study testing and trial of AutoPap therapy. The patient is unaccompanied today. I first met her on 11/20/2015 at the request of her primary care physician, at which time she reported loud snoring, daytime somnolence and witnessed apneas. I invited her for sleep study. Her insurance denied a attended sleep study. She had a home sleep test on 05/17/2016 which showed an AHI of 13.8 per hour, average oxygen saturation of 93%, nadir of 72%. I suggested she return for CPAP titration. This was denied by her insurance. I started her on AutoPap therapy.  Today, 08/10/2016: I reviewed her AutoPap compliance data from 07/10/2016 through 08/08/2016 which is a total of 30 days, during which time she used her machine every night with percent used days greater than 4 hours at 97%, indicating excellent compliance with an average usage of 7 hours and 16 minutes, residual AHI 2.2 per hour, 95th percentile pressure at 12.5 cm, leak low with the 95th percentile at 1.1 L/m on a pressure of 4-13 with EPR. She reports doing rather well, much improved in sleep quality, sleep consolidation, daytime energy better and no more AM HAs. Weight continues to fluctuate, started gaining weight consistently in the beginning of 2016. She reports that she had a more consistent job at the time and was able to afford a gym membership. She is motivated to work more harder on her weight loss.  The patient's allergies, current medications, family history, past medical history, past social history, past surgical history and problem list were reviewed and updated as appropriate.   Previously (copied  from previous notes for reference):   11/20/2015: She reports snoring and excessive daytime somnolence. In addition, she reports morning headaches and witnessed apneas, per BF, cannot sleep in the same bed with her d/t her loud snoring. She has woken up with SOB and has to sit up. She has GER at night. I reviewed your office note from 10/31/2015. Her Epworth sleepiness score is 18 out of 24 today, her fatigue score is 52/63. She works 1st shift, but 12 hour shifts: 8 A to 8 P, 2 days on, 2 off, and every other WE: Fri/Sat/Sun. She has frequent AM headaches: dull achy, generalized.  She has nocturia about twice per night on average. Bedtime is around 10:30 to 11, wakeup time is around 6 AM. She does not typically wake up rested. She lives with her younger son, she has another older son who lives about 15 minutes away. She does not smoke or drink alcohol and denies illicit drug use, she drinks caffeine occasionally, maybe twice a week. Her older sister and her older brother both have sleep apnea and uses CPAP machines.   His Past Medical History Is Significant For: Past Medical History:  Diagnosis Date  . GERD (gastroesophageal reflux disease) 1993  . Hypertension 1993  . Vitamin D deficiency 2011    His Past Surgical History Is Significant For: Past Surgical History:  Procedure Laterality Date  . ABDOMINAL HYSTERECTOMY    . CHOLECYSTECTOMY, LAPAROSCOPIC    . TUBAL LIGATION      His Family History Is Significant For: Family History  Problem Relation Age of Onset  .  Hypertension Mother   . Lupus Mother   . Hypertension Father   . Diabetes Father   . Heart disease Father   . Prostate cancer Father   . Asthma Brother   . Asthma Sister   . Cancer Sister        breast cancer   . Diabetes Sister   . Diabetes Brother   . Hypertension Sister   . Hypertension Brother     His Social History Is Significant For: Social History   Social History  . Marital status: Widowed    Spouse  name: N/A  . Number of children: 2  . Years of education: GED   Occupational History  . Glass blower/designer    Social History Main Topics  . Smoking status: Never Smoker  . Smokeless tobacco: Never Used  . Alcohol use No  . Drug use: No  . Sexual activity: Not Asked   Other Topics Concern  . None   Social History Narrative   Lives in [place] with her sister. She works at [employer] living.    Drinks about 2 caffeine drinks a week     His Allergies Are:  No Known Allergies:   His Current Medications Are:  Outpatient Encounter Prescriptions as of 08/10/2016  Medication Sig  . cetirizine (ZYRTEC) 10 MG tablet Take 1 tablet (10 mg total) by mouth daily.  Marland Kitchen losartan (COZAAR) 100 MG tablet Take 1 tablet (100 mg total) by mouth daily.  Marland Kitchen omeprazole (PRILOSEC) 20 MG capsule Take 1 capsule (20 mg total) by mouth daily.  . [DISCONTINUED] cholestyramine (QUESTRAN) 4 g packet Take 1 packet (4 g total) by mouth 2 (two) times daily.  . [DISCONTINUED] cyclobenzaprine (FLEXERIL) 5 MG tablet Take 0.5 tablets (2.5 mg total) by mouth at bedtime.  . [DISCONTINUED] doxycycline (VIBRA-TABS) 100 MG tablet Take 1 tablet (100 mg total) by mouth 2 (two) times daily.   No facility-administered encounter medications on file as of 08/10/2016.   :  Review of Systems:  Out of a complete 14 point review of systems, all are reviewed and negative with the exception of these symptoms as listed below: Review of Systems  Neurological:       Patient states that she is doing well with her Auto-PAP machine. No new concerns.     Objective:  Neurologic Exam  Physical Exam Physical Examination:   Vitals:   08/10/16 0932  BP: 110/78  Pulse: 76  Resp: 16   General Examination: The patient is a very pleasant 51 y.o. female in no acute distress. She appears well-developed and well-nourished and well groomed.   HEENT: Normocephalic, atraumatic, pupils are equal, round and reactive to light and  accommodation. Extraocular tracking is good without limitation to gaze excursion or nystagmus noted. Normal smooth pursuit is noted. Hearing is grossly intact. Face is symmetric with normal facial animation and normal facial sensation. Speech is clear with no dysarthria noted. There is no hypophonia. There is no lip, neck/head, jaw or voice tremor. Neck is supple with full range of passive and active motion. There are no carotid bruits on auscultation. Oropharynx exam reveals: mild mouth dryness, adequate dental hygiene and moderate airway crowding. Mallampati is class II. Tongue protrudes centrally and palate elevates symmetrically.   Chest: Clear to auscultation without wheezing, rhonchi or crackles noted.  Heart: S1+S2+0, regular and normal without murmurs, rubs or gallops noted.   Abdomen: Soft, non-tender and non-distended with normal bowel sounds appreciated on auscultation.  Extremities: There is  no pitting edema in the distal lower extremities bilaterally.   Skin: Warm and dry without trophic changes noted. There are no varicose veins.  Musculoskeletal: exam reveals no obvious joint deformities, tenderness or joint swelling or erythema.   Neurologically:  Mental status: The patient is awake, alert and oriented in all 4 spheres. Her immediate and remote memory, attention, language skills and fund of knowledge are appropriate. There is no evidence of aphasia, agnosia, apraxia or anomia. Speech is clear with normal prosody and enunciation. Thought process is linear. Mood is normal and affect is normal.  Cranial nerves II - XII are as described above under HEENT exam. In addition: shoulder shrug is normal with equal shoulder height noted. Motor exam: Normal bulk, strength and tone is noted. There is no drift, tremor or rebound. Romberg is negative. Reflexes are 1-2+ throughout. Fine motor skills and coordination: intact with normal finger taps, normal hand movements, normal rapid  alternating patting, normal foot taps and normal foot agility.  Cerebellar testing: No dysmetria or intention tremor on finger to nose testing. Heel to shin is unremarkable bilaterally. There is no truncal or gait ataxia.  Sensory exam: intact to light touch in the upper and lower extremities.  Gait, station and balance: She stands easily. No veering to one side is noted. No leaning to one side is noted. Posture is age-appropriate and stance is narrow based. Gait shows normal stride length and normal pace. No problems turning are noted. Tandem walk is unremarkable.            Assessment and Plan:   In summary, CANDI PROFIT is a very pleasant 51 year old female  with an underlying medical history of hypertension, allergies, reflux disease and morbid obesity, who presents for follow-up consultation of her obstructive sleep apnea, after home sleep testing on 05/17/2016 confirmed obstructive sleep apnea, O2 nadir was 72%, AHI 13.8 per hour. Her insurance had denied an attended sleep study as well as a CPAP titration study. She has established treatment with AutoPap and is fully compliant with it. She reports very good results, feels improved, especially morning headaches are essentially gone which is great. Physical exam is stable with the exception of fluctuating weight and weight gain more apparent in the past 2 years. She is motivated to work on it. She is advised that I would like to proceed with an overnight pulse oximetry test as she did have significant desaturations noted during a home sleep test. To that end, we will ask her DME company to provide a pulse oximeter for overnight use and we will test for 1 night while she is using her AutoPap as usual. We will call her with the results. Otherwise, she can follow-up in 6 months, hopefully yearly thereafter. I answered all her questions today and we reviewed her home sleep test results as well as her compliance report together. She was in agreement  with the plan.  I spent 25 minutes in total face-to-face time with the patient, more than 50% of which was spent in counseling and coordination of care, reviewing test results, reviewing medication and discussing or reviewing the diagnosis of OSA, its prognosis and treatment options. Pertinent laboratory and imaging test results that were available during this visit with the patient were reviewed by me and considered in my medical decision making (see chart for details).

## 2016-08-10 NOTE — Patient Instructions (Addendum)
Please continue using your autoPAP regularly. While your insurance requires that you use PAP at least 4 hours each night on 70% of the nights, I recommend, that you not skip any nights and use it throughout the night if you can. Getting used to PAP and staying with the treatment long term does take time and patience and discipline. Untreated obstructive sleep apnea when it is moderate to severe can have an adverse impact on cardiovascular health and raise her risk for heart disease, arrhythmias, hypertension, congestive heart failure, stroke and diabetes. Untreated obstructive sleep apnea causes sleep disruption, nonrestorative sleep, and sleep deprivation. This can have an impact on your day to day functioning and cause daytime sleepiness and impairment of cognitive function, memory loss, mood disturbance, and problems focussing. Using PAP regularly can improve these symptoms. As discussed we will do an overnight oxygen level test, called ONO, and your DME company will call and set this up for one night, while you also use your autoPAP. We will call you with the results. This is to make sure that your oxygen levels stay in the 90s, while you are treated with autoPAP for your OSA. Keep up the good work! I will see you back in 6 months for sleep apnea check up, and if you continue to do well on CPAP I will see you once a year thereafter.

## 2016-08-24 ENCOUNTER — Encounter: Payer: Self-pay | Admitting: Neurology

## 2016-08-24 DIAGNOSIS — G4733 Obstructive sleep apnea (adult) (pediatric): Secondary | ICD-10-CM | POA: Diagnosis not present

## 2016-08-25 DIAGNOSIS — G4733 Obstructive sleep apnea (adult) (pediatric): Secondary | ICD-10-CM | POA: Diagnosis not present

## 2016-08-26 ENCOUNTER — Telehealth: Payer: Self-pay | Admitting: Neurology

## 2016-08-26 NOTE — Telephone Encounter (Signed)
I reviewed the patient's ONO (overnight pulse oximetry report) from 08/24/16, while on room air and autoPAP, duration of 5 h 56 min. Her baseline oxygen saturation for the night was 97.3% and minimum oxygen saturation was 92%. Time below 88% saturation was 0 minutes. Based on the available data, it appears that the patient appropriately treated with autoPAP and does not have significant desaturations.  Please notify patient that test results look good.

## 2016-08-26 NOTE — Telephone Encounter (Signed)
I called pt and advised her that her ONO results look good and no changes will need to be made at this time to her auto pap. Pt verbalized understanding of results. Pt had no questions at this time but was encouraged to call back if questions arise.

## 2016-08-30 DIAGNOSIS — S1096XA Insect bite of unspecified part of neck, initial encounter: Secondary | ICD-10-CM | POA: Diagnosis not present

## 2016-08-30 DIAGNOSIS — R21 Rash and other nonspecific skin eruption: Secondary | ICD-10-CM | POA: Diagnosis not present

## 2016-09-01 ENCOUNTER — Telehealth: Payer: Self-pay | Admitting: Family Medicine

## 2016-09-01 MED ORDER — CETIRIZINE HCL 10 MG PO TABS: 10.0000 mg | ORAL_TABLET | Freq: Every day | ORAL | 1 refills | Status: DC

## 2016-09-01 NOTE — Telephone Encounter (Signed)
Pt called because she said that the pharmacy has been faxing us for 2 weeks to refill her Zyrtec and we haven't done this yet. Can we get this sent in for her ASAP. jw

## 2016-09-01 NOTE — Telephone Encounter (Signed)
Have note received fax for Zyrtec. Will be happy to refill. Refill sent electronically to patient's pharmacy. Thank you.   Oretha Weismann, MD [facility], PGY-2

## 2016-09-16 ENCOUNTER — Other Ambulatory Visit: Payer: Self-pay | Admitting: *Deleted

## 2016-09-16 MED ORDER — OMEPRAZOLE 20 MG PO CPDR: 20.0000 mg | DELAYED_RELEASE_CAPSULE | Freq: Every day | ORAL | 0 refills | Status: DC

## 2016-09-24 DIAGNOSIS — H35361 Drusen (degenerative) of macula, right eye: Secondary | ICD-10-CM | POA: Diagnosis not present

## 2016-09-24 DIAGNOSIS — H524 Presbyopia: Secondary | ICD-10-CM | POA: Diagnosis not present

## 2016-09-24 DIAGNOSIS — H52202 Unspecified astigmatism, left eye: Secondary | ICD-10-CM | POA: Diagnosis not present

## 2016-11-03 DIAGNOSIS — G4733 Obstructive sleep apnea (adult) (pediatric): Secondary | ICD-10-CM | POA: Diagnosis not present

## 2016-12-04 DIAGNOSIS — G4733 Obstructive sleep apnea (adult) (pediatric): Secondary | ICD-10-CM | POA: Diagnosis not present

## 2016-12-13 ENCOUNTER — Other Ambulatory Visit: Payer: Self-pay | Admitting: Family Medicine

## 2016-12-22 DIAGNOSIS — G4733 Obstructive sleep apnea (adult) (pediatric): Secondary | ICD-10-CM | POA: Diagnosis not present

## 2017-01-03 DIAGNOSIS — G4733 Obstructive sleep apnea (adult) (pediatric): Secondary | ICD-10-CM | POA: Diagnosis not present

## 2017-01-26 DIAGNOSIS — J209 Acute bronchitis, unspecified: Secondary | ICD-10-CM | POA: Diagnosis not present

## 2017-01-26 DIAGNOSIS — J040 Acute laryngitis: Secondary | ICD-10-CM | POA: Diagnosis not present

## 2017-02-03 DIAGNOSIS — G4733 Obstructive sleep apnea (adult) (pediatric): Secondary | ICD-10-CM | POA: Diagnosis not present

## 2017-02-10 ENCOUNTER — Ambulatory Visit: Payer: BLUE CROSS/BLUE SHIELD | Admitting: Neurology

## 2017-02-10 ENCOUNTER — Other Ambulatory Visit: Payer: Self-pay | Admitting: Family Medicine

## 2017-02-10 ENCOUNTER — Encounter: Payer: Self-pay | Admitting: Neurology

## 2017-02-10 ENCOUNTER — Encounter (INDEPENDENT_AMBULATORY_CARE_PROVIDER_SITE_OTHER): Payer: Self-pay

## 2017-02-10 VITALS — BP 139/85 | HR 72 | Ht 62.0 in | Wt 240.5 lb

## 2017-02-10 DIAGNOSIS — G4733 Obstructive sleep apnea (adult) (pediatric): Secondary | ICD-10-CM | POA: Diagnosis not present

## 2017-02-10 DIAGNOSIS — Z1231 Encounter for screening mammogram for malignant neoplasm of breast: Secondary | ICD-10-CM

## 2017-02-10 DIAGNOSIS — Z9989 Dependence on other enabling machines and devices: Secondary | ICD-10-CM | POA: Diagnosis not present

## 2017-02-10 NOTE — Progress Notes (Signed)
Subjective:    [name] ID: Amanda [name] is a 51 y.o. female.  HPI     Interim history:   Amanda [name] is a 51-year-old right-handed woman with an underlying medical history of hypertension, allergies, reflux disease, vitamin D deficiency, and morbid obesity, who presents for follow-up consultation of her sleep disorder, after home sleep study testing and trial of AutoPap therapy. Amanda [name] is unaccompanied today. I last saw her on 08/10/2016, at which time Amanda [name] reported doing much better after starting AutoPap therapy. Amanda [name] felt that her sleep quality and sleep consolidation as well as daytime energy were improved as well as her morning headaches. Amanda [name] was motivated to work on her weight loss and continue with AutoPap. I suggested we proceed with an overnight pulse oximetry test to monitor oxygen saturations for 1 night. Amanda [name] had a pulse ox on 08/24/2016 with a total duration of 5 hours and 56 minutes, average oxygen saturation of 97.3%, nadir of 92%.  Today, 02/10/2017: I reviewed her AutoPap compliance data from 01/10/2017 through 02/08/2017 which is a total of 30 days, during which time Amanda [name] used her machine every night with percent used days greater than 4 hours at 90%, indicating excellent compliance with an average usage of 7 hours and 34 minutes, residual AHI at goal at 1.6 per hour, pressure for Amanda 95th percentile right at 12 cm, leak very low, pressure setting of 4-13 cm with EPR. Amanda [name] reports doing well, will start going to Amanda gym. Had a bout of bronchitis, went to UC and had a Zpack. Could not use Amanda machine then. Otherwise doing well. Would be okay trying CPAP of 12 cm, using nasal mask.   Amanda [name]'s allergies, current medications, family history, past medical history, past social history, past surgical history and problem list were reviewed and updated as appropriate.    Previously (copied from previous notes for reference):   I first met her on 11/20/2015 at Amanda request of  her primary care physician, at which time Amanda [name] reported loud snoring, daytime somnolence and witnessed apneas. I invited her for sleep study. Her insurance denied a attended sleep study. Amanda [name] had a home sleep test on 05/17/2016 which showed an AHI of 13.8 per hour, average oxygen saturation of 93%, nadir of 72%. I suggested Amanda [name] return for CPAP titration. This was denied by her insurance. I started her on AutoPap therapy.   I reviewed her AutoPap compliance data from 07/10/2016 through 08/08/2016 which is a total of 30 days, during which time Amanda [name] used her machine every night with percent used days greater than 4 hours at 97%, indicating excellent compliance with an average usage of 7 hours and 16 minutes, residual AHI 2.2 per hour, 95th percentile pressure at 12.5 cm, leak low with Amanda 95th percentile at 1.1 L/m on a pressure of 4-13 with EPR.    11/20/2015: Amanda [name] reports snoring and excessive daytime somnolence. In addition, Amanda [name] reports morning headaches and witnessed apneas, per BF, cannot sleep in Amanda same bed with her d/t her loud snoring. Amanda [name] has woken up with SOB and has to sit up. Amanda [name] has GER at night. I reviewed your office note from 10/31/2015. Her Epworth sleepiness score is 18 out of 24 today, her fatigue score is 52/63. Amanda [name] works 1st shift, but 12 hour shifts: 8 A to 8 P, 2 days on, 2 off, and every other WE: Fri/Sat/Sun. Amanda [name] has frequent AM headaches: dull achy, generalized.  Amanda [name] has nocturia about twice per night on average.   Bedtime is around 10:30 to 11, wakeup time is around 6 AM. Amanda [name] does not typically wake up rested. Amanda [name] lives with her younger son, Amanda [name] has another older son who lives about 15 minutes away. Amanda [name] does not smoke or drink alcohol and denies illicit drug use, Amanda [name] drinks caffeine occasionally, maybe twice a week. Her older sister and her older brother both have sleep apnea and uses CPAP machines.  Her Past Medical History Is Significant For: Past Medical History:  Diagnosis Date   . GERD (gastroesophageal reflux disease) 1993  . Hypertension 1993  . Vitamin D deficiency 2011    Her Past Surgical History Is Significant For: Past Surgical History:  Procedure Laterality Date  . ABDOMINAL HYSTERECTOMY    . CHOLECYSTECTOMY, LAPAROSCOPIC    . TUBAL LIGATION      Her Family History Is Significant For: Family History  Problem Relation Age of Onset  . Hypertension Mother   . Lupus Mother   . Hypertension Father   . Diabetes Father   . Heart disease Father   . Prostate cancer Father   . Asthma Brother   . Asthma Sister   . Cancer Sister        breast cancer   . Diabetes Sister   . Diabetes Brother   . Hypertension Sister   . Hypertension Brother     Her Social History Is Significant For: Social History   Socioeconomic History  . Marital status: Widowed    Spouse name: None  . Number of children: 2  . Years of education: GED  . Highest education level: None  Social Needs  . Financial resource strain: None  . Food insecurity - worry: None  . Food insecurity - inability: None  . Transportation needs - medical: None  . Transportation needs - non-medical: None  Occupational History  . Occupation: Glass blower/designer  Tobacco Use  . Smoking status: Never Smoker  . Smokeless tobacco: Never Used  Substance and Sexual Activity  . Alcohol use: No  . Drug use: No  . Sexual activity: None  Other Topics Concern  . None  Social History Narrative   Lives in [place] with her sister. Amanda [name] works at [employer] living.    Drinks about 2 caffeine drinks a week     Her Allergies Are:  No Known Allergies:   Her Current Medications Are:  Outpatient Encounter Medications as of 02/10/2017  Medication Sig  . cetirizine (ZYRTEC) 10 MG tablet Take 1 tablet (10 mg total) by mouth daily.  Marland Kitchen losartan (COZAAR) 100 MG tablet Take 1 tablet (100 mg total) by mouth daily.  Marland Kitchen omeprazole (PRILOSEC) 20 MG capsule TAKE 1 CAPSULE BY MOUTH ONCE DAILY   No  facility-administered encounter medications on file as of 02/10/2017.   :  Review of Systems:  Out of a complete 14 point review of systems, all are reviewed and negative with Amanda exception of these symptoms as listed below:  Review of Systems  Neurological:       [name] says that Amanda [name] has been doing fine with her CPAP, however, about a week ago Amanda [name] had bronchitis and was unable to wear Amanda CPAP well.     Objective:  Neurological Exam  Physical Exam Physical Examination:   Vitals:   02/10/17 0904  BP: 139/85  Pulse: 72    General Examination: Amanda [name] is a very pleasant 51 y.o. female in no acute distress. Amanda [name] appears well-developed and well-nourished and well groomed.  HEENT:Normocephalic, atraumatic, pupils are equal, round and reactive to light and accommodation. Extraocular tracking is good without limitation to gaze excursion or nystagmus noted. Normal smooth pursuit is noted. Hearing is grossly intact. Face is symmetric with normal facial animation and normal facial sensation. Speech is clear with no dysarthria noted. There is no hypophonia. There is no lip, neck/head, jaw or voice tremor. Neck is supple with full range of passive and active motion. Oropharynx exam reveals: mildmouth dryness, adequatedental hygiene and moderateairway crowding. Mallampati is class II. Tongue protrudes centrally and palate elevates symmetrically.   Chest:Clear to auscultation without wheezing, rhonchi or crackles noted.  Heart:S1+S2+0, regular and normal without murmurs, rubs or gallops noted.   Abdomen:Soft, non-tender and non-distended with normal bowel sounds appreciated on auscultation.  Extremities:There is nopitting edema in Amanda distal lower extremities bilaterally.   Skin: Warm and dry without trophic changes noted. There are no varicose veins.  Musculoskeletal: exam reveals no obvious joint deformities, tenderness or joint swelling or erythema.   Neurologically:   Mental status: Amanda [name] is awake, alert and oriented in all 4 spheres. Herimmediate and remote memory, attention, language skills and fund of knowledge are appropriate. There is no evidence of aphasia, agnosia, apraxia or anomia. Speech is clear with normal prosody and enunciation. Thought process is linear. Mood is normaland affect is normal.  Cranial nerves II - XII are as described above under HEENT exam. In addition: shoulder shrug is normal with equal shoulder height noted. Motor exam: Normal bulk, strength and tone is noted. There is no drift, tremor or rebound. Romberg is negative. Reflexes are 1-2+ throughout. Fine motor skills and coordination: intact grossly. taps, normal hand movements, normal rapid alternating patting, normal foot taps and normal foot agility.  Cerebellar testing: No dysmetria or intention tremor. There is no truncal or gait ataxia.  Sensory exam: intact to light touch in Amanda upper and lower extremities.  Gait, station and balance: Amanda Scottstands easily. No veering to one side is noted. No leaning to one side is noted. Posture is age-appropriate and stance is narrow based. Gait shows normalstride length and normalpace. No problems turning are noted.Tandem walk is unremarkable.   Assessment and Plan:   In summary, Amanda Scottis a very pleasant 51-year old female with an underlying medical history of hypertension, allergies, reflux disease and morbid obesity,who presents for follow-up consultation of her obstructive sleep apnea, after home sleep testing on 05/17/2016 confirmed obstructive sleep apnea, O2 nadir was 72%, AHI 13.8 per hour. Her insurance had denied an attended sleep study as well as a CPAP titration study. Amanda [name] has established treatment with AutoPap and is fully compliant with it. Amanda [name] reports ongoing good results, feels improved, especially morning headaches are essentially gone which has been great. Physical exam is stable with Amanda  exception of fluctuating weight and weight gain more apparent in Amanda past 2 years. Amanda [name] is motivated to work on it and will start going to Amanda gym. We did an overnight pulse oximetry test in May 2018, which was fine, while Amanda [name] was using her autoPAP. Amanda [name] did have significant oxygen desaturations noted during her home sleep test. Amanda [name] is advised to consider trying CPAP at a pressure of 12 cm, which would give her a more steady pressure. Amanda [name] is agreeable. To that end, I will change her from AutoPAP to CPAP. Amanda [name] is advised to FU in one year. I answered all her questions today and we reviewed her home sleep test results as well as her compliance   report together. Amanda [name] was in agreement with Amanda plan.     

## 2017-02-10 NOTE — Patient Instructions (Signed)
Keep up the good work! We will see you in a year.  We will change your settings of the machine from autoPAP to CPAP of 12 cm, for a more steady pressure.  Keep trying to work on your weight loss.

## 2017-02-28 ENCOUNTER — Other Ambulatory Visit: Payer: Self-pay | Admitting: Family Medicine

## 2017-03-05 DIAGNOSIS — G4733 Obstructive sleep apnea (adult) (pediatric): Secondary | ICD-10-CM | POA: Diagnosis not present

## 2017-03-10 ENCOUNTER — Ambulatory Visit
Admission: RE | Admit: 2017-03-10 | Discharge: 2017-03-10 | Disposition: A | Discharge status: Home / Self Care | Payer: BLUE CROSS/BLUE SHIELD | Source: Ambulatory Visit | Attending: Family Medicine | Admitting: Family Medicine

## 2017-03-10 DIAGNOSIS — Z1231 Encounter for screening mammogram for malignant neoplasm of breast: Secondary | ICD-10-CM | POA: Diagnosis not present

## 2017-03-11 ENCOUNTER — Other Ambulatory Visit: Payer: Self-pay | Admitting: Family Medicine

## 2017-03-11 DIAGNOSIS — R928 Other abnormal and inconclusive findings on diagnostic imaging of breast: Secondary | ICD-10-CM

## 2017-03-16 ENCOUNTER — Ambulatory Visit: Payer: BLUE CROSS/BLUE SHIELD

## 2017-03-16 ENCOUNTER — Ambulatory Visit
Admission: RE | Admit: 2017-03-16 | Discharge: 2017-03-16 | Disposition: A | Discharge status: Home / Self Care | Payer: BLUE CROSS/BLUE SHIELD | Source: Ambulatory Visit | Attending: Family Medicine | Admitting: Family Medicine

## 2017-03-16 DIAGNOSIS — R928 Other abnormal and inconclusive findings on diagnostic imaging of breast: Secondary | ICD-10-CM

## 2017-03-16 DIAGNOSIS — R922 Inconclusive mammogram: Secondary | ICD-10-CM | POA: Diagnosis not present

## 2017-04-05 DIAGNOSIS — G4733 Obstructive sleep apnea (adult) (pediatric): Secondary | ICD-10-CM | POA: Diagnosis not present

## 2017-04-14 ENCOUNTER — Encounter: Payer: Self-pay | Admitting: Nurse Practitioner

## 2017-04-14 ENCOUNTER — Ambulatory Visit: Payer: BLUE CROSS/BLUE SHIELD | Admitting: Nurse Practitioner

## 2017-04-14 VITALS — BP 134/88 | HR 74 | Temp 98.5°F | Resp 16 | Ht 62.0 in | Wt 235.0 lb

## 2017-04-14 DIAGNOSIS — K219 Gastro-esophageal reflux disease without esophagitis: Secondary | ICD-10-CM | POA: Diagnosis not present

## 2017-04-14 DIAGNOSIS — I10 Essential (primary) hypertension: Secondary | ICD-10-CM | POA: Diagnosis not present

## 2017-04-14 DIAGNOSIS — J3089 Other allergic rhinitis: Secondary | ICD-10-CM | POA: Insufficient documentation

## 2017-04-14 NOTE — Progress Notes (Signed)
Name: Amanda [name]   MRN: [id number]    DOB: June 03, 1965   Date:04/14/2017       Progress Note  Subjective  Chief Complaint  Chief Complaint  Patient presents with  . Establish Care    HPI Ms Adelsberger presents today to establish care and requests refills of her daily medications.  Hypertension -maintained on losartan 100 daily  Reports daily medication compliance without adverse medication effects. Reports she does not check her blood pressure regularly. Denies headaches, vision changes, chest pain, shortness of breath, edema.  BP Readings from Last 3 Encounters:  04/14/17 134/88  02/10/17 139/85  08/10/16 110/78   Allergic rhinitis-maintained on zyrtec daily She experiences itchy watery eyes and sneezing year round if she does not take zyrtec. Her symptoms are relieved with zyrtec.  GERD- maintained on prilosec 20 daily. She has been on prilosec for several years. She experiences heartburn, nausea if she goes for more than one day without the prilosec. She experiences full relief with the prilosec. She also avoids foods that cause GERD symptoms.  Patient Active Problem List   Diagnosis Date Noted  . Moderate obstructive sleep apnea 05/20/2016  . Headache 05/14/2016  . Excessive daytime sleepiness 11/02/2015  . Abdominal pain 06/05/2015  . Abdominal pain, epigastric 06/05/2015  . Shortness of breath 06/12/2014  . Recurrent sinus infections 06/12/2014  . Chest pain, atypical 01/03/2014  . GERD (gastroesophageal reflux disease) 03/08/2012  . Benign essential hypertension 03/08/2012    Past Surgical History:  Procedure Laterality Date  . ABDOMINAL HYSTERECTOMY    . CHOLECYSTECTOMY    . CHOLECYSTECTOMY, LAPAROSCOPIC    . TUBAL LIGATION      Family History  Problem Relation Age of Onset  . Hypertension Mother   . Lupus Mother   . Hypertension Father   . Diabetes Father   . Heart disease Father   . Prostate cancer Father   . Asthma Brother   . Asthma  Sister   . Cancer Sister        breast cancer   . Breast cancer Sister 84  . Diabetes Sister   . Diabetes Brother   . Hypertension Sister   . Hypertension Brother     Social History   Socioeconomic History  . Marital status: Widowed    Spouse name: Not on file  . Number of children: 2  . Years of education: GED  . Highest education level: Not on file  Social Needs  . Financial resource strain: Not on file  . Food insecurity - worry: Not on file  . Food insecurity - inability: Not on file  . Transportation needs - medical: Not on file  . Transportation needs - non-medical: Not on file  Occupational History  . Occupation: Location manager  Tobacco Use  . Smoking status: Never Smoker  . Smokeless tobacco: Never Used  Substance and Sexual Activity  . Alcohol use: No  . Drug use: No  . Sexual activity: Not on file  Other Topics Concern  . Not on file  Social History Narrative   Lives in [place] with her sister. She works at [employer] living.    Drinks about 2 caffeine drinks a week      Current Outpatient Medications:  .  cetirizine (ZYRTEC) 10 MG tablet, TAKE 1 TABLET BY MOUTH ONCE DAILY, Disp: 90 tablet, Rfl: 1 .  losartan (COZAAR) 100 MG tablet, Take 1 tablet (100 mg total) by mouth daily., Disp: 90 tablet, Rfl: 3 .  omeprazole (PRILOSEC) 20 MG capsule, TAKE 1 CAPSULE BY MOUTH ONCE DAILY, Disp: 90 capsule, Rfl: 0  No Known Allergies   Review of Systems  Constitutional: Negative for chills, fever and weight loss.  HENT: Negative for hearing loss and sore throat.   Eyes: Negative for blurred vision and double vision.  Respiratory: Negative for cough and shortness of breath.   Cardiovascular: Negative for chest pain and palpitations.  Gastrointestinal: Negative for abdominal pain and heartburn.  Genitourinary: Negative for dysuria and hematuria.  Musculoskeletal: Negative for falls, joint pain and myalgias.  Skin: Negative for rash.  Neurological:  Negative for speech change and headaches.  Endo/Heme/Allergies: Negative for environmental allergies. Does not bruise/bleed easily.  Psychiatric/Behavioral: Negative for depression. The patient is nervous/anxious.    Objective  Vitals:   04/14/17 1402  BP: 134/88  Pulse: 74  Resp: 16  Temp: 98.5 F (36.9 C)  TempSrc: Oral  SpO2: 97%  Weight: 235 lb (106.6 kg)  Height: 5\' 2"  (1.575 m)   Body mass index is 42.98 kg/m.  Physical Exam Constitutional: Patient appears well-developed and well-nourished. No distress.  HENT: Head: Normocephalic and atraumatic. Nose: Nose normal. Mouth/Throat: Oropharynx is clear and moist. No oropharyngeal exudate.  Eyes: Conjunctivae and EOM are normal. Pupils are equal, round, and reactive to light. No scleral icterus.  Neck: Normal range of motion. Neck supple. Cardiovascular: Normal rate, regular rhythm and normal heart sounds.  No murmur heard. No BLE edema. Pulmonary/Chest: Effort normal and breath sounds normal. No respiratory distress. Abdominal: Soft. No distension. Musculoskeletal: Normal range of motion, no joint effusions. No gross deformities Neurological: he is alert and oriented to person, place, and time. Coordination, balance, strength, speech and gait are normal.  Skin: Skin is warm and dry. No rash noted. No erythema.  Psychiatric: Patient has a normal mood and affect. behavior is normal. Judgment and thought content normal.  PHQ2/9: Depression screen [facility] 2/9 06/17/2016 05/31/2016 05/10/2016 10/31/2015 06/19/2015  Decreased Interest 0 0 0 0 0  Down, Depressed, Hopeless 0 0 0 0 0  PHQ - 2 Score 0 0 0 0 0   Fall Risk: Fall Risk  05/10/2016 01/03/2014 08/30/2013  Falls in the past year? No No No    Assessment & Plan RTC for CPE or sooner if needed.

## 2017-04-14 NOTE — Assessment & Plan Note (Signed)
Stable, continue zyrtec.  

## 2017-04-14 NOTE — Assessment & Plan Note (Signed)
Stable, continue prilosec She was supposed to continue with annual GI follow up per [facility] on 11/19/2015. She did not follow up due to loss of insurance coverage. She declines referral to return to GI today, she feels like her GERD is stable. She agrees to return to GI if she experiences any recurrence of symptoms, she will let me know.

## 2017-04-14 NOTE — Patient Instructions (Addendum)
Please let me know when you need refills of your medications.  Please return for an annual physical at your convenience, or sooner if you need me.  It was nice to meet you. Welcome to [facility]!   Heart-Healthy Eating Plan Heart-healthy meal planning includes:  Limiting unhealthy fats.  Increasing healthy fats.  Making other small dietary changes.  You may need to talk with your doctor or a diet specialist (dietitian) to create an eating plan that is right for you. What types of fat should I choose?  Choose healthy fats. These include olive oil and canola oil, flaxseeds, walnuts, almonds, and seeds.  Eat more omega-3 fats. These include salmon, mackerel, sardines, tuna, flaxseed oil, and ground flaxseeds. Try to eat fish at least twice each week.  Limit saturated fats. ? Saturated fats are often found in animal products, such as meats, butter, and cream. ? Plant sources of saturated fats include palm oil, palm kernel oil, and coconut oil.  Avoid foods with partially hydrogenated oils in them. These include stick margarine, some tub margarines, cookies, crackers, and other baked goods. These contain trans fats. What general guidelines do I need to follow?  Check food labels carefully. Identify foods with trans fats or high amounts of saturated fat.  Fill one half of your plate with vegetables and green salads. Eat 4-5 servings of vegetables per day. A serving of vegetables is: ? 1 cup of raw leafy vegetables. ?  cup of raw or cooked cut-up vegetables. ?  cup of vegetable juice.  Fill one fourth of your plate with whole grains. Look for the word "whole" as the first word in the ingredient list.  Fill one fourth of your plate with lean protein foods.  Eat 4-5 servings of fruit per day. A serving of fruit is: ? One medium whole fruit. ?  cup of dried fruit. ?  cup of fresh, frozen, or canned fruit. ?  cup of 100% fruit juice.  Eat more foods that contain soluble fiber.  These include apples, broccoli, carrots, beans, peas, and barley. Try to get 20-30 g of fiber per day.  Eat more home-cooked food. Eat less restaurant, buffet, and fast food.  Limit or avoid alcohol.  Limit foods high in starch and sugar.  Avoid fried foods.  Avoid frying your food. Try baking, boiling, grilling, or broiling it instead. You can also reduce fat by: ? Removing the skin from poultry. ? Removing all visible fats from meats. ? Skimming the fat off of stews, soups, and gravies before serving them. ? Steaming vegetables in water or broth.  Lose weight if you are overweight.  Eat 4-5 servings of nuts, legumes, and seeds per week: ? One serving of dried beans or legumes equals  cup after being cooked. ? One serving of nuts equals 1 ounces. ? One serving of seeds equals  ounce or one tablespoon.  You may need to keep track of how much salt or sodium you eat. This is especially true if you have high blood pressure. Talk with your doctor or dietitian to get more information. What foods can I eat? Grains Breads, including [place], white, pita, wheat, raisin, rye, oatmeal, and [place]. Tortillas that are neither fried nor made with lard or trans fat. Low-fat rolls, including hotdog and hamburger buns and English muffins. Biscuits. Muffins. Waffles. Pancakes. Light popcorn. Whole-grain cereals. Flatbread. Melba toast. Pretzels. Breadsticks. Rusks. Low-fat snacks. Low-fat crackers, including oyster, saltine, matzo, graham, animal, and rye. Rice and pasta, including brown  rice and pastas that are made with whole wheat. Vegetables All vegetables. Fruits All fruits, but limit coconut. Meats and Other Protein Sources Lean, well-trimmed beef, veal, pork, and lamb. Chicken and [place] without skin. All fish and shellfish. Wild duck, rabbit, pheasant, and venison. Egg whites or low-cholesterol egg substitutes. Dried beans, peas, lentils, and tofu. Seeds and most nuts. Dairy Low-fat or  nonfat cheeses, including ricotta, string, and mozzarella. Skim or 1% milk that is liquid, powdered, or evaporated. Buttermilk that is made with low-fat milk. Nonfat or low-fat yogurt. Beverages Mineral water. Diet carbonated beverages. Sweets and Desserts Sherbets and fruit ices. Honey, jam, marmalade, jelly, and syrups. Meringues and gelatins. Pure sugar candy, such as hard candy, jelly beans, gumdrops, mints, marshmallows, and small amounts of dark chocolate. [employer]. Eat all sweets and desserts in moderation. Fats and Oils Nonhydrogenated (trans-free) margarines. Vegetable oils, including soybean, sesame, sunflower, olive, peanut, safflower, corn, canola, and cottonseed. Salad dressings or mayonnaise made with a vegetable oil. Limit added fats and oils that you use for cooking, baking, salads, and as spreads. Other Cocoa powder. Coffee and tea. All seasonings and condiments. The items listed above may not be a complete list of recommended foods or beverages. Contact your dietitian for more options. What foods are not recommended? Grains Breads that are made with saturated or trans fats, oils, or whole milk. Croissants. Butter rolls. Cheese breads. Sweet rolls. Donuts. Buttered popcorn. Chow mein noodles. High-fat crackers, such as cheese or butter crackers. Meats and Other Protein Sources Fatty meats, such as hotdogs, short ribs, sausage, spareribs, bacon, rib eye roast or steak, and mutton. High-fat deli meats, such as salami and bologna. Caviar. Domestic duck and goose. Organ meats, such as kidney, liver, sweetbreads, and heart. Dairy Cream, sour cream, cream cheese, and creamed cottage cheese. Whole-milk cheeses, including blue (bleu), [address], [place], [place], [address], [place], [address], cheddar, [place], and [place]. Whole or 2% milk that is liquid, evaporated, or condensed. Whole buttermilk. Cream sauce or high-fat cheese sauce. Yogurt that is made from whole  milk. Beverages Regular sodas and juice drinks with added sugar. Sweets and Desserts Frosting. Pudding. Cookies. Cakes other than angel food cake. Candy that has milk chocolate or white chocolate, hydrogenated fat, butter, coconut, or unknown ingredients. Buttered syrups. Full-fat ice cream or ice cream drinks. Fats and Oils Gravy that has suet, meat fat, or shortening. Cocoa butter, hydrogenated oils, palm oil, coconut oil, palm kernel oil. These can often be found in baked products, candy, fried foods, nondairy creamers, and whipped toppings. Solid fats and shortenings, including bacon fat, salt pork, lard, and butter. Nondairy cream substitutes, such as coffee creamers and sour cream substitutes. Salad dressings that are made of unknown oils, cheese, or sour cream. The items listed above may not be a complete list of foods and beverages to avoid. Contact your dietitian for more information. This information is not intended to replace advice given to you by your health care provider. Make sure you discuss any questions you have with your health care provider. Document Released: 09/14/2011 Document Revised: 08/21/2015 Document Reviewed: 09/06/2013 Elsevier Interactive Patient Education  [employer].

## 2017-04-14 NOTE — Assessment & Plan Note (Signed)
Stable, continue losartan Discussed healthy diet and exercise in the maintenance of healthy blood pressure Pt education handout given.

## 2017-05-20 ENCOUNTER — Ambulatory Visit (INDEPENDENT_AMBULATORY_CARE_PROVIDER_SITE_OTHER): Payer: BLUE CROSS/BLUE SHIELD | Admitting: Nurse Practitioner

## 2017-05-20 ENCOUNTER — Other Ambulatory Visit (INDEPENDENT_AMBULATORY_CARE_PROVIDER_SITE_OTHER): Payer: BLUE CROSS/BLUE SHIELD

## 2017-05-20 ENCOUNTER — Encounter: Payer: Self-pay | Admitting: Nurse Practitioner

## 2017-05-20 VITALS — BP 140/84 | HR 79 | Temp 98.6°F | Resp 16 | Ht 62.0 in | Wt 230.0 lb

## 2017-05-20 DIAGNOSIS — Z114 Encounter for screening for human immunodeficiency virus [HIV]: Secondary | ICD-10-CM

## 2017-05-20 DIAGNOSIS — R32 Unspecified urinary incontinence: Secondary | ICD-10-CM

## 2017-05-20 DIAGNOSIS — Z6841 Body Mass Index (BMI) 40.0 and over, adult: Secondary | ICD-10-CM

## 2017-05-20 DIAGNOSIS — I10 Essential (primary) hypertension: Secondary | ICD-10-CM | POA: Diagnosis not present

## 2017-05-20 DIAGNOSIS — R42 Dizziness and giddiness: Secondary | ICD-10-CM

## 2017-05-20 DIAGNOSIS — Z13220 Encounter for screening for lipoid disorders: Secondary | ICD-10-CM

## 2017-05-20 DIAGNOSIS — Z23 Encounter for immunization: Secondary | ICD-10-CM | POA: Diagnosis not present

## 2017-05-20 DIAGNOSIS — Z0001 Encounter for general adult medical examination with abnormal findings: Secondary | ICD-10-CM

## 2017-05-20 DIAGNOSIS — R079 Chest pain, unspecified: Secondary | ICD-10-CM | POA: Diagnosis not present

## 2017-05-20 LAB — LIPID PANEL
CHOLESTEROL: 218 mg/dL — AB (ref 0–200)
HDL: 47.7 mg/dL (ref >39.00)
LDL Cholesterol: 154 mg/dL — ABNORMAL HIGH (ref 0–99)
NONHDL: 170.39
TRIGLYCERIDES: 83 mg/dL (ref 0.0–149.0)
Total CHOL/HDL Ratio: 5
VLDL: 16.6 mg/dL (ref 0.0–40.0)

## 2017-05-20 LAB — CBC WITH DIFFERENTIAL/PLATELET
BASOS PCT: 0.9 % (ref 0.0–3.0)
Basophils Absolute: 0.1 10*3/uL (ref 0.0–0.1)
EOS ABS: 0.1 10*3/uL (ref 0.0–0.7)
EOS PCT: 2.1 % (ref 0.0–5.0)
HEMATOCRIT: 39.3 % (ref 36.0–46.0)
HEMOGLOBIN: 13.2 g/dL (ref 12.0–15.0)
LYMPHS PCT: 39.8 % (ref 12.0–46.0)
Lymphs Abs: 2.6 10*3/uL (ref 0.7–4.0)
MCHC: 33.5 g/dL (ref 30.0–36.0)
MCV: 88.5 fl (ref 78.0–100.0)
Monocytes Absolute: 0.4 10*3/uL (ref 0.1–1.0)
Monocytes Relative: 6.4 % (ref 3.0–12.0)
Neutro Abs: 3.4 10*3/uL (ref 1.4–7.7)
Neutrophils Relative %: 50.8 % (ref 43.0–77.0)
Platelets: 277 10*3/uL (ref 150.0–400.0)
RBC: 4.45 Mil/uL (ref 3.87–5.11)
RDW: 13.5 % (ref 11.5–15.5)
WBC: 6.6 10*3/uL (ref 4.0–10.5)

## 2017-05-20 LAB — COMPREHENSIVE METABOLIC PANEL
ALBUMIN: 4.1 g/dL (ref 3.5–5.2)
ALK PHOS: 57 U/L (ref 39–117)
ALT: 25 U/L (ref 0–35)
AST: 21 U/L (ref 0–37)
BILIRUBIN TOTAL: 0.6 mg/dL (ref 0.2–1.2)
BUN: 11 mg/dL (ref 6–23)
CALCIUM: 9.3 mg/dL (ref 8.4–10.5)
CO2: 28 mEq/L (ref 19–32)
Chloride: 107 mEq/L (ref 96–112)
Creatinine, Ser: 0.88 mg/dL (ref 0.40–1.20)
GFR: 72.45 mL/min (ref >60.00)
Glucose, Bld: 90 mg/dL (ref 70–99)
Potassium: 3.5 mEq/L (ref 3.5–5.1)
Sodium: 141 mEq/L (ref 135–145)
TOTAL PROTEIN: 7.1 g/dL (ref 6.0–8.3)

## 2017-05-20 LAB — TSH: TSH: 1.63 u[IU]/mL (ref 0.35–4.50)

## 2017-05-20 LAB — HEMOGLOBIN A1C: Hgb A1c MFr Bld: 5.7 % (ref 4.6–6.5)

## 2017-05-20 MED ORDER — LOSARTAN POTASSIUM 100 MG PO TABS: 100.0000 mg | ORAL_TABLET | Freq: Every day | ORAL | 3 refills | Status: DC

## 2017-05-20 NOTE — Assessment & Plan Note (Addendum)
-  USPSTF grade A and B recommendations reviewed with patient; age-appropriate recommendations, preventive care, screening tests, etc discussed and encouraged; healthy living encouraged; see AVS for patient education given to patient -Discussed importance of 150 minutes of physical activity weekly , eat 6 servings of fruit/vegetables daily and drink plenty of water and avoid sweet beverages.  -Red flags and when to present for emergency care or RTC including fever >101.53F, chest pain, shortness of breath, new/worsening/un-resolving symptoms,reviewed with patient at time of visit. Follow up and care instructions discussed and provided in AVS. -Reviewed Health Maintenance: TDAP today, HIV screening today - Lipid panel; Future-Screening for cholesterol level - HIV antibody; Future-Screening for HIV (human immunodeficiency virus)  Class 3 severe obesity due to excess calories with body mass index (BMI) of 40.0 to 44.9 in adult, unspecified whether serious comorbidity present (HCC) Discussed the role of healthy diet and exercise in the management of weight - TSH; Future - Hemoglobin A1c; Future

## 2017-05-20 NOTE — Addendum Note (Signed)
Addended by: Mercer PodWRENN, Travius Crochet E on: 05/20/2017 01:21 PM   Modules accepted: Orders

## 2017-05-20 NOTE — Progress Notes (Signed)
Name: Amanda [name]   MRN: [id number]    DOB: 11/24/1965   Date:05/20/2017       Progress Note  Subjective  Chief Complaint  Chief Complaint  Patient presents with  . CPE    fasting    HPI  Patient presents for annual CPE.  Diet: Breakfast- often skips, peanut butter crackers, poptarts; Lunch- cafeteria meal, chips; Dinner- fast food, has little time to cook due to caring for parents; Snacks-powdered doughnuts; Drinks- water, sodas Exercise: no routine exercise   USPSTF grade A and B recommendations  Depression: She denies concerns for anxiety or depression. Depression screen [facility] 2/9 06/17/2016 05/31/2016 05/10/2016 10/31/2015 06/19/2015  Decreased Interest 0 0 0 0 0  Down, Depressed, Hopeless 0 0 0 0 0  PHQ - 2 Score 0 0 0 0 0   Hypertension: maintained on losartan BP Readings from Last 3 Encounters:  05/20/17 140/84  04/14/17 134/88  02/10/17 139/85   Obesity: Wt Readings from Last 3 Encounters:  05/20/17 230 lb (104.3 kg)  04/14/17 235 lb (106.6 kg)  02/10/17 240 lb 8 oz (109.1 kg)   BMI Readings from Last 3 Encounters:  05/20/17 42.07 kg/m  04/14/17 42.98 kg/m  02/10/17 43.99 kg/m    Alcohol: no Tobacco use: no HIV - will test today STD testing and prevention (chl/gon/syphilis): declines testing, monogamous with one partner  Intimate partner violence: denies Sexual History/Pain during Intercourse: no complaints Menstrual History/LMP/Abnormal Bleeding: previous hysterectomy, no vaginal discharge or bleeding Incontinence Symptoms: occasional urinary leakage, denies frequency or burning  Vaccinations: TDAP today  Advanced Care Planning: A voluntary discussion about advance care planning including the explanation and discussion of advance directives.  Discussed health care proxy and Living will, and the patient DOES NOT have a living will at present time. If patient does have living will, I have requested they bring this to the clinic to be scanned in to their  chart.    No results found for: HMMAMMO  Up to date- 03/16/17 Cervical cancer screening: previous hysterectomy   Lipids:  Lab Results  Component Value Date   CHOL 215 (H) 06/12/2014   Lab Results  Component Value Date   HDL 57 06/12/2014   Lab Results  Component Value Date   LDLCALC 130 (H) 06/12/2014   Lab Results  Component Value Date   TRIG 139 06/12/2014   Lab Results  Component Value Date   CHOLHDL 3.8 06/12/2014   No results found for: LDLDIRECT  Glucose:  Glucose, Bld  Date Value Ref Range Status  05/10/2016 97 65 - 99 mg/dL Final  78/29/5621 79 65 - 99 mg/dL Final  30/86/5784 696 (H) 70 - 99 mg/dL Final    Skin cancer: No concerns Colorectal cancer: n/a Lung cancer:  n/a Aspirin: not indicated ECG: Done today   Patient Active Problem List   Diagnosis Date Noted  . Non-seasonal allergic rhinitis 04/14/2017  . Moderate obstructive sleep apnea 05/20/2016  . Headache 05/14/2016  . Abdominal pain 06/05/2015  . Recurrent sinus infections 06/12/2014  . GERD (gastroesophageal reflux disease) 03/08/2012  . Benign essential hypertension 03/08/2012    Past Surgical History:  Procedure Laterality Date  . ABDOMINAL HYSTERECTOMY    . CHOLECYSTECTOMY    . CHOLECYSTECTOMY, LAPAROSCOPIC    . TUBAL LIGATION      Family History  Problem Relation Age of Onset  . Hypertension Mother   . Lupus Mother   . Hypertension Father   . Diabetes Father   . Heart  disease Father   . Prostate cancer Father   . Asthma Brother   . Asthma Sister   . Cancer Sister        breast cancer   . Breast cancer Sister [id number]  . Diabetes Sister   . Diabetes Brother   . Hypertension Sister   . Hypertension Brother     Social History   Socioeconomic History  . Marital status: Widowed    Spouse name: Not on file  . Number of children: 2  . Years of education: GED  . Highest education level: Not on file  Social Needs  . Financial resource strain: Not on file  . Food  insecurity - worry: Not on file  . Food insecurity - inability: Not on file  . Transportation needs - medical: Not on file  . Transportation needs - non-medical: Not on file  Occupational History  . Occupation: Location managerMachine Operator  Tobacco Use  . Smoking status: Never Smoker  . Smokeless tobacco: Never Used  Substance and Sexual Activity  . Alcohol use: No  . Drug use: No  . Sexual activity: Not on file  Other Topics Concern  . Not on file  Social History Narrative   [place] in [place]. She works at [employer].    Drinks about 2 caffeine drinks a week      Current Outpatient Medications:  .  cetirizine (ZYRTEC) 10 MG tablet, TAKE 1 TABLET BY MOUTH ONCE DAILY, Disp: 90 tablet, Rfl: 1 .  losartan (COZAAR) 100 MG tablet, Take 1 tablet (100 mg total) by mouth daily., Disp: 90 tablet, Rfl: 3 .  omeprazole (PRILOSEC) 20 MG capsule, TAKE 1 CAPSULE BY MOUTH ONCE DAILY, Disp: 90 capsule, Rfl: 0  No Known Allergies   ROS  Constitutional: Negative for fever or weight change.  Respiratory: Negative for cough and shortness of breath.   Cardiovascular: Positive for chest pain and palpitations. Gastrointestinal: Negative for abdominal pain, no bowel changes.  Musculoskeletal: Negative for gait problem or joint swelling.  Skin: Negative for rash.  Neurological: Negative for headache, confusion. Positive for lightheadedness. No other specific complaints in a complete review of systems (except as listed in HPI above).  Chest pain- This is a new problem. This problem does not occur daily. This problem occurs a few times a month for the past few months. She has noticed chest pain and her heart skipping beats, which wakes her from her sleep. Once she is awake, the symptoms subside, only lasting for seconds total.  She denies personal cardiac history. Her father has had several heart attacks.   Objective  Vitals:   05/20/17 1103  BP: 140/84  Pulse: 79   Resp: 16  Temp: 98.6 F (37 C)  TempSrc: Oral  SpO2: 97%  Weight: 230 lb (104.3 kg)  Height: 5\' 2"  (1.575 m)    Body mass index is 42.07 kg/m.  Physical Exam Vital signs reviewed Constitutional: Patient appears well-developed and well-nourished. Obese. No distress.  HENT: Head: Normocephalic and atraumatic. Ears: B TMs ok, no erythema or effusion; Nose: Nose normal. Mouth/Throat: Oropharynx is clear and moist. No oropharyngeal exudate.  Eyes: Conjunctivae and EOM are normal. Pupils are equal, round, and reactive to light. No scleral icterus.  Neck: Normal range of motion. Neck supple.. No thyromegaly present.  Cardiovascular: Normal rate, regular rhythm and normal heart sounds.  No murmur heard. No BLE edema. Pulmonary/Chest: Effort normal and breath sounds normal. No respiratory distress. Abdominal: Soft. Bowel  sounds are normal, no distension. There is no tenderness. no masses Musculoskeletal: Normal range of motion, no joint effusions. No gross deformities Neurological: She is alert and oriented to person, place, and time. No cranial nerve deficit. Coordination, balance, strength, speech and gait are normal.  Skin: Skin is warm and dry. No rash noted. No erythema.  Psychiatric: Patient has a normal mood and affect. behavior is normal. Judgment and thought content normal.    Assessment & Plan RTC in 1 month for follow up of chest pain, lightheadedness, urinary leakage  Chest pain, unspecified type - EKG 12-Lead: I personally reviewed the patients EKG which read sinus rhythm, no acute abnormalities noted. Current ASCVD score 3.5-no indication for ASA, statin today. Maintained on ARB for BP control. We discussed return precautions including calling 911 for chest pain or sudden shortness of breath- See AVS for education provided to patient Will obtain labwork and determine need for additional testing, referral. RTC in about 1 month for follow up - CBC with Differential/Platelet;  Future - TSH; Future - Comprehensive metabolic panel; Future  Lightheadedness VS and PE normal today Will obtain lab work. - TSH; Future - Hemoglobin A1c; Future - CBC with Differential/Platelet; Future - Comprehensive metabolic panel; Future   Urinary incontinence, unspecified type We discussed kegels to strengthen bladder msucles-See AVS for education provided to patient She will RTC in about 1 month for follow up

## 2017-05-20 NOTE — Assessment & Plan Note (Signed)
Stable, continue current medications - losartan (COZAAR) 100 MG tablet; Take 1 tablet (100 mg total) by mouth daily.  Dispense: 90 tablet; Refill: 3 - TSH; Future - Comprehensive metabolic panel; Future

## 2017-05-20 NOTE — Patient Instructions (Addendum)
Please head downstairs for lab work.  I have placed a referral to neurology for a sleep study. Our office will call you to schedule this appointment. You should hear from our office in 7-10 days.  Please return in about 1 month for follow up, I want to see how you are doing.  It was good to see you. Thanks for letting me take care of you today :)   Nonspecific Chest Pain Chest pain can be caused by many different conditions. There is a chance that your pain could be related to something serious, such as a heart attack or a blood clot in your lungs. Chest pain can also be caused by conditions that are not life-threatening. If you have chest pain, it is very important to follow up with your doctor. Follow these instructions at home: Medicines  If you were prescribed an antibiotic medicine, take it as told by your doctor. Do not stop taking the antibiotic even if you start to feel better.  Take over-the-counter and prescription medicines only as told by your doctor. Lifestyle  Do not use any products that contain nicotine or tobacco, such as cigarettes and e-cigarettes. If you need help quitting, ask your doctor.  Do not drink alcohol.  Make lifestyle changes as told by your doctor. These may include: ? Getting regular exercise. Ask your doctor for some activities that are safe for you. ? Eating a heart-healthy diet. A diet specialist (dietitian) can help you to learn healthy eating options. ? Staying at a healthy weight. ? Managing diabetes, if needed. ? Lowering your stress, as with deep breathing or spending time in nature. General instructions  Avoid any activities that make you feel chest pain.  If your chest pain is because of heartburn: ? Raise (elevate) the head of your bed about 6 inches (15 cm). You can do this by putting blocks under the bed legs at the head of the bed. ? Do not sleep with extra pillows under your head. That does not help heartburn.  Keep all follow-up  visits as told by your doctor. This is important. This includes any further testing if your chest pain does not go away. Contact a doctor if:  Your chest pain does not go away.  You have a rash with blisters on your chest.  You have a fever.  You have chills. Get help right away if:  Your chest pain is worse.  You have a cough that gets worse, or you cough up blood.  You have very bad (severe) pain in your belly (abdomen).  You are very weak.  You pass out (faint).  You have either of these for no clear reason: ? Sudden chest discomfort. ? Sudden discomfort in your arms, back, neck, or jaw.  You have shortness of breath at any time.  You suddenly start to sweat, or your skin gets clammy.  You feel sick to your stomach (nauseous).  You throw up (vomit).  You suddenly feel light-headed or dizzy.  Your heart starts to beat fast, or it feels like it is skipping beats. These symptoms may be an emergency. Do not wait to see if the symptoms will go away. Get medical help right away. Call your local emergency services (911 in the [place] Do not drive yourself to the hospital. This information is not intended to replace advice given to you by your health care provider. Make sure you discuss any questions you have with your health care provider. Document Released: 09/01/2007 Document   Revised: 12/08/2015 Document Reviewed: 12/08/2015 Elsevier Interactive Patient Education  2017 Amanda [name], Amanda [name] Preventive care refers to lifestyle choices and visits with your health care provider that can promote health and wellness. What does preventive care include?  A yearly physical exam. This is also called an annual well check.  Dental exams once or twice a year.  Routine eye exams. Ask your health care provider how often you should have your eyes checked.  Personal lifestyle choices, including: ? Daily care of your teeth and gums. ? Regular  physical activity. ? Eating a healthy diet. ? Avoiding tobacco and drug use. ? Limiting alcohol use. ? Practicing safe sex. ? Taking low-dose aspirin daily starting at age 54. ? Taking vitamin and mineral supplements as recommended by your health care provider. What happens during an annual well check? The services and screenings done by your health care provider during your annual well check will depend on your age, overall health, lifestyle risk factors, and family history of disease. Counseling Your health care provider may ask you questions about your:  Alcohol use.  Tobacco use.  Drug use.  Emotional well-being.  Home and relationship well-being.  Sexual activity.  Eating habits.  Work and work Statistician.  Method of birth control.  Menstrual cycle.  Pregnancy history.  Screening You may have the following tests or measurements:  Height, weight, and BMI.  Blood pressure.  Lipid and cholesterol levels. These may be checked every 5 [name], or more frequently if you are over 36 [name] old.  Skin check.  Lung cancer screening. You may have this screening every year starting at age 49 if you have a 30-pack-year history of smoking and currently smoke or have quit within the past 15 [name].  Fecal occult blood test (FOBT) of the stool. You may have this test every year starting at age 39.  Flexible sigmoidoscopy or colonoscopy. You may have a sigmoidoscopy every 5 [name] or a colonoscopy every 10 [name] starting at age 43.  Hepatitis C blood test.  Hepatitis B blood test.  Sexually transmitted disease (STD) testing.  Diabetes screening. This is done by checking your blood sugar (glucose) after you have not eaten for a while (fasting). You may have this done every 1-3 [name].  Mammogram. This may be done every 1-2 [name]. Talk to your health care provider about when you should start having regular mammograms. This may depend on whether you have a family history of  breast cancer.  BRCA-related cancer screening. This may be done if you have a family history of breast, ovarian, tubal, or peritoneal cancers.  Pelvic exam and Pap test. This may be done every 3 [name] starting at age 68. Starting at age 9, this may be done every 5 [name] if you have a Pap test in combination with an HPV test.  Bone density scan. This is done to screen for osteoporosis. You may have this scan if you are at high risk for osteoporosis.  Discuss your test results, treatment options, and if necessary, the need for more tests with your health care provider. Vaccines Your health care provider may recommend certain vaccines, such as:  Influenza vaccine. This is recommended every year.  Tetanus, diphtheria, and acellular pertussis (Tdap, Td) vaccine. You may need a Td booster every 10 [name].  Varicella vaccine. You may need this if you have not been vaccinated.  Zoster vaccine. You may need this after age 87.  Measles, mumps, and  rubella (MMR) vaccine. You may need at least one dose of MMR if you were born in 1957 or later. You may also need a second dose.  Pneumococcal 13-valent conjugate (PCV13) vaccine. You may need this if you have certain conditions and were not previously vaccinated.  Pneumococcal polysaccharide (PPSV23) vaccine. You may need one or two doses if you smoke cigarettes or if you have certain conditions.  Meningococcal vaccine. You may need this if you have certain conditions.  Hepatitis A vaccine. You may need this if you have certain conditions or if you travel or work in places where you may be exposed to hepatitis A.  Hepatitis B vaccine. You may need this if you have certain conditions or if you travel or work in places where you may be exposed to hepatitis B.  Haemophilus influenzae type b (Hib) vaccine. You may need this if you have certain conditions.  Talk to your health care provider about which screenings and vaccines you need and how often you  need them. This information is not intended to replace advice given to you by your health care provider. Make sure you discuss any questions you have with your health care provider. Document Released: 04/11/2015 Document Revised: 12/03/2015 Document Reviewed: 01/14/2015 Elsevier Interactive Patient Education  2018 Amanda [name].   Kegel Exercises Kegel exercises help strengthen the muscles that support the rectum, vagina, small intestine, bladder, and uterus. Doing Kegel exercises can help:  Improve bladder and bowel control.  Improve sexual response.  Reduce problems and discomfort during pregnancy.  Kegel exercises involve squeezing your pelvic floor muscles, which are the same muscles you squeeze when you try to stop the flow of urine. The exercises can be done while sitting, standing, or lying down, but it is best to vary your position. Phase 1 exercises 1. Squeeze your pelvic floor muscles tight. You should feel a tight lift in your rectal area. If you are a Amanda [name], you should also feel a tightness in your vaginal area. Keep your stomach, buttocks, and legs relaxed. 2. Hold the muscles tight for up to 10 seconds. 3. Relax your muscles. Repeat this exercise 50 times a day or as many times as told by your health care provider. Continue to do this exercise for at least 4-6 weeks or for as long as told by your health care provider. This information is not intended to replace advice given to you by your health care provider. Make sure you discuss any questions you have with your health care provider. Document Released: 03/01/2012 Document Revised: 11/08/2015 Document Reviewed: 02/02/2015 Elsevier Interactive Patient Education  [employer].

## 2017-05-21 LAB — HIV ANTIBODY (ROUTINE TESTING W REFLEX): HIV: NONREACTIVE

## 2017-05-25 ENCOUNTER — Encounter: Payer: Self-pay | Admitting: Family

## 2017-05-25 ENCOUNTER — Ambulatory Visit: Payer: BLUE CROSS/BLUE SHIELD | Admitting: Family

## 2017-05-25 ENCOUNTER — Ambulatory Visit (INDEPENDENT_AMBULATORY_CARE_PROVIDER_SITE_OTHER)
Admission: RE | Admit: 2017-05-25 | Discharge: 2017-05-25 | Disposition: A | Discharge status: Home / Self Care | Payer: BLUE CROSS/BLUE SHIELD | Source: Ambulatory Visit | Attending: Family | Admitting: Family

## 2017-05-25 VITALS — BP 134/84 | HR 62 | Temp 98.4°F | Ht 62.0 in | Wt 235.0 lb

## 2017-05-25 DIAGNOSIS — R221 Localized swelling, mass and lump, neck: Secondary | ICD-10-CM | POA: Diagnosis not present

## 2017-05-25 DIAGNOSIS — R590 Localized enlarged lymph nodes: Secondary | ICD-10-CM

## 2017-05-25 NOTE — Progress Notes (Signed)
Amanda [name] is a 52 y.o. female with the following history as recorded in Epic[facility]:  Patient Active Problem List   Diagnosis Date Noted  . Encounter for general adult medical examination with abnormal findings 05/20/2017  . Non-seasonal allergic rhinitis 04/14/2017  . Moderate obstructive sleep apnea 05/20/2016  . Recurrent sinus infections 06/12/2014  . GERD (gastroesophageal reflux disease) 03/08/2012  . Benign essential hypertension 03/08/2012    Current Outpatient Medications  Medication Sig Dispense Refill  . cetirizine (ZYRTEC) 10 MG tablet TAKE 1 TABLET BY MOUTH ONCE DAILY 90 tablet 1  . losartan (COZAAR) 100 MG tablet Take 1 tablet (100 mg total) by mouth daily. 90 tablet 3  . omeprazole (PRILOSEC) 20 MG capsule TAKE 1 CAPSULE BY MOUTH ONCE DAILY 90 capsule 0   No current facility-administered medications for this visit.     Allergies: Patient has no known allergies.  Past Medical History:  Diagnosis Date  . Allergy   . Diverticulitis   . GERD (gastroesophageal reflux disease) 1993  . Hypertension 1993  . Sleep apnea   . Vitamin D deficiency 2011    Past Surgical History:  Procedure Laterality Date  . ABDOMINAL HYSTERECTOMY    . CHOLECYSTECTOMY    . CHOLECYSTECTOMY, LAPAROSCOPIC    . TUBAL LIGATION      Family History  Problem Relation Age of Onset  . Hypertension Mother   . Lupus Mother   . Hypertension Father   . Diabetes Father   . Heart disease Father   . Prostate cancer Father   . Asthma Brother   . Asthma Sister   . Cancer Sister        breast cancer   . Breast cancer Sister 20  . Diabetes Sister   . Diabetes Brother   . Hypertension Sister   . Hypertension Brother     Social History   Tobacco Use  . Smoking status: Never Smoker  . Smokeless tobacco: Never Used  Substance Use Topics  . Alcohol use: No    Subjective:  Woke up Saturday am and felt "lump" on the left side of her neck; notes that on Saturday, she just didn't feel  well- had chills and rested in her recliner; by Sunday, felt a little better and pain in the neck better but swelling persisting; no chest pain, no shortness of breath, no difficulty breathing;  No known insect bites; no injury or trauma to the neck; no headache; no difficulty turning neck; no light sensitivity; Had normal labs and EKG done at her CPE on Friday; no new foods or medications; did get a Tdap on her left arm on Friday;   Objective:  Vitals:   05/25/17 0820  BP: 134/84  Pulse: 62  Temp: 98.4 F (36.9 C)  TempSrc: Oral  SpO2: 99%  Weight: 235 lb (106.6 kg)  Height: 5\' 2"  (1.575 m)    General: Well developed, well nourished, in no acute distress  Skin : Warm and dry.  Head: Normocephalic and atraumatic  Eyes: Sclera and conjunctiva clear; pupils round and reactive to light; extraocular movements intact  Ears: External normal; canals clear; tympanic membranes normal  Oropharynx: Pink, supple. No suspicious lesions  Neck: Supple without thyromegaly, area of concern noted above mid- left clavicle- area of swelling with no specific mass noted; no redness, no tenderness Lungs: Respirations unlabored; clear to auscultation bilaterally without wheeze, rales, rhonchi  CVS exam: normal rate and regular rhythm.  Neurologic: Alert and oriented; speech intact; face symmetrical;  moves all extremities well; CNII-XII intact without focal deficit  Assessment:  1. Lymphadenopathy of left cervical region     Plan:  ? If symptoms are related to having gotten Tdap injection the day before symptoms started; no redness of pain noted; will update CXR today and will schedule for neck ultrasound; do not feel labs need to be updated today as labs were done on 2/22 and all normal; follow up to be determined.   No Follow-up on file.  Orders Placed This Encounter  Procedures  . DG Chest 2 View    Standing Status:   Future    Number of Occurrences:   1    Standing Expiration Date:   07/24/2018     Order Specific Question:   Reason for Exam (SYMPTOM  OR DIAGNOSIS REQUIRED)    Answer:   neck pain/ ? swollen lymph node    Order Specific Question:   Is patient pregnant?    Answer:   No    Order Specific Question:   Preferred imaging location?    Answer:   [address]    Order Specific Question:   Radiology Contrast Protocol - do NOT remove file path    Answer:   [email or web address]  . US Soft Tissue Head/Neck    Standing Status:   Future    Standing Expiration Date:   07/24/2018    Order Specific Question:   Reason for Exam (SYMPTOM  OR DIAGNOSIS REQUIRED)    Answer:   swollen area left clavicle/ cervical region; ? lymphadenopathy    Order Specific Question:   Preferred imaging location?    Answer:   [facility]    Requested Prescriptions    No prescriptions requested or ordered in this encounter

## 2017-05-27 ENCOUNTER — Ambulatory Visit
Admission: RE | Admit: 2017-05-27 | Discharge: 2017-05-27 | Disposition: A | Discharge status: Home / Self Care | Payer: BLUE CROSS/BLUE SHIELD | Source: Ambulatory Visit | Attending: Family | Admitting: Family

## 2017-05-27 DIAGNOSIS — R590 Localized enlarged lymph nodes: Secondary | ICD-10-CM

## 2017-05-27 DIAGNOSIS — R221 Localized swelling, mass and lump, neck: Secondary | ICD-10-CM | POA: Diagnosis not present

## 2017-06-24 ENCOUNTER — Encounter: Payer: Self-pay | Admitting: Nurse Practitioner

## 2017-06-24 ENCOUNTER — Ambulatory Visit: Payer: BLUE CROSS/BLUE SHIELD | Admitting: Nurse Practitioner

## 2017-06-24 VITALS — BP 140/82 | HR 69 | Temp 98.7°F | Resp 16 | Ht 62.0 in | Wt 235.0 lb

## 2017-06-24 DIAGNOSIS — N393 Stress incontinence (female) (male): Secondary | ICD-10-CM | POA: Diagnosis not present

## 2017-06-24 DIAGNOSIS — R42 Dizziness and giddiness: Secondary | ICD-10-CM

## 2017-06-24 DIAGNOSIS — R079 Chest pain, unspecified: Secondary | ICD-10-CM

## 2017-06-24 NOTE — Progress Notes (Signed)
Name: Amanda [name]   MRN: [id number]    DOB: Apr 23, 1965   Date:06/26/2017       Progress Note  Subjective  Chief Complaint  Chief Complaint  Patient presents with  . Follow-up    HPI Amanda [name] Is coming in follow up of several complaints that were first evaluated at her CPE last month.  Referred to sleep study but she forgot to tell me that she has CPap - study in may of last year so canceeled?  Chest pain- NOTE FROM WUJ:WJXB is a new problem. This problem does not occur daily. This problem occurs a few times a month for the past few months. She has noticed chest pain and her heart skipping beats, which wakes her from her sleep. Once she is awake, the symptoms subside, only lasting for seconds total.  She denies personal cardiac history. Her father has had several heart attacks. EKG, CBC, TSH, CMET were normal and referral to neurology for sleep study was placed due to her symptoms waking her from sleep. TODAY: she says her symptoms have resolved, she has not noticed any chest pain or skipping heart beats since her last visit She denies fevers,cough, shortness of breath. She did not follow up for sleep study, tells me today that she just had a sleep study last year.  Lightheadedness- She has noticed some continued lightheadeness in the am before she eats breakfast- she gets up and goes to work and begins her shift before she eats anything She denies syncope, weakness, confusion, speech difficulty, paresthesia, nausea or vomiting.  Urinary incontinence- She c/o occasionally urinary leakage after coughing or sneezing This occurs several days a week She denies abdominal pain, flank pain, hematuria, dysuria. She did not try the kegel exercises we discussed at her last OV  Patient Active Problem List   Diagnosis Date Noted  . Encounter for general adult medical examination with abnormal findings 05/20/2017  . Non-seasonal allergic rhinitis 04/14/2017  . Moderate  obstructive sleep apnea 05/20/2016  . Recurrent sinus infections 06/12/2014  . GERD (gastroesophageal reflux disease) 03/08/2012  . Benign essential hypertension 03/08/2012    Past Surgical History:  Procedure Laterality Date  . ABDOMINAL HYSTERECTOMY    . CHOLECYSTECTOMY    . CHOLECYSTECTOMY, LAPAROSCOPIC    . TUBAL LIGATION      Family History  Problem Relation Age of Onset  . Hypertension Mother   . Lupus Mother   . Hypertension Father   . Diabetes Father   . Heart disease Father   . Prostate cancer Father   . Asthma Brother   . Asthma Sister   . Cancer Sister        breast cancer   . Breast cancer Sister 50  . Diabetes Sister   . Diabetes Brother   . Hypertension Sister   . Hypertension Brother     Social History   Socioeconomic History  . Marital status: Widowed    Spouse name: Not on file  . Number of children: 2  . Years of education: GED  . Highest education level: Not on file  Occupational History  . Occupation: Location manager  Social Needs  . Financial resource strain: Not on file  . Food insecurity:    Worry: Not on file    Inability: Not on file  . Transportation needs:    Medical: Not on file    Non-medical: Not on file  Tobacco Use  . Smoking status: Never Smoker  . Smokeless tobacco: Never Used  Substance and Sexual Activity  . Alcohol use: No  . Drug use: No  . Sexual activity: Not on file  Lifestyle  . Physical activity:    Days per week: Not on file    Minutes per session: Not on file  . Stress: Not on file  Relationships  . Social connections:    Talks on phone: Not on file    Gets together: Not on file    Attends religious service: Not on file    Active member of club or organization: Not on file    Attends meetings of clubs or organizations: Not on file    Relationship status: Not on file  . Intimate partner violence:    Fear of current or ex partner: Not on file    Emotionally abused: Not on file    Physically abused:  Not on file    Forced sexual activity: Not on file  Other Topics Concern  . Not on file  Social History Narrative   [place]ives in [place] with her sister. She works at [employer].    Drinks about 2 caffeine drinks a week      Current Outpatient Medications:  .  cetirizine (ZYRTEC) 10 MG tablet, TAKE 1 TABLET BY MOUTH ONCE DAILY, Disp: 90 tablet, Rfl: 1 .  losartan (COZAAR) 100 MG tablet, Take 1 tablet (100 mg total) by mouth daily., Disp: 90 tablet, Rfl: 3 .  omeprazole (PRILOSEC) 20 MG capsule, TAKE 1 CAPSULE BY MOUTH ONCE DAILY, Disp: 90 capsule, Rfl: 0  No Known Allergies   ROS See HPI  Objective  Vitals:   06/24/17 1536  BP: 140/82  Pulse: 69  Resp: 16  Temp: 98.7 F (37.1 C)  TempSrc: Oral  SpO2: 97%  Weight: 235 lb (106.6 kg)  Height: 5\' 2"  (1.575 m)    Body mass index is 42.98 kg/m.  Physical Exam Vital signs reviewed Constitutional: Patient appears well-developed and well-nourished. Obese. No distress.  HENT: Head: Normocephalic and atraumatic. Nose: Nose normal. Mouth/Throat: Oropharynx is clear and moist.  Eyes: Conjunctivae and EOM are normal. Pupils are equal, round, and reactive to light. No scleral icterus.  Neck: Normal range of motion. Neck supple. No carotid bruit present. Cardiovascular: Normal rate, regular rhythm and normal heart sounds.  No murmur heard. No BLE edema. Pulmonary/Chest: Effort normal and breath sounds normal. No respiratory distress. Neurological: She is alert and oriented to person, place, and time. No cranial nerve deficit. Coordination, balance, strength, speech and gait are normal.  Skin: Skin is warm and dry. No rash noted. No erythema.  Psychiatric: Patient has a normal mood and affect. behavior is normal. Judgment and thought content normal.    Assessment & Plan RTC in 6 months for F/U: HLD- recheck Lipids; Pre-diabetes; recheck A1c  -Reviewed Health Maintenance: up to date  Chest pain, unspecified  type On chart review she did see Amanda [name] for OV for sleep apnea on 02/10/17 with instructions for 1 year F/U Due to her age, personal hx pre-diabetes and high cholesterol, and family history of MI, I recommended referral to cardiology for further evaluation and management of cardiac symptoms and she is agreeable - Ambulatory referral to Cardiology  Lightheadedness A1c from CPE on 2/22 showed pre-diabetes-We discussed that her lighheadedness could be related to her prediabetes and not eating breakfast We discussed the importance of three balanced meals a day to avoid hypoglycemia and improve weight control, also discussed how to recognize and treat hypoglycemia-See AVS for  additional information provided to patient She declines nutrition referral- actually says that she does not plan to eat healthy or follow a diabetic diet F/U A1c in 6 months Also referring to cardiology today for further evaluation of cardiac complaints - Ambulatory referral to Cardiology

## 2017-06-24 NOTE — Patient Instructions (Signed)
I have placed a referral to cardiololgy. Our office will call you to schedule this appointment. You should hear from our office in 7-10 days.  Please eat 3 meals a day to avoid low blood sugars- please follow up If you continue to feel this lightheadedness despite eating three meals a day.  Please return in about 6 months so we can see how you are doing and recheck your cholesterol and A1c.   Hypoglycemia Hypoglycemia is when the sugar (glucose) level in the blood is too low. Symptoms of low blood sugar may include:  Feeling: ? Hungry. ? Worried or nervous (anxious). ? Sweaty and clammy. ? Confused. ? Dizzy. ? Sleepy. ? Sick to your stomach (nauseous).  Having: ? A fast heartbeat. ? A headache. ? A change in your vision. ? Jerky movements that you cannot control (seizure). ? Nightmares. ? Tingling or no feeling (numbness) around the mouth, lips, or tongue.  Having trouble with: ? Talking. ? Paying attention (concentrating). ? Moving (coordination). ? Sleeping.  Shaking.  Passing out (fainting).  Getting upset easily (irritability).  Low blood sugar can happen to people who have diabetes and people who do not have diabetes. Low blood sugar can happen quickly, and it can be an emergency. Treating Low Blood Sugar Low blood sugar is often treated by eating or drinking something sugary right away. If you can think clearly and swallow safely, follow the 15:15 rule:  Take 15 grams of a fast-acting carb (carbohydrate). Some fast-acting carbs are: ? 1 tube of glucose gel. ? 3 sugar tablets (glucose pills). ? 6-8 pieces of hard candy. ? 4 oz (120 mL) of fruit juice. ? 4 oz (120 mL) of regular (not diet) soda.  Check your blood sugar 15 minutes after you take the carb.  If your blood sugar is still at or below 70 mg/dL (3.9 mmol/L), take 15 grams of a carb again.  If your blood sugar does not go above 70 mg/dL (3.9 mmol/L) after 3 tries, get help right away.  After your  blood sugar goes back to normal, eat a meal or a snack within 1 hour.  Treating Very Low Blood Sugar If your blood sugar is at or below 54 mg/dL (3 mmol/L), you have very low blood sugar (severe hypoglycemia). This is an emergency. Do not wait to see if the symptoms will go away. Get medical help right away. Call your local emergency services (911 in the [place] Do not drive yourself to the hospital. If you have very low blood sugar and you cannot eat or drink, you may need a glucagon shot (injection). A family member or friend should learn how to check your blood sugar and how to give you a glucagon shot. Ask your doctor if you need to have a glucagon shot kit at home. Follow these instructions at home: General instructions  Avoid any diets that cause you to not eat enough food. Talk with your doctor before you start any new diet.  Take over-the-counter and prescription medicines only as told by your doctor.  Limit alcohol to no more than 1 drink per day for nonpregnant women and 2 drinks per day for men. One drink equals 12 oz of beer, 5 oz of wine, or 1 oz of hard liquor.  Keep all follow-up visits as told by your doctor. This is important. If You Have Diabetes:   Make sure you know the symptoms of low blood sugar.  Always keep a source of sugar with you,  such as: ? Sugar. ? Sugar tablets. ? Glucose gel. ? Fruit juice. ? Regular soda (not diet soda). ? Milk. ? Hard candy. ? Honey.  Take your medicines as told.  Follow your exercise and meal plan. ? Eat on time. Do not skip meals. ? Follow your sick day plan when you cannot eat or drink normally. Make this plan ahead of time with your doctor.  Check your blood sugar as often as told by your doctor. Always check before and after exercise.  Share your diabetes care plan with: ? Your work or school. ? People you live with.  Check your pee (urine) for ketones: ? When you are sick. ? As told by your doctor.  Carry a card or  wear jewelry that says you have diabetes. If You Have Low Blood Sugar From Other Causes:   Check your blood sugar as often as told by your doctor.  Follow instructions from your doctor about what you cannot eat or drink. Contact a doctor if:  You have trouble keeping your blood sugar in your target range.  You have low blood sugar often. Get help right away if:  You still have symptoms after you eat or drink something sugary.  Your blood sugar is at or below 54 mg/dL (3 mmol/L).  You have jerky movements that you cannot control.  You pass out. These symptoms may be an emergency. Do not wait to see if the symptoms will go away. Get medical help right away. Call your local emergency services (911 in the [place] Do not drive yourself to the hospital. This information is not intended to replace advice given to you by your health care provider. Make sure you discuss any questions you have with your health care provider. Document Released: 06/09/2009 Document Revised: 08/21/2015 Document Reviewed: 04/18/2015 Elsevier Interactive Patient Education  [employer].

## 2017-06-26 ENCOUNTER — Encounter: Payer: Self-pay | Admitting: Nurse Practitioner

## 2017-06-26 DIAGNOSIS — N393 Stress incontinence (female) (male): Secondary | ICD-10-CM | POA: Insufficient documentation

## 2017-06-26 NOTE — Assessment & Plan Note (Signed)
We dicussed healthy diet, regular exercise, weight loss in the management of stress incontinence She does not wish to pursue any further treatment for her incontinence at this time Encouraged to continue Kegels F/U for new or worsening symptoms

## 2017-08-17 ENCOUNTER — Ambulatory Visit: Payer: BLUE CROSS/BLUE SHIELD | Admitting: Cardiovascular Disease

## 2017-08-17 ENCOUNTER — Encounter: Payer: Self-pay | Admitting: Cardiovascular Disease

## 2017-08-17 VITALS — BP 146/86 | HR 66 | Ht 62.0 in | Wt 233.4 lb

## 2017-08-17 DIAGNOSIS — R9431 Abnormal electrocardiogram [ECG] [EKG]: Secondary | ICD-10-CM | POA: Diagnosis not present

## 2017-08-17 DIAGNOSIS — R079 Chest pain, unspecified: Secondary | ICD-10-CM | POA: Diagnosis not present

## 2017-08-17 MED ORDER — METOPROLOL TARTRATE 50 MG PO TABS: 50.0000 mg | ORAL_TABLET | Freq: Once | ORAL | 0 refills | Status: DC

## 2017-08-17 NOTE — Progress Notes (Signed)
Cardiology Office Note:    Date:  08/17/2017   ID:  Amanda [name], DOB 1965-06-04, MRN [id number]  PCP:  Evaristo Bury, NP  Cardiologist:   New to [facility]   Referring MD: Evaristo Bury, NP   Chief Complaint  Patient presents with  . Chest Pain       Amanda [name] is a 52 y.o. female with a hx of hypertension.  We are asked to see her today for further evaluation of chest discomfort by Dr.  Aura Camps   Has had palpitations for the past 5 years.  Associated with near syncope Has occasional CP.    Wakes up with chest pain .   Early morning hours or middle of the night .  Not exertional . Does not get any regular exercise.   Just bought a treadmill .   Has not started using it yet   Is eating more salt than usual.   earting more fast foods.   Having to take care of her mom.  Her father is now in a memory care unit.  Looking for assisted living care.     Past Medical History:  Diagnosis Date  . Allergy   . Diverticulitis   . GERD (gastroesophageal reflux disease) 1993  . Hypertension 1993  . Sleep apnea   . Vitamin D deficiency 2011    Past Surgical History:  Procedure Laterality Date  . ABDOMINAL HYSTERECTOMY    . CHOLECYSTECTOMY    . CHOLECYSTECTOMY, LAPAROSCOPIC    . TUBAL LIGATION      Current Medications: Current Meds  Medication Sig  . cetirizine (ZYRTEC) 10 MG tablet TAKE 1 TABLET BY MOUTH ONCE DAILY  . losartan (COZAAR) 100 MG tablet Take 1 tablet (100 mg total) by mouth daily.  Marland Kitchen omeprazole (PRILOSEC) 20 MG capsule TAKE 1 CAPSULE BY MOUTH ONCE DAILY     Allergies:   Patient has no known allergies.   Social History   Socioeconomic History  . Marital status: Widowed    Spouse name: Not on file  . Number of children: 2  . Years of education: GED  . Highest education level: Not on file  Occupational History  . Occupation: Location manager  Social Needs  . Financial resource strain: Not on file  . Food insecurity:    Worry:  Not on file    Inability: Not on file  . Transportation needs:    Medical: Not on file    Non-medical: Not on file  Tobacco Use  . Smoking status: Never Smoker  . Smokeless tobacco: Never Used  Substance and Sexual Activity  . Alcohol use: No  . Drug use: No  . Sexual activity: Not on file  Lifestyle  . Physical activity:    Days per week: Not on file    Minutes per session: Not on file  . Stress: Not on file  Relationships  . Social connections:    Talks on phone: Not on file    Gets together: Not on file    Attends religious service: Not on file    Active member of club or organization: Not on file    Attends meetings of clubs or organizations: Not on file    Relationship status: Not on file  Other Topics Concern  . Not on file  Social History Narrative   Lives in [place] with her sister. She works at [employer] living.    Drinks about 2 caffeine drinks a week  Family History: The patient's family history includes Asthma in her brother and sister; Breast cancer (age of onset: 22) in her sister; Cancer in her sister; Diabetes in her brother, father, and sister; Heart disease in her father; Hypertension in her brother, father, mother, and sister; Lupus in her mother; Prostate cancer in her father.  ROS:   Please see the history of present illness.     All other systems reviewed and are negative.  EKGs/Labs/Other Studies Reviewed:    The following studies were reviewed today:   EKG: Normal sinus rhythm at 66.  Nonspecific ST and T wave changes.  Recent Labs: 05/20/2017: ALT 25; BUN 11; Creatinine, Ser 0.88; Hemoglobin 13.2; Platelets 277.0; Potassium 3.5; Sodium 141; TSH 1.63  Recent Lipid Panel    Component Value Date/Time   CHOL 218 (H) 05/20/2017 1209   TRIG 83.0 05/20/2017 1209   HDL 47.70 05/20/2017 1209   CHOLHDL 5 05/20/2017 1209   VLDL 16.6 05/20/2017 1209   LDLCALC 154 (H) 05/20/2017 1209    Physical Exam:    VS:  BP (!) 146/86    Pulse 66   Ht  (1.575 m)   Wt 233 lb 6.4 oz (105.9 kg)   SpO2 97%   BMI 42.69 kg/m     Wt Readings from Last 3 Encounters:  08/17/17 233 lb 6.4 oz (105.9 kg)  06/24/17 235 lb (106.6 kg)  05/25/17 235 lb (106.6 kg)     GEN:  Well nourished, well developed in no acute distress HEENT: Normal NECK: No JVD; No carotid bruits LYMPHATICS: No lymphadenopathy CARDIAC: RRR, no murmurs, rubs, gallops RESPIRATORY:  Clear to auscultation without rales, wheezing or rhonchi  ABDOMEN: Soft, non-tender, non-distended MUSCULOSKELETAL:  No edema; No deformity  SKIN: Warm and dry NEUROLOGIC:  Alert and oriented x 3 PSYCHIATRIC:  Normal affect   ASSESSMENT:    1. Abnormal ECG   2. Chest pain, unspecified type    PLAN:    In order of problems listed above:  1. Chest pain: Her chest pains are somewhat atypical but she does have a history of hypertension and prediabetes.  In addition, she has T wave inversions in the anterior lateral leads.  This is worrisome for the presence of coronary artery disease.  I would like to order a coronary CT angiogram for further evaluation.  2.  Hypertension: Patient has been taking care of her mother and her father.  She is been eating lots of fast foods.  I have encouraged her to work on a better diet including low-salt and low-fat diet.  I encouraged her to get some exercise.  See her in 3 months.  Medication Adjustments/Labs and Tests Ordered: Current medicines are reviewed at length with the patient today.  Concerns regarding medicines are outlined above.  Orders Placed This Encounter  Procedures  . CT CORONARY MORPH W/CTA COR W/SCORE W/CA W/CM &/OR WO/CM  . CT CORONARY FRACTIONAL FLOW RESERVE DATA PREP  . CT CORONARY FRACTIONAL FLOW RESERVE FLUID ANALYSIS  . EKG 12-Lead   Meds ordered this encounter  Medications  . metoprolol tartrate (LOPRESSOR) 50 MG tablet    Sig: Take 1 tablet (50 mg total) by mouth once for 1 dose. Take 1 hour prior to your  coronary CT    Dispense:  1 tablet    Refill:  0     Patient Instructions  Medication Instructions:  Your physician recommends that you continue on your current medications as directed. Please refer to the Current  Medication list given to you today.   Labwork: None Ordered   Testing/Procedures: Your physician has requested that you have cardiac CT. Cardiac computed tomography (CT) is a painless test that uses an x-ray machine to take clear, detailed pictures of your heart. For further information please visit [email or web address] Please follow instruction sheet as given.   Follow-Up: Your physician recommends that you schedule a follow-up appointment in: 3-4 months with Dr. Elease Hashimoto  FOR CARDIAC CT: Please arrive at the [facility] main entrance of [facility] at xx:xx AM (30-45 minutes prior to test start time)  [facility] [address] [place], [place] [address] [phone]  Proceed to the [facility] Radiology Department (First Floor).  Please follow these instructions carefully (unless otherwise directed):  Hold all erectile dysfunction medications at least 48 hours prior to test.  On the Night Before the Test: . Drink plenty of water. . Do not consume any caffeinated/decaffeinated beverages or chocolate 12 hours prior to your test. . Do not take any antihistamines 12 hours prior to your test.  On the Day of the Test: . Drink plenty of water. Do not drink any water within one hour of the test. . Do not eat any food 4 hours prior to the test. . You may take your regular medications prior to the test. . IF NOT ON A BETA BLOCKER - Take 50 mg of lopressor (metoprolol) one hour before the test. .   After the Test: . Drink plenty of water. . After receiving IV contrast, you may experience a mild flushed feeling. This is normal. . On occasion, you may experience a mild rash up to 24 hours after the test. This is not dangerous. If this occurs, you  can take Benadryl 25 mg and increase your fluid intake. . If you experience trouble breathing, this can be serious. If it is severe call 911 IMMEDIATELY. If it is mild, please call our office.    If you need a refill on your cardiac medications before your next appointment, please call your pharmacy.   Thank you for choosing [facility]! Eligha Bridegroom, RN [phone]       Signed, Kristeen Miss, MD  08/17/2017 5:41 PM    [facility]

## 2017-08-17 NOTE — Patient Instructions (Addendum)
Medication Instructions:  Your physician recommends that you continue on your current medications as directed. Please refer to the Current Medication list given to you today.   Labwork: None Ordered   Testing/Procedures: Your physician has requested that you have cardiac CT. Cardiac computed tomography (CT) is a painless test that uses an x-ray machine to take clear, detailed pictures of your heart. For further information please visit [email or web address] Please follow instruction sheet as given.   Follow-Up: Your physician recommends that you schedule a follow-up appointment in: 3-4 months with Dr. Elease Hashimoto  FOR CARDIAC CT: Please arrive at the [facility] main entrance of [facility] at xx:xx AM (30-45 minutes prior to test start time)  [facility] [address] [place], [place] [address] [phone]  Proceed to the [facility] Radiology Department (First Floor).  Please follow these instructions carefully (unless otherwise directed):  Hold all erectile dysfunction medications at least 48 hours prior to test.  On the Night Before the Test: . Drink plenty of water. . Do not consume any caffeinated/decaffeinated beverages or chocolate 12 hours prior to your test. . Do not take any antihistamines 12 hours prior to your test.  On the Day of the Test: . Drink plenty of water. Do not drink any water within one hour of the test. . Do not eat any food 4 hours prior to the test. . You may take your regular medications prior to the test. . IF NOT ON A BETA BLOCKER - Take 50 mg of lopressor (metoprolol) one hour before the test. .   After the Test: . Drink plenty of water. . After receiving IV contrast, you may experience a mild flushed feeling. This is normal. . On occasion, you may experience a mild rash up to 24 hours after the test. This is not dangerous. If this occurs, you can take Benadryl 25 mg and increase your fluid intake. . If you experience  trouble breathing, this can be serious. If it is severe call 911 IMMEDIATELY. If it is mild, please call our office.    If you need a refill on your cardiac medications before your next appointment, please call your pharmacy.   Thank you for choosing [facility]! Eligha Bridegroom, RN [phone]

## 2017-08-26 ENCOUNTER — Other Ambulatory Visit: Payer: Self-pay | Admitting: Family Medicine

## 2017-08-30 ENCOUNTER — Other Ambulatory Visit: Payer: Self-pay | Admitting: Family Medicine

## 2017-09-22 ENCOUNTER — Ambulatory Visit (HOSPITAL_COMMUNITY): Payer: BLUE CROSS/BLUE SHIELD

## 2017-10-05 ENCOUNTER — Ambulatory Visit (INDEPENDENT_AMBULATORY_CARE_PROVIDER_SITE_OTHER): Payer: BLUE CROSS/BLUE SHIELD

## 2017-10-05 ENCOUNTER — Encounter (HOSPITAL_COMMUNITY): Payer: Self-pay | Admitting: Emergency Medicine

## 2017-10-05 ENCOUNTER — Ambulatory Visit (HOSPITAL_COMMUNITY)
Admission: EM | Admit: 2017-10-05 | Discharge: 2017-10-05 | Disposition: A | Discharge status: Home / Self Care | Payer: BLUE CROSS/BLUE SHIELD | Attending: Family Medicine | Admitting: Family Medicine

## 2017-10-05 ENCOUNTER — Other Ambulatory Visit: Payer: Self-pay

## 2017-10-05 DIAGNOSIS — S90851A Superficial foreign body, right foot, initial encounter: Secondary | ICD-10-CM | POA: Diagnosis not present

## 2017-10-05 DIAGNOSIS — M79671 Pain in right foot: Secondary | ICD-10-CM

## 2017-10-05 MED ORDER — DICLOFENAC SODIUM 1 % TD GEL: 2.0000 g | Freq: Four times a day (QID) | TRANSDERMAL | 0 refills | Status: DC

## 2017-10-05 NOTE — Discharge Instructions (Signed)
Small stick removed from foot. Was not able to see other foreign bodies. You can apply votaren gel for pain relief and use corn/donut bandage to prevent putting pressure on the area to see if pain resolves. Follow up with podiatry for further evaluation if symptoms not improving.

## 2017-10-05 NOTE — ED Provider Notes (Signed)
[facility]    CSN: [id number] Arrival date & time: 10/05/17  1257     History   Chief Complaint Chief Complaint  Patient presents with  . Foot Pain    HPI Amanda [name] is a 52 y.o. female.   52 year old female with history of GERD, HTN, diverticulitis comes in for few week history of right foot pain with possible foreign body.  Does not recall stepping on anything.  However, has had pain for about 2 to 3 weeks, and a family member picked at the area and state of felt like something was there.  Area is painful, worse with bearing weight.  Patient states work requires long hours of standing and walking, and has increased the pain.  Denies spreading erythema, increased warmth.  Denies fever, chills, night sweats.     Past Medical History:  Diagnosis Date  . Allergy   . Diverticulitis   . GERD (gastroesophageal reflux disease) 1993  . Hypertension 1993  . Sleep apnea   . Vitamin D deficiency 2011    Patient Active Problem List   Diagnosis Date Noted  . Stress incontinence of urine 06/26/2017  . Encounter for general adult medical examination with abnormal findings 05/20/2017  . Non-seasonal allergic rhinitis 04/14/2017  . Moderate obstructive sleep apnea 05/20/2016  . Recurrent sinus infections 06/12/2014  . GERD (gastroesophageal reflux disease) 03/08/2012  . Benign essential hypertension 03/08/2012    Past Surgical History:  Procedure Laterality Date  . ABDOMINAL HYSTERECTOMY    . CHOLECYSTECTOMY    . CHOLECYSTECTOMY, LAPAROSCOPIC    . TUBAL LIGATION      OB History    Gravida  2   Para  2   Term      Preterm      AB      Living        SAB      TAB      Ectopic      Multiple      Live Births               Home Medications    Prior to Admission medications   Medication Sig Start Date End Date Taking? Authorizing Provider  cetirizine (ZYRTEC) 10 MG tablet TAKE 1 TABLET BY MOUTH ONCE DAILY 08/30/17   Evaristo Bury, NP  diclofenac sodium (VOLTAREN) 1 % GEL Apply 2 g topically 4 (four) times daily. 10/05/17   Cathie Hoops, Omolola Mittman V, PA-C  losartan (COZAAR) 100 MG tablet Take 1 tablet (100 mg total) by mouth daily. 05/20/17   Evaristo Bury, NP  metoprolol tartrate (LOPRESSOR) 50 MG tablet Take 1 tablet (50 mg total) by mouth once for 1 dose. Take 1 hour prior to your coronary CT 08/17/17 08/17/17  [facility], Deloris Ping, MD  omeprazole (PRILOSEC) 20 MG capsule TAKE 1 CAPSULE BY MOUTH ONCE DAILY 12/14/16   Beaulah Dinning, MD    Family History Family History  Problem Relation Age of Onset  . Hypertension Mother   . Lupus Mother   . Hypertension Father   . Diabetes Father   . Heart disease Father   . Prostate cancer Father   . Asthma Brother   . Asthma Sister   . Cancer Sister        breast cancer   . Breast cancer Sister 27  . Diabetes Sister   . Diabetes Brother   . Hypertension Sister   . Hypertension Brother     Social History Social  History   Tobacco Use  . Smoking status: Never Smoker  . Smokeless tobacco: Never Used  Substance Use Topics  . Alcohol use: No  . Drug use: No     Allergies   Patient has no known allergies.   Review of Systems Review of Systems  Reason unable to perform ROS: See HPI as above.     Physical Exam Triage Vital Signs ED Triage Vitals  Enc Vitals Group     BP 10/05/17 1313 137/84     Pulse Rate 10/05/17 1313 75     Resp 10/05/17 1313 18     Temp 10/05/17 1313 97.7 F (36.5 C)     Temp src --      SpO2 10/05/17 1313 97 %     Weight --      Height --      Head Circumference --      Peak Flow --      Pain Score 10/05/17 1311 2     Pain Loc --      Pain Edu? --      Excl. in GC? --    No data found.  Updated Vital Signs BP 137/84 (BP Location: Left Arm)   Pulse 75   Temp 97.7 F (36.5 C)   Resp 18   SpO2 97%   Physical Exam  Constitutional: She is oriented to person, place, and time. She appears well-developed and  well-nourished. No distress.  HENT:  Head: Normocephalic and atraumatic.  Eyes: Pupils are equal, round, and reactive to light. Conjunctivae are normal.  Musculoskeletal:  Black object seen to the plantar surface, 3-4cm inferior to the 1st MTP. Callus formed around area. No open wound. Full ROM of ankle and toes. Sensation intact.   Neurological: She is alert and oriented to person, place, and time.  Skin: She is not diaphoretic.    UC Treatments / Results  Labs (all labs ordered are listed, but only abnormal results are displayed) Labs Reviewed - No data to display  EKG None  Radiology Dg Foot Complete Right  Result Date: 10/05/2017 CLINICAL DATA:  Foreign body in the plantar aspect of the right foot. EXAM: RIGHT FOOT COMPLETE - 3+ VIEW COMPARISON:  None. FINDINGS: No acute fracture or dislocation. No radiopaque foreign body in the right foot. Plantar calcaneal spur. Enthesopathic changes of the Achilles tendon insertion. Mild osteoarthritis of first MTP joint. Mild osteoarthritis of the first MTP joint. IMPRESSION: No acute osseous injury of the right foot. Electronically Signed   By: Elige KoHetal  Patel   On: 10/05/2017 14:13   Procedures Foreign Body Removal Date/Time: 10/05/2017 2:44 PM Performed by: Belinda FisherYu, Ricca Melgarejo V, PA-C Authorized by: Mardella LaymanHagler, Brian, MD   Consent:    Consent obtained:  Verbal   Consent given by:  Patient   Risks discussed:  Bleeding, infection, pain, incomplete removal, worsening of condition and poor cosmetic result   Alternatives discussed:  Referral Location:    Location:  Foot   Foot location:  R sole   Depth:  Intradermal   Tendon involvement:  None Pre-procedure details:    Imaging:  X-ray   Neurovascular status: intact     Preparation: Patient was prepped and draped in usual sterile fashion   Anesthesia (see MAR for exact dosages):    Anesthesia method:  None Procedure type:    Procedure complexity:  Simple Procedure details:    Scalpel size:  15    Localization method:  Visualized   Dissection of underlying  tissues: scraped skin to expose tip of foreign body.     Bloodless field: yes     Removal mechanism:  Forceps   Foreign bodies recovered:  1   Description:  Thin black stick   Intact foreign body removal: yes   Post-procedure details:    Neurovascular status: intact     Confirmation:  No additional foreign bodies on visualization   Skin closure:  None   Dressing:  Antibiotic ointment and bulky dressing   Patient tolerance of procedure:  Tolerated well, no immediate complications   (including critical care time)  Medications Ordered in UC Medications - No data to display  Initial Impression / Assessment and Plan / UC Course  I have reviewed the triage vital signs and the nursing notes.  Pertinent labs & imaging results that were available during my care of the patient were reviewed by me and considered in my medical decision making (see chart for details).    No foreign body visualized on x-ray.  1 foreign body removed, appears to be thin stick that is black, about 0.3 cm long.  No other foreign body visualized.  Will have patient  apply corn/donut dressing to help relieve pressure to the area.  Follow-up with podiatry for further evaluation if symptoms not improving.  Patient expresses understanding and agrees to plan.  Final Clinical Impressions(s) / UC Diagnoses   Final diagnoses:  Foreign body in right foot, initial encounter    ED Prescriptions    Medication Sig Dispense Auth. Provider   diclofenac sodium (VOLTAREN) 1 % GEL Apply 2 g topically 4 (four) times daily. 1 Tube Threasa Alpha, [place] 10/05/17 1449

## 2017-10-05 NOTE — ED Triage Notes (Addendum)
Right foot pain, thinks something is in foot.  A week ago a family member was picking at this area and thought to have removed something.  But patient still feels something in foot.    Area on ball of foot, base of great toe, firm area.  Tender to touch

## 2017-10-06 ENCOUNTER — Ambulatory Visit (HOSPITAL_COMMUNITY): Payer: BLUE CROSS/BLUE SHIELD

## 2017-10-06 ENCOUNTER — Ambulatory Visit (HOSPITAL_COMMUNITY)
Admission: RE | Admit: 2017-10-06 | Discharge: 2017-10-06 | Disposition: A | Discharge status: Home / Self Care | Payer: BLUE CROSS/BLUE SHIELD | Source: Ambulatory Visit | Attending: Cardiovascular Disease | Admitting: Cardiovascular Disease

## 2017-10-06 ENCOUNTER — Encounter (HOSPITAL_COMMUNITY): Payer: Self-pay

## 2017-10-06 DIAGNOSIS — R079 Chest pain, unspecified: Secondary | ICD-10-CM | POA: Insufficient documentation

## 2017-10-06 DIAGNOSIS — R9431 Abnormal electrocardiogram [ECG] [EKG]: Secondary | ICD-10-CM | POA: Insufficient documentation

## 2017-10-06 DIAGNOSIS — I2510 Atherosclerotic heart disease of native coronary artery without angina pectoris: Secondary | ICD-10-CM | POA: Insufficient documentation

## 2017-10-06 MED ORDER — NITROGLYCERIN 0.4 MG SL SUBL
0.8000 mg | SUBLINGUAL_TABLET | Freq: Once | SUBLINGUAL | Status: AC
  Administered 2017-10-06: 0.8 mg via SUBLINGUAL
  Filled 2017-10-06: qty 25

## 2017-10-06 MED ORDER — NITROGLYCERIN 0.4 MG SL SUBL
SUBLINGUAL_TABLET | SUBLINGUAL | Status: AC
  Administered 2017-10-06: 0.8 mg via SUBLINGUAL
  Filled 2017-10-06: qty 2

## 2017-10-06 MED ORDER — IOPAMIDOL (ISOVUE-370) INJECTION 76%
100.0000 mL | Freq: Once | INTRAVENOUS | Status: AC | PRN
  Administered 2017-10-06: 100 mL via INTRAVENOUS

## 2017-10-06 MED ORDER — IOPAMIDOL (ISOVUE-370) INJECTION 76%
INTRAVENOUS | Status: AC
Start: 2017-10-06 — End: 2017-10-06
  Filled 2017-10-06: qty 100

## 2017-10-07 ENCOUNTER — Telehealth: Payer: Self-pay | Admitting: Nurse Practitioner

## 2017-10-07 DIAGNOSIS — E782 Mixed hyperlipidemia: Secondary | ICD-10-CM

## 2017-10-07 MED ORDER — ROSUVASTATIN CALCIUM 10 MG PO TABS: 10.0000 mg | ORAL_TABLET | Freq: Every day | ORAL | 3 refills | Status: DC

## 2017-10-07 NOTE — Telephone Encounter (Signed)
Reviewed results and plan of care with patient who verbalized understanding and agreement. She is scheduled for repeat lab appointment on 10/17 and will call back with questions or concerns prior to September f/u appointment with Dr. Elease HashimotoNahser. She thanked me for the call.

## 2017-10-07 NOTE — Telephone Encounter (Signed)
-----   Message from Vesta MixerPhilip J Nahser, MD sent at 10/06/2017  3:32 PM EDT ----- She has mild CAD.  Non obstructive.  She had very high LDL chol levels in Feb. 2019. Please start crestor 10 mg a day . Recheck labs in 3 months

## 2017-11-25 ENCOUNTER — Ambulatory Visit: Payer: BLUE CROSS/BLUE SHIELD | Admitting: Family Medicine

## 2017-11-25 ENCOUNTER — Encounter: Payer: Self-pay | Admitting: Family Medicine

## 2017-11-25 VITALS — BP 130/74 | HR 76 | Ht 62.0 in | Wt 234.0 lb

## 2017-11-25 DIAGNOSIS — S90851A Superficial foreign body, right foot, initial encounter: Secondary | ICD-10-CM | POA: Diagnosis not present

## 2017-11-25 DIAGNOSIS — M79671 Pain in right foot: Secondary | ICD-10-CM | POA: Diagnosis not present

## 2017-11-25 MED ORDER — CEPHALEXIN 500 MG PO CAPS: 500.0000 mg | ORAL_CAPSULE | Freq: Two times a day (BID) | ORAL | 0 refills | Status: DC

## 2017-11-25 NOTE — Progress Notes (Addendum)
Amanda [name] - 52 y.o. female MRN [id number]  Date of birth: 10/26/1965  SUBJECTIVE:  Including CC & ROS.  Chief Complaint  Patient presents with  . Foot Pain    Amanda [name] is a 52 y.o. female that is presenting with right foot pain. Pain has been ongoing for four months. She does not recall stepping on anything. She went to Urgent care on 10/05/17 a foreign body was removed was performed and a foreign body was removed. Pain is mild to severe when standing and bearing weight. Admits to pain and tenderness. 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 Denies injury or trauma.   Independent review of the right foot x-ray from 7/10 shows no acute abnormality.   Review of Systems  Constitutional: Negative for fever.  HENT: Negative for congestion.   Respiratory: Negative for cough.   Cardiovascular: Negative for chest pain.  Gastrointestinal: Negative for abdominal pain.  Musculoskeletal: Positive for gait problem.  Skin: Negative for color change.  Neurological: Negative for weakness.  Hematological: Negative for adenopathy.  Psychiatric/Behavioral: Negative for agitation.    HISTORY: Past Medical, Surgical, Social, and Family History Reviewed & Updated per EMR.   Pertinent Historical Findings include:  Past Medical History:  Diagnosis Date  . Allergy   . Diverticulitis   . GERD (gastroesophageal reflux disease) 1993  . Hypertension 1993  . Sleep apnea   . Vitamin D deficiency 2011    Past Surgical History:  Procedure Laterality Date  . ABDOMINAL HYSTERECTOMY    . CHOLECYSTECTOMY    . CHOLECYSTECTOMY, LAPAROSCOPIC    . TUBAL LIGATION      No Known Allergies  Family History  Problem Relation Age of Onset  . Hypertension Mother   . Lupus Mother   . Hypertension Father   . Diabetes Father   . Heart disease Father   . Prostate cancer Father   . Asthma Brother   . Asthma Sister   . Cancer Sister        breast cancer   . Breast cancer  Sister [id number]  . Diabetes Sister   . Diabetes Brother   . Hypertension Sister   . Hypertension Brother      Social History   Socioeconomic History  . Marital status: Widowed    Spouse name: Not on file  . Number of children: 2  . Years of education: GED  . Highest education level: Not on file  Occupational History  . Occupation: Location managerMachine Operator  Social Needs  . Financial resource strain: Not on file  . Food insecurity:    Worry: Not on file    Inability: Not on file  . Transportation needs:    Medical: Not on file    Non-medical: Not on file  Tobacco Use  . Smoking status: Never Smoker  . Smokeless tobacco: Never Used  Substance and Sexual Activity  . Alcohol use: No  . Drug use: No  . Sexual activity: Not on file  Lifestyle  . Physical activity:    Days per week: Not on file    Minutes per session: Not on file  . Stress: Not on file  Relationships  . Social connections:    Talks on phone: Not on file    Gets together: Not on file    Attends religious service: Not on file    Active member of club or organization: Not on file    Attends meetings of clubs or organizations: Not on file  Relationship status: Not on file  . Intimate partner violence:    Fear of current or ex partner: Not on file    Emotionally abused: Not on file    Physically abused: Not on file    Forced sexual activity: Not on file  Other Topics Concern  . Not on file  Social History Narrative   Lives in [place] with her sister. She works at [employer] living.    Drinks about 2 caffeine drinks a week      PHYSICAL EXAM:  VS: BP 130/74 (BP Location: Left Arm, Patient Position: Sitting, Cuff Size: Normal)   Pulse 76   Ht 5\' 2"  (1.575 m)   Wt 234 lb (106.1 kg)   SpO2 98%   BMI 42.80 kg/m  Physical Exam Gen: NAD, alert, cooperative with exam, well-appearing ENT: normal lips, normal nasal mucosa,  Eye: normal EOM, normal conjunctiva and lids CV:  no edema, +2 pedal pulses     Resp: no accessory muscle use, non-labored,  Skin: no rashes, no areas of induration  Neuro: normal tone, normal sensation to touch Psych:  normal insight, alert and oriented MSK:  Right foot:  Callus on the plantar aspect of the forefoot.  TTP over this area  No redness or streaking  Neurovascularly intact   Limited ultrasound: Right foot:  No abnormality or foreign body with scanning of the callus   Summary: normal exam.   Ultrasound and interpretation by Clare Gandy, MD       Foreign body removal:   The affected area was cleaned and draped in a sterile fashion. Anesthesia was achieved using 5 mL of 1% Lidocaine without epinephrine injected around the wound area using a 30-guage 0.5 inch needle. An 10-blade scalpel was used to debride the callus and cut down observe the foreign body. Pick ups were use as well as an 18 gauge needle to remove two thin black strings.  A sterile dressing was applied to the area.  The patient tolerated the procedure well. No complications were encountered.     ASSESSMENT & PLAN:   Right foot pain Related to the foreign body that was removed.  - foreign body removal  - keflex.  - counseled on supportive care and follow up

## 2017-11-25 NOTE — Assessment & Plan Note (Signed)
Related to the foreign body that was removed.  - foreign body removal  - keflex.  - counseled on supportive care and follow up

## 2017-11-25 NOTE — Patient Instructions (Signed)
Good to see you  Please try to soak your foot and keep it clean  Please follow up with me if you have any increasing pain or redness.

## 2017-12-09 ENCOUNTER — Other Ambulatory Visit: Payer: Self-pay | Admitting: Family Medicine

## 2017-12-09 ENCOUNTER — Telehealth: Payer: Self-pay | Admitting: Nurse Practitioner

## 2017-12-09 NOTE — Telephone Encounter (Signed)
Prilosec refill. Pt asking for a partial refill until appt on 12/29/17.  Last Refilled by a different provider. Last OV: 06/24/17 PCP: Gustavus BryantAshleigh Shamley,NP [facility] [address] in  [place], [place]

## 2017-12-09 NOTE — Telephone Encounter (Signed)
Copied from CRM [phone]#[id number]. Topic: Quick Communication - Rx Refill/Question >> Dec 09, 2017  3:34 PM Angela NevinWilliams, Candice N wrote: Medication: omeprazole (PRILOSEC) 20 MG capsule [[id number]][[id number]]   Pt called stating that she has 8 tablets left and would like to know if she could get a partial refill until her next appointment on 10/3. Please advise.   Preferred Pharmacy (with phone number or street name): [facility] [address] - [place], [place] - [address] [phone] (Phone) [phone] (Fax)

## 2017-12-12 MED ORDER — OMEPRAZOLE 20 MG PO CPDR: 20.0000 mg | DELAYED_RELEASE_CAPSULE | Freq: Every day | ORAL | 0 refills | Status: DC

## 2017-12-12 NOTE — Telephone Encounter (Addendum)
Refill sent. See meds. Left detailed message informing pt.  

## 2017-12-22 ENCOUNTER — Ambulatory Visit: Payer: PRIVATE HEALTH INSURANCE | Admitting: Cardiovascular Disease

## 2017-12-22 ENCOUNTER — Encounter: Payer: Self-pay | Admitting: Cardiovascular Disease

## 2017-12-22 VITALS — BP 128/72 | HR 64 | Ht 62.0 in | Wt 236.8 lb

## 2017-12-22 DIAGNOSIS — E782 Mixed hyperlipidemia: Secondary | ICD-10-CM

## 2017-12-22 DIAGNOSIS — I2510 Atherosclerotic heart disease of native coronary artery without angina pectoris: Secondary | ICD-10-CM | POA: Diagnosis not present

## 2017-12-22 LAB — LIPID PANEL
CHOL/HDL RATIO: 3 ratio (ref 0.0–4.4)
CHOLESTEROL TOTAL: 146 mg/dL (ref 100–199)
HDL: 49 mg/dL (ref >39)
LDL Calculated: 79 mg/dL (ref 0–99)
TRIGLYCERIDES: 92 mg/dL (ref 0–149)
VLDL Cholesterol Cal: 18 mg/dL (ref 5–40)

## 2017-12-22 LAB — BASIC METABOLIC PANEL
BUN/Creatinine Ratio: 12 (ref 9–23)
BUN: 11 mg/dL (ref 6–24)
CHLORIDE: 105 mmol/L (ref 96–106)
CO2: 23 mmol/L (ref 20–29)
Calcium: 9.1 mg/dL (ref 8.7–10.2)
Creatinine, Ser: 0.93 mg/dL (ref 0.57–1.00)
GFR calc Af Amer: 83 mL/min/{1.73_m2} (ref >59)
GFR calc non Af Amer: 72 mL/min/{1.73_m2} (ref >59)
GLUCOSE: 104 mg/dL — AB (ref 65–99)
POTASSIUM: 4 mmol/L (ref 3.5–5.2)
SODIUM: 142 mmol/L (ref 134–144)

## 2017-12-22 LAB — HEPATIC FUNCTION PANEL
ALK PHOS: 62 IU/L (ref 39–117)
ALT: 27 IU/L (ref 0–32)
AST: 23 IU/L (ref 0–40)
Albumin: 4.5 g/dL (ref 3.5–5.5)
Bilirubin Total: 0.5 mg/dL (ref 0.0–1.2)
Bilirubin, Direct: 0.15 mg/dL (ref 0.00–0.40)
Total Protein: 6.6 g/dL (ref 6.0–8.5)

## 2017-12-22 NOTE — Progress Notes (Signed)
Cardiology Office Note:    Date:  12/22/2017   ID:  Amanda [name], DOB [birth year], MRN [id number]  PCP:  Evaristo Bury, NP  Cardiologist:   New to [facility]   Referring MD: Evaristo Bury, NP   Chief Complaint  Patient presents with  . Coronary Artery Disease       Amanda [name] is a 52 y.o. female with a hx of hypertension.  We are asked to see her today for further evaluation of chest discomfort by Dr.  Aura Camps   Has had palpitations for the past 5 years.  Associated with near syncope Has occasional CP.    Wakes up with chest pain .   Early morning hours or middle of the night .  Not exertional . Does not get any regular exercise.   Just bought a treadmill .   Has not started using it yet   Is eating more salt than usual.   earting more fast foods.   Having to take care of her mom.  Her father is now in a memory care unit.  Looking for assisted living care.     Sept. 26, , 2019 Amanda [name] presented to me several months ago with some atypical chest pain.  We performed a coronary CT angiogram.  She was found to have mild coronary artery disease involving the left anterior descending artery.  Her coronary calcium score was 1.  Past Medical History:  Diagnosis Date  . Allergy   . Diverticulitis   . GERD (gastroesophageal reflux disease) 1993  . Hypertension 1993  . Sleep apnea   . Vitamin D deficiency 2011    Past Surgical History:  Procedure Laterality Date  . ABDOMINAL HYSTERECTOMY    . CHOLECYSTECTOMY    . CHOLECYSTECTOMY, LAPAROSCOPIC    . TUBAL LIGATION      Current Medications: Current Meds  Medication Sig  . cetirizine (ZYRTEC) 10 MG tablet TAKE 1 TABLET BY MOUTH ONCE DAILY  . losartan (COZAAR) 100 MG tablet Take 1 tablet (100 mg total) by mouth daily.  Marland Kitchen omeprazole (PRILOSEC) 20 MG capsule Take 1 capsule (20 mg total) by mouth daily.  . rosuvastatin (CRESTOR) 10 MG tablet Take 1 tablet (10 mg total) by mouth daily.     Allergies:    Patient has no known allergies.   Social History   Socioeconomic History  . Marital status: Widowed    Spouse name: Not on file  . Number of children: 2  . Years of education: GED  . Highest education level: Not on file  Occupational History  . Occupation: Location manager  Social Needs  . Financial resource strain: Not on file  . Food insecurity:    Worry: Not on file    Inability: Not on file  . Transportation needs:    Medical: Not on file    Non-medical: Not on file  Tobacco Use  . Smoking status: Never Smoker  . Smokeless tobacco: Never Used  Substance and Sexual Activity  . Alcohol use: No  . Drug use: No  . Sexual activity: Not on file  Lifestyle  . Physical activity:    Days per week: Not on file    Minutes per session: Not on file  . Stress: Not on file  Relationships  . Social connections:    Talks on phone: Not on file    Gets together: Not on file    Attends religious service: Not on file    Active member of  club or organization: Not on file    Attends meetings of clubs or organizations: Not on file    Relationship status: Not on file  Other Topics Concern  . Not on file  Social History Narrative   Lives in [place] with her sister. She works at [employer] living.    Drinks about 2 caffeine drinks a week      Family History: The patient's family history includes Asthma in her brother and sister; Breast cancer (age of onset: 45) in her sister; Cancer in her sister; Diabetes in her brother, father, and sister; Heart disease in her father; Hypertension in her brother, father, mother, and sister; Lupus in her mother; Prostate cancer in her father.  ROS:   Please see the history of present illness.     All other systems reviewed and are negative.  EKGs/Labs/Other Studies Reviewed:    The following studies were reviewed today:   EKG: Normal sinus rhythm at 66.  Nonspecific ST and T wave changes.  Recent Labs: 05/20/2017: ALT 25; BUN 11;  Creatinine, Ser 0.88; Hemoglobin 13.2; Platelets 277.0; Potassium 3.5; Sodium 141; TSH 1.63  Recent Lipid Panel    Component Value Date/Time   CHOL 218 (H) 05/20/2017 1209   TRIG 83.0 05/20/2017 1209   HDL 47.70 05/20/2017 1209   CHOLHDL 5 05/20/2017 1209   VLDL 16.6 05/20/2017 1209   LDLCALC 154 (H) 05/20/2017 1209    Physical Exam: Blood pressure 128/72, pulse 64, height 5\' 2"  (1.575 m), weight 236 lb 12.8 oz (107.4 kg), SpO2 97 %.  GEN:  Well nourished, well developed in no acute distress HEENT: Normal NECK: No JVD; No carotid bruits LYMPHATICS: No lymphadenopathy CARDIAC: RRR, no murmurs, rubs, gallops RESPIRATORY:  Clear to auscultation without rales, wheezing or rhonchi  ABDOMEN: Soft, non-tender, non-distended MUSCULOSKELETAL:  No edema; No deformity  SKIN: Warm and dry NEUROLOGIC:  Alert and oriented x 3   ASSESSMENT:    1. Coronary artery disease involving native coronary artery of native heart without angina pectoris   2. Mixed hyperlipidemia    PLAN:     1. Chest pain:    No further episodes of CP .  2.  Hypertension:    BP is well controlled.   3.  Hyperlipidemia:  We have started Crestor 10 mg a day  Needs to have an LDL < 70 Check labs today   Medication Adjustments/Labs and Tests Ordered: Current medicines are reviewed at length with the patient today.  Concerns regarding medicines are outlined above.  Orders Placed This Encounter  Procedures  . Hepatic function panel  . Basic Metabolic Panel (BMET)  . Lipid Profile   No orders of the defined types were placed in this encounter.     Patient Instructions  Medication Instructions:  Your physician recommends that you continue on your current medications as directed. Please refer to the Current Medication list given to you today.   Labwork: TODAY - liver panel, cholesterol, basic metabolic panel   Testing/Procedures: None Ordered   Follow-Up: Your physician wants you to follow-up in: 6  months with Lizabeth Leyden, NP. You will receive a reminder letter in the mail two months in advance. If you don't receive a letter, please call our office to schedule the follow-up appointment.   If you need a refill on your cardiac medications before your next appointment, please call your pharmacy.   Thank you for choosing [facility]! Eligha Bridegroom, RN [phone]  Signed, Kristeen Miss, MD  12/22/2017 1:32 PM    [facility]

## 2017-12-22 NOTE — Patient Instructions (Signed)
Medication Instructions:  Your physician recommends that you continue on your current medications as directed. Please refer to the Current Medication list given to you today.   Labwork: TODAY - liver panel, cholesterol, basic metabolic panel   Testing/Procedures: None Ordered   Follow-Up: Your physician wants you to follow-up in: 6 months with Lizabeth Leyden, NP. You will receive a reminder letter in the mail two months in advance. If you don't receive a letter, please call our office to schedule the follow-up appointment.   If you need a refill on your cardiac medications before your next appointment, please call your pharmacy.   Thank you for choosing [facility]! Eligha Bridegroom, RN [phone]

## 2017-12-27 ENCOUNTER — Ambulatory Visit: Payer: BLUE CROSS/BLUE SHIELD | Admitting: Nurse Practitioner

## 2017-12-29 ENCOUNTER — Ambulatory Visit: Payer: BLUE CROSS/BLUE SHIELD | Admitting: Nurse Practitioner

## 2018-01-06 ENCOUNTER — Ambulatory Visit: Payer: PRIVATE HEALTH INSURANCE | Admitting: Nurse Practitioner

## 2018-01-06 ENCOUNTER — Encounter: Payer: Self-pay | Admitting: Nurse Practitioner

## 2018-01-06 VITALS — BP 118/84 | HR 62 | Ht 62.0 in | Wt 236.0 lb

## 2018-01-06 DIAGNOSIS — E782 Mixed hyperlipidemia: Secondary | ICD-10-CM | POA: Insufficient documentation

## 2018-01-06 DIAGNOSIS — R7303 Prediabetes: Secondary | ICD-10-CM

## 2018-01-06 DIAGNOSIS — E785 Hyperlipidemia, unspecified: Secondary | ICD-10-CM | POA: Insufficient documentation

## 2018-01-06 DIAGNOSIS — Z23 Encounter for immunization: Secondary | ICD-10-CM

## 2018-01-06 LAB — POCT GLYCOSYLATED HEMOGLOBIN (HGB A1C): HEMOGLOBIN A1C: 5.2 % (ref 4.0–5.6)

## 2018-01-06 NOTE — Assessment & Plan Note (Signed)
continue rosuvastatin Encouraged healthy diet and exercise- additional education printed on AVS Continue planned F/U with cardiology

## 2018-01-06 NOTE — Progress Notes (Signed)
Amanda [name] is a 52 y.o. female with the following history as recorded in EpicCare:  Patient Active Problem List   Diagnosis Date Noted  . Stress incontinence of urine 06/26/2017  . Encounter for general adult medical examination with abnormal findings 05/20/2017  . Non-seasonal allergic rhinitis 04/14/2017  . Moderate obstructive sleep apnea 05/20/2016  . Recurrent sinus infections 06/12/2014  . GERD (gastroesophageal reflux disease) 03/08/2012  . Benign essential hypertension 03/08/2012    Current Outpatient Medications  Medication Sig Dispense Refill  . cetirizine (ZYRTEC) 10 MG tablet TAKE 1 TABLET BY MOUTH ONCE DAILY 90 tablet 1  . losartan (COZAAR) 100 MG tablet Take 1 tablet (100 mg total) by mouth daily. 90 tablet 3  . omeprazole (PRILOSEC) 20 MG capsule Take 1 capsule (20 mg total) by mouth daily. 30 capsule 0  . rosuvastatin (CRESTOR) 10 MG tablet Take 1 tablet (10 mg total) by mouth daily. 90 tablet 3   No current facility-administered medications for this visit.     Allergies: Patient has no known allergies.  Past Medical History:  Diagnosis Date  . Allergy   . Diverticulitis   . GERD (gastroesophageal reflux disease) 1993  . Hypertension 1993  . Sleep apnea   . Vitamin D deficiency 2011    Past Surgical History:  Procedure Laterality Date  . ABDOMINAL HYSTERECTOMY    . CHOLECYSTECTOMY    . CHOLECYSTECTOMY, LAPAROSCOPIC    . TUBAL LIGATION      Family History  Problem Relation Age of Onset  . Hypertension Mother   . Lupus Mother   . Hypertension Father   . Diabetes Father   . Heart disease Father   . Prostate cancer Father   . Asthma Brother   . Asthma Sister   . Cancer Sister        breast cancer   . Breast cancer Sister 39  . Diabetes Sister   . Diabetes Brother   . Hypertension Sister   . Hypertension Brother     Social History   Tobacco Use  . Smoking status: Never Smoker  . Smokeless tobacco: Never Used  Substance Use Topics  .  Alcohol use: No     Subjective:  Amanda [name] is here today for follow up of pre-diabetes, hyperlipidemia, noted on CPE labs 05/20/17, she was advised to work on healthy diet, exercise with plan to recheck labs in 3-6 months. Since February, she has established care with cardiology who is now following her for CAD, HLD, started rosuvastatin in July and had a repeat lipid panel in September with good improvement in her cholesterol, plans to see cardiology again in 6 months. She tells me today she has not really made any changes to diet, exercise, but did start walking on the treadmill 3 days ago and plans to continue. She is overall feeling well, no complaints today.  Lipid Panel     Component Value Date/Time   CHOL 146 12/22/2017 0934   TRIG 92 12/22/2017 0934   HDL 49 12/22/2017 0934   CHOLHDL 3.0 12/22/2017 0934   CHOLHDL 5 05/20/2017 1209   VLDL 16.6 05/20/2017 1209   LDLCALC 79 12/22/2017 0934   Lab Results  Component Value Date   HGBA1C 5.2 01/06/2018   ROS- See HPI  Objective:  Vitals:   01/06/18 0932  BP: 118/84  Pulse: 62  SpO2: 98%  Weight: 236 lb (107 kg)  Height: 5\' 2"  (1.575 m)    General: Well developed, well nourished, in  no acute distress  Skin : Warm and dry.  Head: Normocephalic and atraumatic  Eyes: Sclera and conjunctiva clear; pupils round and reactive to light; extraocular movements intact  Oropharynx: Pink, supple.  Neck: Supple  Lungs: Respirations unlabored; clear to auscultation bilaterally without wheeze, rales, rhonchi  CVS exam: normal rate, regular rhythm, normal S1, S2, no murmurs, rubs, clicks or gallops.  Extremities: No edema, cyanosis Vessels: Symmetric bilaterally  Neurologic: Alert and oriented; speech intact; face symmetrical; moves all extremities well; CNII-XII intact without focal deficit    Assessment:  1. Pre-diabetes   2. Need for influenza vaccination   3. Hyperlipidemia, unspecified hyperlipidemia type     Plan:   Return  in about 6 months (around 07/08/2018) for CPE.  Orders Placed This Encounter  Procedures  . Flu Vaccine QUAD 36+ mos IM  . POCT glycosylated hemoglobin (Hb A1C)    Requested Prescriptions    No prescriptions requested or ordered in this encounter    Need for influenza vaccination- Flu Vaccine QUAD 36+ mos IM  Pre-diabetes A1c has improved Encouraged healthy diet and exercise- additional education printed on AVS Will repeat A1c at next CPE for monitoring - POCT glycosylated hemoglobin (Hb A1C) Lab Results  Component Value Date   HGBA1C 5.2 01/06/2018

## 2018-01-06 NOTE — Patient Instructions (Addendum)
Please continue to work on healthy diet and exercise  It was good to see you!   Mediterranean Diet A Mediterranean diet refers to food and lifestyle choices that are based on the traditions of countries located on the [employer]. This way of eating has been shown to help prevent certain conditions and improve outcomes for people who have chronic diseases, like kidney disease and heart disease. What are tips for following this plan? Lifestyle  Cook and eat meals together with your family, when possible.  Drink enough fluid to keep your urine clear or pale yellow.  Be physically active every day. This includes: ? Aerobic exercise like running or swimming. ? Leisure activities like gardening, walking, or housework.  Get 7-8 hours of sleep each night.  If recommended by your health care provider, drink red wine in moderation. This means 1 glass a day for nonpregnant women and 2 glasses a day for men. A glass of wine equals 5 oz (150 mL). Reading food labels  Check the serving size of packaged foods. For foods such as rice and pasta, the serving size refers to the amount of cooked product, not dry.  Check the total fat in packaged foods. Avoid foods that have saturated fat or trans fats.  Check the ingredients list for added sugars, such as corn syrup. Shopping  At the grocery store, buy most of your food from the areas near the walls of the store. This includes: ? Fresh fruits and vegetables (produce). ? Grains, beans, nuts, and seeds. Some of these may be available in unpackaged forms or large amounts (in bulk). ? Fresh seafood. ? Poultry and eggs. ? Low-fat dairy products.  Buy whole ingredients instead of prepackaged foods.  Buy fresh fruits and vegetables in-season from local farmers markets.  Buy frozen fruits and vegetables in resealable bags.  If you do not have access to quality fresh seafood, buy precooked frozen shrimp or canned fish, such as tuna, salmon, or  sardines.  Buy small amounts of raw or cooked vegetables, salads, or olives from the deli or salad bar at your store.  Stock your pantry so you always have certain foods on hand, such as olive oil, canned tuna, canned tomatoes, rice, pasta, and beans. Cooking  Cook foods with extra-virgin olive oil instead of using butter or other vegetable oils.  Have meat as a side dish, and have vegetables or grains as your main dish. This means having meat in small portions or adding small amounts of meat to foods like pasta or stew.  Use beans or vegetables instead of meat in common dishes like chili or lasagna.  Experiment with different cooking methods. Try roasting or broiling vegetables instead of steaming or sauteing them.  Add frozen vegetables to soups, stews, pasta, or rice.  Add nuts or seeds for added healthy fat at each meal. You can add these to yogurt, salads, or vegetable dishes.  Marinate fish or vegetables using olive oil, lemon juice, garlic, and fresh herbs. Meal planning  Plan to eat 1 vegetarian meal one day each week. Try to work up to 2 vegetarian meals, if possible.  Eat seafood 2 or more times a week.  Have healthy snacks readily available, such as: ? Vegetable sticks with hummus. ? [place] yogurt. ? Fruit and nut trail mix.  Eat balanced meals throughout the week. This includes: ? Fruit: 2-3 servings a day ? Vegetables: 4-5 servings a day ? Low-fat dairy: 2 servings a day ? Fish, poultry, or  lean meat: 1 serving a day ? Beans and legumes: 2 or more servings a week ? Nuts and seeds: 1-2 servings a day ? Whole grains: 6-8 servings a day ? Extra-virgin olive oil: 3-4 servings a day  Limit red meat and sweets to only a few servings a month What are my food choices?  Mediterranean diet ? Recommended ? Grains: Whole-grain pasta. Brown rice. Bulgar wheat. Polenta. Couscous. Whole-wheat bread. [place]. ? Vegetables: Artichokes. Beets. Broccoli. Cabbage.  Carrots. Eggplant. Green beans. Chard. Kale. Spinach. Onions. Leeks. Peas. Squash. Tomatoes. Peppers. Radishes. ? Fruits: Apples. Apricots. Avocado. Berries. Bananas. Cherries. Dates. Figs. Grapes. Lemons. Melon. Oranges. Peaches. Plums. Pomegranate. ? Meats and other protein foods: Beans. Almonds. Sunflower seeds. Pine nuts. Peanuts. [place]. Salmon. Scallops. Shrimp. [place]. Tilapia. Clams. Oysters. Eggs. ? Dairy: Low-fat milk. Cheese. Greek yogurt. ? Beverages: Water. Red wine. Herbal tea. ? Fats and oils: Extra virgin olive oil. Avocado oil. Grape seed oil. ? Sweets and desserts: [place] yogurt with honey. Baked apples. Poached pears. Trail mix. ? Seasoning and other foods: Basil. Cilantro. Coriander. Cumin. Mint. Parsley. Sage. Rosemary. Tarragon. Garlic. Oregano. Thyme. Pepper. Balsalmic vinegar. Tahini. Hummus. Tomato sauce. Olives. Mushrooms. ? Limit these ? Grains: Prepackaged pasta or rice dishes. Prepackaged cereal with added sugar. ? Vegetables: Deep fried potatoes (french fries). ? Fruits: Fruit canned in syrup. ? Meats and other protein foods: Beef. Pork. Lamb. Poultry with skin. Hot dogs. [place] Salines. ? Dairy: Ice cream. Sour cream. Whole milk. ? Beverages: Juice. Sugar-sweetened soft drinks. Beer. Liquor and spirits. ? Fats and oils: Butter. Canola oil. Vegetable oil. Beef fat (tallow). Lard. ? Sweets and desserts: Cookies. Cakes. Pies. Candy. ? Seasoning and other foods: Mayonnaise. Premade sauces and marinades. ? The items listed may not be a complete list. Talk with your dietitian about what dietary choices are right for you. Summary  The Mediterranean diet includes both food and lifestyle choices.  Eat a variety of fresh fruits and vegetables, beans, nuts, seeds, and whole grains.  Limit the amount of red meat and sweets that you eat.  Talk with your health care provider about whether it is safe for you to drink red wine in moderation. This means 1 glass a day for nonpregnant women  and 2 glasses a day for men. A glass of wine equals 5 oz (150 mL). This information is not intended to replace advice given to you by your health care provider. Make sure you discuss any questions you have with your health care provider. Document Released: 11/06/2015 Document Revised: 12/09/2015 Document Reviewed: 11/06/2015 Elsevier Interactive Patient Education  [employer].

## 2018-01-12 ENCOUNTER — Other Ambulatory Visit: Payer: BLUE CROSS/BLUE SHIELD

## 2018-01-13 ENCOUNTER — Other Ambulatory Visit: Payer: Self-pay | Admitting: Nurse Practitioner

## 2018-02-03 ENCOUNTER — Other Ambulatory Visit: Payer: Self-pay | Admitting: Nurse Practitioner

## 2018-02-03 DIAGNOSIS — Z1231 Encounter for screening mammogram for malignant neoplasm of breast: Secondary | ICD-10-CM

## 2018-02-16 ENCOUNTER — Ambulatory Visit: Payer: BLUE CROSS/BLUE SHIELD | Admitting: Adult Health

## 2018-03-01 ENCOUNTER — Other Ambulatory Visit: Payer: Self-pay | Admitting: Nurse Practitioner

## 2018-03-17 ENCOUNTER — Ambulatory Visit: Payer: PRIVATE HEALTH INSURANCE

## 2018-04-06 ENCOUNTER — Encounter (INDEPENDENT_AMBULATORY_CARE_PROVIDER_SITE_OTHER): Payer: Self-pay

## 2018-04-06 ENCOUNTER — Ambulatory Visit: Payer: PRIVATE HEALTH INSURANCE | Admitting: Cardiology

## 2018-04-06 ENCOUNTER — Encounter: Payer: Self-pay | Admitting: Cardiology

## 2018-04-06 VITALS — BP 132/80 | HR 88 | Ht 62.0 in | Wt 233.4 lb

## 2018-04-06 DIAGNOSIS — E785 Hyperlipidemia, unspecified: Secondary | ICD-10-CM

## 2018-04-06 DIAGNOSIS — I10 Essential (primary) hypertension: Secondary | ICD-10-CM | POA: Diagnosis not present

## 2018-04-06 DIAGNOSIS — R0789 Other chest pain: Secondary | ICD-10-CM | POA: Diagnosis not present

## 2018-04-06 NOTE — Progress Notes (Signed)
Cardiology Office Note:    Date:  04/06/2018   ID:  SHERALD BALBUENA, DOB [birth year], MRN [id number]  PCP:  Evaristo Bury, NP  Cardiologist:  Kristeen Miss, MD  Referring MD: Evaristo Bury, NP   Chief Complaint  Patient presents with  . Follow-up    Prior chest pain    History of Present Illness:    Amanda [name] is a 53 y.o. female with a past medical history significant for hypertension, palpitations and hyperlipidemia. Coronary calcium score of 1.  Now has only rare palpitations mainly when she is stressed or upset. No chest pain, pressure, tightness, shortness of breath, lightheadedness or syncope. No edema.   She admits that she used to eat out a lot and has been cutting down. She is trying to eat more salads and baked chicken. She is going to a gym on most days, except Sunday. Treadmill and bike 30 minutes. She has eliminated [facility] and is trying to drink more water.   Past Medical History:  Diagnosis Date  . Allergy   . Diverticulitis   . GERD (gastroesophageal reflux disease) 1993  . Hypertension 1993  . Sleep apnea   . Vitamin D deficiency 2011    Past Surgical History:  Procedure Laterality Date  . ABDOMINAL HYSTERECTOMY    . CHOLECYSTECTOMY    . CHOLECYSTECTOMY, LAPAROSCOPIC    . TUBAL LIGATION      Current Medications: Current Meds  Medication Sig  . EQ ALLERGY RELIEF, CETIRIZINE, 10 MG tablet TAKE 1 TABLET BY MOUTH ONCE DAILY  . guaiFENesin (MUCINEX PO) Take one tablet by mouth( 30-600 mg) OTC decongestant  once every 12 hours  . losartan (COZAAR) 100 MG tablet Take 1 tablet (100 mg total) by mouth daily.  Marland Kitchen omeprazole (PRILOSEC) 20 MG capsule TAKE 1 CAPSULE BY MOUTH ONCE DAILY  . rosuvastatin (CRESTOR) 10 MG tablet Take 1 tablet (10 mg total) by mouth daily.     Allergies:   Patient has no known allergies.   Social History   Socioeconomic History  . Marital status: Widowed    Spouse name: Not on file  . Number of  children: 2  . Years of education: GED  . Highest education level: Not on file  Occupational History  . Occupation: Location manager  Social Needs  . Financial resource strain: Not on file  . Food insecurity:    Worry: Not on file    Inability: Not on file  . Transportation needs:    Medical: Not on file    Non-medical: Not on file  Tobacco Use  . Smoking status: Never Smoker  . Smokeless tobacco: Never Used  Substance and Sexual Activity  . Alcohol use: No  . Drug use: No  . Sexual activity: Not on file  Lifestyle  . Physical activity:    Days per week: Not on file    Minutes per session: Not on file  . Stress: Not on file  Relationships  . Social connections:    Talks on phone: Not on file    Gets together: Not on file    Attends religious service: Not on file    Active member of club or organization: Not on file    Attends meetings of clubs or organizations: Not on file    Relationship status: Not on file  Other Topics Concern  . Not on file  Social History Narrative   Lives in [place] with her sister. She works at [employer]  senior living.    Drinks about 2 caffeine drinks a week      Family History: The patient's family history includes Asthma in her brother and sister; Breast cancer (age of onset: 48) in her sister; Cancer in her sister; Diabetes in her brother, father, and sister; Heart disease in her father; Hypertension in her brother, father, mother, and sister; Lupus in her mother; Prostate cancer in her father. ROS:   Please see the history of present illness.     All other systems reviewed and are negative.  EKGs/Labs/Other Studies Reviewed:    The following studies were reviewed today:  Cardiac CTA 10/06/17 IMPRESSION: 1. Coronary calcium score of 1. This was 37 percentile for age and sex matched control. 2. Normal coronary origin with right dominance. 3. Mild non-obstructive CAD in the proximal and mid LAD. Aggressive risk factor modification  is recommended.   EKG:  EKG is not ordered today.    Recent Labs: 05/20/2017: Hemoglobin 13.2; Platelets 277.0; TSH 1.63 12/22/2017: ALT 27; BUN 11; Creatinine, Ser 0.93; Potassium 4.0; Sodium 142   Recent Lipid Panel    Component Value Date/Time   CHOL 146 12/22/2017 0934   TRIG 92 12/22/2017 0934   HDL 49 12/22/2017 0934   CHOLHDL 3.0 12/22/2017 0934   CHOLHDL 5 05/20/2017 1209   VLDL 16.6 05/20/2017 1209   LDLCALC 79 12/22/2017 0934    Physical Exam:    VS:  BP 132/80   Pulse 88   Ht 5\' 2"  (1.575 m)   Wt 233 lb 6.4 oz (105.9 kg)   SpO2 98%   BMI 42.69 kg/m     Wt Readings from Last 3 Encounters:  04/06/18 233 lb 6.4 oz (105.9 kg)  01/06/18 236 lb (107 kg)  12/22/17 236 lb 12.8 oz (107.4 kg)     Physical Exam  Constitutional: She is oriented to person, place, and time. She appears well-developed and well-nourished. No distress.  HENT:  Head: Normocephalic and atraumatic.  Neck: Normal range of motion. Neck supple. No JVD present.  Cardiovascular: Normal rate, regular rhythm, normal heart sounds and intact distal pulses. Exam reveals no gallop and no friction rub.  No murmur heard. Pulmonary/Chest: Effort normal and breath sounds normal. No respiratory distress. She has no wheezes. She has no rales.  Abdominal: Soft. Bowel sounds are normal. She exhibits no distension.  Musculoskeletal: Normal range of motion.        General: No deformity or edema.  Neurological: She is alert and oriented to person, place, and time.  Skin: Skin is warm and dry.  Psychiatric: She has a normal mood and affect. Her behavior is normal. Judgment and thought content normal.  Vitals reviewed.   ASSESSMENT:    1. Other chest pain   2. Essential (primary) hypertension   3. Hyperlipidemia with target LDL less than 100    PLAN:    In order of problems listed above:  Chest pain: No further chest pain. Coronary calcium score of 1. We discussed CVD prevention: -Recommend heart  healthy/Mediterranean diet, with whole grains, fruits, vegetable, fish, lean meats, nuts, and olive oil. Limit salt. -Recommend moderate walking, 3-5 times/week for 30-50 minutes each session. Aim for at least 150 minutes.week. Goal should be pace of 3 miles/hours, or walking 1.5 miles in 30 minutes -Recommend avoidance of tobacco products. Avoid excess alcohol.  Hypertension: On losartan 100 mg daily. BP well controlled.   Hyperlipidemia: LDL 79 down from 154, which is acceptable. We discussed diet and  exercise.    Medication Adjustments/Labs and Tests Ordered: Current medicines are reviewed at length with the patient today.  Concerns regarding medicines are outlined above. Labs and tests ordered and medication changes are outlined in the patient instructions below:  Patient Instructions  Medication Instructions:  .[email or web address]  If you need a refill on your cardiac medications before your next appointment, please call your pharmacy.   Lab work: None  If you have labs (blood work) drawn today and your tests are completely normal, you will receive your results only by: Marland Kitchen. MyChart Message (if you have MyChart) OR . A paper copy in the mail If you have any lab test that is abnormal or we need to change your treatment, we will call you to review the results.  Testing/Procedures: None  Follow-Up: At [facility], you and your health needs are our priority.  As part of our continuing mission to provide you with exceptional heart care, we have created designated Provider Care Teams.  These Care Teams include your primary Cardiologist (physician) and Advanced Practice Providers (APPs -  Physician Assistants and Nurse Practitioners) who all work together to provide you with the care you need, when you need it. You will need a follow up appointment in:  1 years.  Please call our office 2 months in advance to schedule this appointment.  You may see Kristeen MissPhilip Nahser, MD or one of the following Advanced  Practice Providers on your designated Care Team: Tereso NewcomerScott Weaver, PA-C Vin ChiloquinBhagat, [place] JerseyPA-C . Berton BonJanine Aymar Whitfill, NP  Any Other Special Instructions Will Be Listed Below (If Applicable).  Preventing High Cholesterol Cholesterol is a waxy, fat-like substance that your body needs in small amounts. Your liver makes all the cholesterol that your body needs. Having high cholesterol (hypercholesterolemia) increases your risk for heart disease and stroke. Extra (excess) cholesterol comes from the food you eat, such as animal-based fat (saturated fat) from meat and some dairy products. High cholesterol can often be prevented with diet and lifestyle changes. If you already have high cholesterol, you can control it with diet and lifestyle changes, as well as medicine. What nutrition changes can be made?  Eat less saturated fat. Foods that contain saturated fat include red meat and some dairy products.  Avoid processed meats, like bacon and lunch meats.  Avoid trans fats, which are found in margarine and some baked goods.  Avoid foods and beverages that have added sugars.  Eat more fruits, vegetables, and whole grains.  Choose healthy sources of protein, such as fish, poultry, and nuts.  Choose healthy sources of fat, such as: ? Nuts. ? Vegetable oils, especially olive oil. ? Fish that have healthy fats (omega-3 fatty acids), such as mackerel or salmon. What lifestyle changes can be made?   Lose weight if you are overweight. Losing 5-10 lb (2.3-4.5 kg) can help prevent or control high cholesterol and reduce your risk for diabetes and high blood pressure. Ask your health care provider to help you with a diet and exercise plan to safely lose weight.  Get enough exercise. Do at least 150 minutes of moderate-intensity exercise each week. ? You could do this in short exercise sessions several times a day, or you could do longer exercise sessions a few times a week. For example, you could take a brisk  10-minute walk or bike ride, 3 times a day, for 5 days a week.  Do not smoke. If you need help quitting, ask your health care provider.  Limit your alcohol intake.  If you drink alcohol, limit alcohol intake to no more than 1 drink a day for nonpregnant women and 2 drinks a day for men. One drink equals 12 oz of beer, 5 oz of wine, or 1 oz of hard liquor. Why are these changes important?  If you have high cholesterol, deposits (plaques) may build up on the walls of your blood vessels. Plaques make the arteries narrower and stiffer, which can restrict or block blood flow and cause blood clots to form. This greatly increases your risk for heart attack and stroke. Making diet and lifestyle changes can reduce your risk for these life-threatening conditions. What can I do to lower my risk?  Manage your risk factors for high cholesterol. Talk with your health care provider about all of your risk factors and how to lower your risk.  Manage other conditions that you have, such as diabetes or high blood pressure (hypertension).  Have your cholesterol checked at regular intervals.  Keep all follow-up visits as told by your health care provider. This is important. How is this treated? In addition to diet and lifestyle changes, your health care provider may recommend medicines to help lower cholesterol, such as a medicine to reduce the amount of cholesterol made in your liver. You may need medicine if:  Diet and lifestyle changes do not lower your cholesterol enough.  You have high cholesterol and other risk factors for heart disease or stroke. Take over-the-counter and prescription medicines only as told by your health care provider. Where to find more information  American Heart Association: 1122334455www.heart.org/HEARTORG/Conditions/Cholesterol/Cholesterol_UCM_001089_SubHomePage.jsp  National Heart, Lung, and Blood Institute:  http://hood.com/www.nhlbi.nih.gov/health/resources/heart/heart-cholesterol-hbc-what-html Summary  High cholesterol increases your risk for heart disease and stroke. By keeping your cholesterol level low, you can reduce your risk for these conditions.  Diet and lifestyle changes are the most important steps in preventing high cholesterol.  Work with your health care provider to manage your risk factors, and have your blood tested regularly. This information is not intended to replace advice given to you by your health care provider. Make sure you discuss any questions you have with your health care provider. Document Released: 03/30/2015 Document Revised: 11/22/2015 Document Reviewed: 11/22/2015 Elsevier Interactive Patient Education  [address]2019 Elsevier Inc.     Signed, Berton BonJanine Kenyatta Keidel, NP  04/06/2018 6:00 PM    [facility]

## 2018-04-06 NOTE — Patient Instructions (Addendum)
Medication Instructions:  .isntcur  If you need a refill on your cardiac medications before your next appointment, please call your pharmacy.   Lab work: None  If you have labs (blood work) drawn today and your tests are completely normal, you will receive your results only by: Marland Kitchen MyChart Message (if you have MyChart) OR . A paper copy in the mail If you have any lab test that is abnormal or we need to change your treatment, we will call you to review the results.  Testing/Procedures: None  Follow-Up: At [facility], you and your health needs are our priority.  As part of our continuing mission to provide you with exceptional heart care, we have created designated Provider Care Teams.  These Care Teams include your primary Cardiologist (physician) and Advanced Practice Providers (APPs -  Physician Assistants and Nurse Practitioners) who all work together to provide you with the care you need, when you need it. You will need a follow up appointment in:  1 years.  Please call our office 2 months in advance to schedule this appointment.  You may see Kristeen Miss, MD or one of the following Advanced Practice Providers on your designated Care Team: Tereso Newcomer, PA-C [place], [place] . Berton Bon, NP  Any Other Special Instructions Will Be Listed Below (If Applicable).  Preventing High Cholesterol Cholesterol is a waxy, fat-like substance that your body needs in small amounts. Your liver makes all the cholesterol that your body needs. Having high cholesterol (hypercholesterolemia) increases your risk for heart disease and stroke. Extra (excess) cholesterol comes from the food you eat, such as animal-based fat (saturated fat) from meat and some dairy products. High cholesterol can often be prevented with diet and lifestyle changes. If you already have high cholesterol, you can control it with diet and lifestyle changes, as well as medicine. What nutrition changes can be made?  Eat less  saturated fat. Foods that contain saturated fat include red meat and some dairy products.  Avoid processed meats, like bacon and lunch meats.  Avoid trans fats, which are found in margarine and some baked goods.  Avoid foods and beverages that have added sugars.  Eat more fruits, vegetables, and whole grains.  Choose healthy sources of protein, such as fish, poultry, and nuts.  Choose healthy sources of fat, such as: ? Nuts. ? Vegetable oils, especially olive oil. ? Fish that have healthy fats (omega-3 fatty acids), such as mackerel or salmon. What lifestyle changes can be made?   Lose weight if you are overweight. Losing 5-10 lb (2.3-4.5 kg) can help prevent or control high cholesterol and reduce your risk for diabetes and high blood pressure. Ask your health care provider to help you with a diet and exercise plan to safely lose weight.  Get enough exercise. Do at least 150 minutes of moderate-intensity exercise each week. ? You could do this in short exercise sessions several times a day, or you could do longer exercise sessions a few times a week. For example, you could take a brisk 10-minute walk or bike ride, 3 times a day, for 5 days a week.  Do not smoke. If you need help quitting, ask your health care provider.  Limit your alcohol intake. If you drink alcohol, limit alcohol intake to no more than 1 drink a day for nonpregnant women and 2 drinks a day for men. One drink equals 12 oz of beer, 5 oz of wine, or 1 oz of hard liquor. Why are these  changes important?  If you have high cholesterol, deposits (plaques) may build up on the walls of your blood vessels. Plaques make the arteries narrower and stiffer, which can restrict or block blood flow and cause blood clots to form. This greatly increases your risk for heart attack and stroke. Making diet and lifestyle changes can reduce your risk for these life-threatening conditions. What can I do to lower my risk?  Manage your risk  factors for high cholesterol. Talk with your health care provider about all of your risk factors and how to lower your risk.  Manage other conditions that you have, such as diabetes or high blood pressure (hypertension).  Have your cholesterol checked at regular intervals.  Keep all follow-up visits as told by your health care provider. This is important. How is this treated? In addition to diet and lifestyle changes, your health care provider may recommend medicines to help lower cholesterol, such as a medicine to reduce the amount of cholesterol made in your liver. You may need medicine if:  Diet and lifestyle changes do not lower your cholesterol enough.  You have high cholesterol and other risk factors for heart disease or stroke. Take over-the-counter and prescription medicines only as told by your health care provider. Where to find more information  American Heart Association: 1122334455www.heart.org/HEARTORG/Conditions/Cholesterol/Cholesterol_UCM_001089_SubHomePage.jsp  National Heart, Lung, and Blood Institute: http://hood.com/www.nhlbi.nih.gov/health/resources/heart/heart-cholesterol-hbc-what-html Summary  High cholesterol increases your risk for heart disease and stroke. By keeping your cholesterol level low, you can reduce your risk for these conditions.  Diet and lifestyle changes are the most important steps in preventing high cholesterol.  Work with your health care provider to manage your risk factors, and have your blood tested regularly. This information is not intended to replace advice given to you by your health care provider. Make sure you discuss any questions you have with your health care provider. Document Released: 03/30/2015 Document Revised: 11/22/2015 Document Reviewed: 11/22/2015 Elsevier Interactive Patient Education  2019 [employer]

## 2018-04-14 ENCOUNTER — Ambulatory Visit
Admission: RE | Admit: 2018-04-14 | Discharge: 2018-04-14 | Disposition: A | Discharge status: Home / Self Care | Payer: PRIVATE HEALTH INSURANCE | Source: Ambulatory Visit | Attending: Nurse Practitioner | Admitting: Nurse Practitioner

## 2018-04-14 DIAGNOSIS — Z1231 Encounter for screening mammogram for malignant neoplasm of breast: Secondary | ICD-10-CM

## 2018-05-11 ENCOUNTER — Ambulatory Visit: Payer: Self-pay | Admitting: Nurse Practitioner

## 2018-05-26 ENCOUNTER — Encounter: Payer: PRIVATE HEALTH INSURANCE | Admitting: Nurse Practitioner

## 2018-05-31 NOTE — Progress Notes (Addendum)
[facility]  PATIENT: Amanda [name] DOB: May 08, 1965   REASON FOR VISIT: Follow-up for obstructive sleep apnea HISTORY FROM:patient    HISTORY OF PRESENT ILLNESS: Amanda [name] is a 53 year old right-handed woman with an underlying medical history of hypertension, allergies, reflux disease, vitamin D deficiency, and morbid obesity, who presents for follow-up consultation of her sleep disorder, after home sleep study testing and trial of AutoPap therapy. The patient is unaccompanied today. I last saw her on 08/10/2016, at which time she reported doing much better after starting AutoPap therapy. She felt that her sleep quality and sleep consolidation as well as daytime energy were improved as well as her morning headaches. She was motivated to work on her weight loss and continue with AutoPap. I suggested we proceed with an overnight pulse oximetry test to monitor oxygen saturations for 1 night. She had a pulse ox on 08/24/2016 with a total duration of 5 hours and 56 minutes, average oxygen saturation of 97.3%, nadir of 92%.  Today, 02/10/2017: I reviewed her AutoPap compliance data from 01/10/2017 through 02/08/2017 which is a total of 30 days, during which time she used her machine every night with percent used days greater than 4 hours at 90%, indicating excellent compliance with an average usage of 7 hours and 34 minutes, residual AHI at goal at 1.6 per hour, pressure for the 95th percentile right at 12 cm, leak very low, pressure setting of 4-13 cm with EPR. She reports doing well, will start going to the gym. Had a bout of bronchitis, went to [facility] and had a Zpack. Could not use the machine then. Otherwise doing well. Would be okay trying CPAP of 12 cm, using nasal mask.  UPDATE 3/5/2020CM Amanda [name], 53 year old female returns for follow-up with history of obstructive sleep apnea here for CPAP compliance.  She is doing well with her CPAP.  She claims she has more  sleepiness lately.  She was recently switched from an 8-hour shift to a 12-hour shift and she is still adjusting to this.  CPAP data dated 05/01/2018-05/30/2018 shows compliance greater than 4 hours at 83%.  Average usage 7 hours 19 minutes.  Set pressure 12 cm leak 95th percentile 0.9.  AHI 0.9.  The other 5 days she did not use the machine she had a cold.  She returns for reevaluation  REVIEW OF SYSTEMS: Full 14 system review of systems performed and notable only for those listed, all others are neg:  Constitutional: neg  Cardiovascular: neg Ear/Nose/Throat: neg  Skin: neg Eyes: neg Respiratory: neg Gastroitestinal: neg  Hematology/Lymphatic: neg  Endocrine: neg Musculoskeletal:neg Allergy/Immunology: neg Neurological: neg Psychiatric: neg Sleep : Obstructive sleep apnea with CPAP   ALLERGIES: No Known Allergies  HOME MEDICATIONS: Outpatient Medications Prior to Visit  Medication Sig Dispense Refill  . EQ ALLERGY RELIEF, CETIRIZINE, 10 MG tablet TAKE 1 TABLET BY MOUTH ONCE DAILY 90 tablet 0  . losartan (COZAAR) 100 MG tablet Take 1 tablet (100 mg total) by mouth daily. 90 tablet 3  . omeprazole (PRILOSEC) 20 MG capsule TAKE 1 CAPSULE BY MOUTH ONCE DAILY 90 capsule 1  . rosuvastatin (CRESTOR) 10 MG tablet Take 1 tablet (10 mg total) by mouth daily. 90 tablet 3  . guaiFENesin (MUCINEX PO) Take one tablet by mouth( 30-600 mg) OTC decongestant  once every 12 hours     No facility-administered medications prior to visit.     PAST MEDICAL HISTORY: Past Medical History:  Diagnosis Date  . Allergy   .  Diverticulitis   . GERD (gastroesophageal reflux disease) 1993  . Hypertension 1993  . Sleep apnea   . Vitamin D deficiency 2011    PAST SURGICAL HISTORY: Past Surgical History:  Procedure Laterality Date  . ABDOMINAL HYSTERECTOMY    . CHOLECYSTECTOMY    . CHOLECYSTECTOMY, LAPAROSCOPIC    . TUBAL LIGATION      FAMILY HISTORY: Family History  Problem Relation Age of Onset    . Hypertension Mother   . Lupus Mother   . Hypertension Father   . Diabetes Father   . Heart disease Father   . Prostate cancer Father   . Asthma Brother   . Asthma Sister   . Cancer Sister        breast cancer   . Breast cancer Sister 66  . Diabetes Sister   . Diabetes Brother   . Hypertension Sister   . Hypertension Brother     SOCIAL HISTORY: Social History   Socioeconomic History  . Marital status: Widowed    Spouse name: Not on file  . Number of children: 2  . Years of education: GED  . Highest education level: Not on file  Occupational History  . Occupation: Location manager  Social Needs  . Financial resource strain: Not on file  . Food insecurity:    Worry: Not on file    Inability: Not on file  . Transportation needs:    Medical: Not on file    Non-medical: Not on file  Tobacco Use  . Smoking status: Never Smoker  . Smokeless tobacco: Never Used  Substance and Sexual Activity  . Alcohol use: No  . Drug use: No  . Sexual activity: Not on file  Lifestyle  . Physical activity:    Days per week: Not on file    Minutes per session: Not on file  . Stress: Not on file  Relationships  . Social connections:    Talks on phone: Not on file    Gets together: Not on file    Attends religious service: Not on file    Active member of club or organization: Not on file    Attends meetings of clubs or organizations: Not on file    Relationship status: Not on file  . Intimate partner violence:    Fear of current or ex partner: Not on file    Emotionally abused: Not on file    Physically abused: Not on file    Forced sexual activity: Not on file  Other Topics Concern  . Not on file  Social History Narrative   Lives in [place] with her sister. She works at [employer] living.    Drinks about 2 caffeine drinks a week      PHYSICAL EXAM  Vitals:   06/01/18 0856  BP: 125/76  Pulse: 76  Weight: 233 lb 9.6 oz (106 kg)  Height: 5\' 2"  (1.575 m)    Body mass index is 42.73 kg/m.  Generalized: Well developed, morbidly obese female in no acute distress  Head: normocephalic and atraumatic,. Oropharynx benign  Neck: Supple,  Musculoskeletal: No deformity  Skin no rash or edema Neurological examination   Mentation: Alert oriented to time, place, history taking. Attention span and concentration appropriate. Recent and remote memory intact.  Follows all commands speech and language fluent.   Cranial nerve II-XII: Pupils were equal round reactive to light extraocular movements were full, visual field were full on confrontational test. Facial sensation and strength were normal. hearing  was intact to finger rubbing bilaterally. Uvula tongue midline. head turning and shoulder shrug were normal and symmetric.Tongue protrusion into cheek strength was normal. Motor: normal bulk and tone, full strength in the BUE, BLE,  Sensory: normal and symmetric to light touch,  Coordination: finger-nose-finger,  no dysmetria Gait and Station: Rising up from seated position without assistance, normal stance,  moderate stride, good arm swing, smooth turning, able to perform tiptoe, and heel walking without difficulty. Tandem gait is steady  DIAGNOSTIC DATA (LABS, IMAGING, TESTING) - I reviewed patient records, labs, notes, testing and imaging myself where available.  Lab Results  Component Value Date   WBC 6.6 05/20/2017   HGB 13.2 05/20/2017   HCT 39.3 05/20/2017   MCV 88.5 05/20/2017   PLT 277.0 05/20/2017      Component Value Date/Time   NA 142 12/22/2017 0934   K 4.0 12/22/2017 0934   CL 105 12/22/2017 0934   CO2 23 12/22/2017 0934   GLUCOSE 104 (H) 12/22/2017 0934   GLUCOSE 90 05/20/2017 1209   BUN 11 12/22/2017 0934   CREATININE 0.93 12/22/2017 0934   CREATININE 0.85 05/10/2016 1101   CALCIUM 9.1 12/22/2017 0934   PROT 6.6 12/22/2017 0934   ALBUMIN 4.5 12/22/2017 0934   AST 23 12/22/2017 0934   ALT 27 12/22/2017 0934   ALKPHOS 62  12/22/2017 0934   BILITOT 0.5 12/22/2017 0934   GFRNONAA 72 12/22/2017 0934   GFRNONAA 81 05/10/2016 1101   GFRAA 83 12/22/2017 0934   GFRAA >89 05/10/2016 1101   Lab Results  Component Value Date   CHOL 146 12/22/2017   HDL 49 12/22/2017   LDLCALC 79 12/22/2017   TRIG 92 12/22/2017   CHOLHDL 3.0 12/22/2017   Lab Results  Component Value Date   HGBA1C 5.2 01/06/2018   Lab Results  Component Value Date   VITAMINB12 318 01/03/2013   Lab Results  Component Value Date   TSH 1.63 05/20/2017      ASSESSMENT AND PLAN Amanda L Sandersonis a very pleasant 53 year old female with an underlying medical history of hypertension, allergies, reflux disease and morbid obesity,whopresents for follow-up consultation of her obstructive sleep apnea, after home sleep testing on 05/17/2016 confirmed obstructive sleep apnea, O2 nadir was 72%, AHI 13.8 per hour. Her insurance had denied an attended sleep study as well as a CPAP titration study. She has established treatment with AutoPap and is fully compliant with it.CPAP data dated 05/01/2018-05/30/2018 shows compliance greater than 4 hours at 83%.  Average usage 7 hours 19 minutes.  Set pressure 12 cm leak 95th percentile 0.9.  AHI 0.9.  She has complained with some increased sleepiness after being moved from an 8-hour shift to a 12-hour shift.  She has not completely adjusted.   PLAN: CPAP compliance 83% Continue same settings Follow-up yearly I spent 15 min in total face to face time with the patient more than 50% of which was spent counseling and coordination of care, reviewing test results reviewing medications and discussing and reviewing her CPAP compliance with the patient the diagnosis of obstructive sleep apnea.  And further treatment options. , Nilda Riggs, [facility], [facility], APRN  [facility] [address] [place], [place] [address] [phone]  I reviewed the above note and documentation by the Nurse  Practitioner and agree with the history, physical exam, assessment and plan as outlined above. I was immediately available for face-to-face consultation. Huston Foley, MD, PhD [facility])

## 2018-06-01 ENCOUNTER — Ambulatory Visit (INDEPENDENT_AMBULATORY_CARE_PROVIDER_SITE_OTHER): Payer: BLUE CROSS/BLUE SHIELD | Admitting: Nurse Practitioner

## 2018-06-01 ENCOUNTER — Other Ambulatory Visit (INDEPENDENT_AMBULATORY_CARE_PROVIDER_SITE_OTHER): Payer: BLUE CROSS/BLUE SHIELD

## 2018-06-01 ENCOUNTER — Encounter: Payer: Self-pay | Admitting: Nurse Practitioner

## 2018-06-01 VITALS — BP 138/80 | HR 70 | Ht 62.0 in | Wt 234.0 lb

## 2018-06-01 DIAGNOSIS — Z0001 Encounter for general adult medical examination with abnormal findings: Secondary | ICD-10-CM

## 2018-06-01 DIAGNOSIS — E785 Hyperlipidemia, unspecified: Secondary | ICD-10-CM

## 2018-06-01 DIAGNOSIS — G4733 Obstructive sleep apnea (adult) (pediatric): Secondary | ICD-10-CM | POA: Insufficient documentation

## 2018-06-01 DIAGNOSIS — Z9989 Dependence on other enabling machines and devices: Secondary | ICD-10-CM

## 2018-06-01 DIAGNOSIS — R7303 Prediabetes: Secondary | ICD-10-CM | POA: Diagnosis not present

## 2018-06-01 DIAGNOSIS — I10 Essential (primary) hypertension: Secondary | ICD-10-CM

## 2018-06-01 DIAGNOSIS — K219 Gastro-esophageal reflux disease without esophagitis: Secondary | ICD-10-CM

## 2018-06-01 DIAGNOSIS — Z0000 Encounter for general adult medical examination without abnormal findings: Secondary | ICD-10-CM

## 2018-06-01 DIAGNOSIS — L659 Nonscarring hair loss, unspecified: Secondary | ICD-10-CM | POA: Diagnosis not present

## 2018-06-01 LAB — CBC
HCT: 37.8 % (ref 36.0–46.0)
Hemoglobin: 13.1 g/dL (ref 12.0–15.0)
MCHC: 34.7 g/dL (ref 30.0–36.0)
MCV: 88.1 fl (ref 78.0–100.0)
Platelets: 249 10*3/uL (ref 150.0–400.0)
RBC: 4.3 Mil/uL (ref 3.87–5.11)
RDW: 13.9 % (ref 11.5–15.5)
WBC: 8.1 10*3/uL (ref 4.0–10.5)

## 2018-06-01 LAB — FERRITIN: Ferritin: 55.3 ng/mL (ref 10.0–291.0)

## 2018-06-01 LAB — MAGNESIUM: MAGNESIUM: 2 mg/dL (ref 1.5–2.5)

## 2018-06-01 LAB — HEMOGLOBIN A1C: Hgb A1c MFr Bld: 5.5 % (ref 4.6–6.5)

## 2018-06-01 LAB — COMPREHENSIVE METABOLIC PANEL
ALT: 24 U/L (ref 0–35)
AST: 21 U/L (ref 0–37)
Albumin: 4.4 g/dL (ref 3.5–5.2)
Alkaline Phosphatase: 63 U/L (ref 39–117)
BUN: 8 mg/dL (ref 6–23)
CO2: 30 mEq/L (ref 19–32)
Calcium: 9.2 mg/dL (ref 8.4–10.5)
Chloride: 105 mEq/L (ref 96–112)
Creatinine, Ser: 0.92 mg/dL (ref 0.40–1.20)
GFR: 64.48 mL/min (ref >60.00)
Glucose, Bld: 80 mg/dL (ref 70–99)
Potassium: 3.6 mEq/L (ref 3.5–5.1)
Sodium: 141 mEq/L (ref 135–145)
Total Bilirubin: 0.4 mg/dL (ref 0.2–1.2)
Total Protein: 7.2 g/dL (ref 6.0–8.3)

## 2018-06-01 LAB — TSH: TSH: 2.28 u[IU]/mL (ref 0.35–4.50)

## 2018-06-01 LAB — IRON: Iron: 68 ug/dL (ref 42–145)

## 2018-06-01 MED ORDER — OMEPRAZOLE 20 MG PO CPDR: 20.0000 mg | DELAYED_RELEASE_CAPSULE | Freq: Every day | ORAL | 3 refills | Status: DC

## 2018-06-01 MED ORDER — CETIRIZINE HCL 10 MG PO TABS: 10.0000 mg | ORAL_TABLET | Freq: Every day | ORAL | 3 refills | Status: DC

## 2018-06-01 MED ORDER — LOSARTAN POTASSIUM 100 MG PO TABS: 100.0000 mg | ORAL_TABLET | Freq: Every day | ORAL | 3 refills | Status: DC

## 2018-06-01 NOTE — Patient Instructions (Signed)
CPAP compliance 83% Continue same settings Follow-up yearly

## 2018-06-01 NOTE — Assessment & Plan Note (Signed)
Update a1c Continue healthy diet, exercise F/U with further recommendations pending lab results - Comprehensive metabolic panel; Future - CBC; Future - Hemoglobin A1c; Future

## 2018-06-01 NOTE — Assessment & Plan Note (Signed)
Reviewed annual screening exams, healthy lifestyle, weight loss, additional information provided on AVS Update annual labs Lipid panel up to date - Comprehensive metabolic panel; Future - CBC; Future - Hemoglobin A1c; Future - Magnesium; Future - TSH; Future - Iron; Future - Ferritin; Future

## 2018-06-01 NOTE — Assessment & Plan Note (Signed)
Stable Continue losartan Continue healthy diet, exercise Update routine labs - losartan (COZAAR) 100 MG tablet; Take 1 tablet (100 mg total) by mouth daily.  Dispense: 90 tablet; Refill: 3 - Comprehensive metabolic panel; Future - CBC; Future

## 2018-06-01 NOTE — Assessment & Plan Note (Addendum)
12/22/17 lipid panel by cardiology, normal, LDL 79 Continue crestor Continue healthy diet, exercise - Comprehensive metabolic panel; Future - CBC; Future - Hemoglobin A1c; Future - TSH; Future

## 2018-06-01 NOTE — Assessment & Plan Note (Addendum)
Stable Continue prilosec F/u for new, recurrent symptoms - omeprazole (PRILOSEC) 20 MG capsule; Take 1 capsule (20 mg total) by mouth daily.  Dispense: 90 capsule; Refill: 3 - Magnesium; Future

## 2018-06-01 NOTE — Patient Instructions (Signed)
Head downstairs for labs  Health Maintenance, Female Adopting a healthy lifestyle and getting preventive care can go a long way to promote health and wellness. Talk with your health care provider about what schedule of regular examinations is right for you. This is a good chance for you to check in with your provider about disease prevention and staying healthy. In between checkups, there are plenty of things you can do on your own. Experts have done a lot of research about which lifestyle changes and preventive measures are most likely to keep you healthy. Ask your health care provider for more information. Weight and diet Eat a healthy diet  Be sure to include plenty of vegetables, fruits, low-fat dairy products, and lean protein.  Do not eat a lot of foods high in solid fats, added sugars, or salt.  Get regular exercise. This is one of the most important things you can do for your health. ? Most adults should exercise for at least 150 minutes each week. The exercise should increase your heart rate and make you sweat (moderate-intensity exercise). ? Most adults should also do strengthening exercises at least twice a week. This is in addition to the moderate-intensity exercise. Maintain a healthy weight  Body mass index (BMI) is a measurement that can be used to identify possible weight problems. It estimates body fat based on height and weight. Your health care provider can help determine your BMI and help you achieve or maintain a healthy weight.  For females 20 years of age and older: ? A BMI below 18.5 is considered underweight. ? A BMI of 18.5 to 24.9 is normal. ? A BMI of 25 to 29.9 is considered overweight. ? A BMI of 30 and above is considered obese. Watch levels of cholesterol and blood lipids  You should start having your blood tested for lipids and cholesterol at 51 years of age, then have this test every 5 years.  You may need to have your cholesterol levels checked more  often if: ? Your lipid or cholesterol levels are high. ? You are older than 50 years of age. ? You are at high risk for heart disease. Cancer screening Lung Cancer  Lung cancer screening is recommended for adults 55-80 years old who are at high risk for lung cancer because of a history of smoking.  A yearly low-dose CT scan of the lungs is recommended for people who: ? Currently smoke. ? Have quit within the past 15 years. ? Have at least a 30-pack-year history of smoking. A pack year is smoking an average of one pack of cigarettes a day for 1 year.  Yearly screening should continue until it has been 15 years since you quit.  Yearly screening should stop if you develop a health problem that would prevent you from having lung cancer treatment. Breast Cancer  Practice breast self-awareness. This means understanding how your breasts normally appear and feel.  It also means doing regular breast self-exams. Let your health care provider know about any changes, no matter how small.  If you are in your 20s or 30s, you should have a clinical breast exam (CBE) by a health care provider every 1-3 years as part of a regular health exam.  If you are 40 or older, have a CBE every year. Also consider having a breast X-ray (mammogram) every year.  If you have a family history of breast cancer, talk to your health care provider about genetic screening.  If you are at high   risk for breast cancer, talk to your health care provider about having an MRI and a mammogram every year.  Breast cancer gene (BRCA) assessment is recommended for women who have family members with BRCA-related cancers. BRCA-related cancers include: ? Breast. ? Ovarian. ? Tubal. ? Peritoneal cancers.  Results of the assessment will determine the need for genetic counseling and BRCA1 and BRCA2 testing. Cervical Cancer Your health care provider may recommend that you be screened regularly for cancer of the pelvic organs  (ovaries, uterus, and vagina). This screening involves a pelvic examination, including checking for microscopic changes to the surface of your cervix (Pap test). You may be encouraged to have this screening done every 3 years, beginning at age 55.  For women ages 12-65, health care providers may recommend pelvic exams and Pap testing every 3 years, or they may recommend the Pap and pelvic exam, combined with testing for human papilloma virus (HPV), every 5 years. Some types of HPV increase your risk of cervical cancer. Testing for HPV may also be done on women of any age with unclear Pap test results.  Other health care providers may not recommend any screening for nonpregnant women who are considered low risk for pelvic cancer and who do not have symptoms. Ask your health care provider if a screening pelvic exam is right for you.  If you have had past treatment for cervical cancer or a condition that could lead to cancer, you need Pap tests and screening for cancer for at least 20 years after your treatment. If Pap tests have been discontinued, your risk factors (such as having a new sexual partner) need to be reassessed to determine if screening should resume. Some women have medical problems that increase the chance of getting cervical cancer. In these cases, your health care provider may recommend more frequent screening and Pap tests. Colorectal Cancer  This type of cancer can be detected and often prevented.  Routine colorectal cancer screening usually begins at 51 years of age and continues through 51 years of age.  Your health care provider may recommend screening at an earlier age if you have risk factors for colon cancer.  Your health care provider may also recommend using home test kits to check for hidden blood in the stool.  A small camera at the end of a tube can be used to examine your colon directly (sigmoidoscopy or colonoscopy). This is done to check for the earliest forms of  colorectal cancer.  Routine screening usually begins at age 91.  Direct examination of the colon should be repeated every 5-10 years through 51 years of age. However, you may need to be screened more often if early forms of precancerous polyps or small growths are found. Skin Cancer  Check your skin from head to toe regularly.  Tell your health care provider about any new moles or changes in moles, especially if there is a change in a mole's shape or color.  Also tell your health care provider if you have a mole that is larger than the size of a pencil eraser.  Always use sunscreen. Apply sunscreen liberally and repeatedly throughout the day.  Protect yourself by wearing long sleeves, pants, a wide-brimmed hat, and sunglasses whenever you are outside. Heart disease, diabetes, and high blood pressure  High blood pressure causes heart disease and increases the risk of stroke. High blood pressure is more likely to develop in: ? People who have blood pressure in the high end of the normal range (  130-139/85-89 mm Hg). ? People who are overweight or obese. ? People who are African American.  If you are 71-22 years of age, have your blood pressure checked every 3-5 years. If you are 31 years of age or older, have your blood pressure checked every year. You should have your blood pressure measured twice-once when you are at a hospital or clinic, and once when you are not at a hospital or clinic. Record the average of the two measurements. To check your blood pressure when you are not at a hospital or clinic, you can use: ? An automated blood pressure machine at a pharmacy. ? A home blood pressure monitor.  If you are between 44 years and 23 years old, ask your health care provider if you should take aspirin to prevent strokes.  Have regular diabetes screenings. This involves taking a blood sample to check your fasting blood sugar level. ? If you are at a normal weight and have a low risk for  diabetes, have this test once every three years after 51 years of age. ? If you are overweight and have a high risk for diabetes, consider being tested at a younger age or more often. Preventing infection Hepatitis B  If you have a higher risk for hepatitis B, you should be screened for this virus. You are considered at high risk for hepatitis B if: ? You were born in a country where hepatitis B is common. Ask your health care provider which countries are considered high risk. ? Your parents were born in a high-risk country, and you have not been immunized against hepatitis B (hepatitis B vaccine). ? You have HIV or AIDS. ? You use needles to inject street drugs. ? You live with someone who has hepatitis B. ? You have had sex with someone who has hepatitis B. ? You get hemodialysis treatment. ? You take certain medicines for conditions, including cancer, organ transplantation, and autoimmune conditions. Hepatitis C  Blood testing is recommended for: ? Everyone born from [birth year] through 1963. ? Anyone with known risk factors for hepatitis C. Sexually transmitted infections (STIs)  You should be screened for sexually transmitted infections (STIs) including gonorrhea and chlamydia if: ? You are sexually active and are younger than 51 years of age. ? You are older than 51 years of age and your health care provider tells you that you are at risk for this type of infection. ? Your sexual activity has changed since you were last screened and you are at an increased risk for chlamydia or gonorrhea. Ask your health care provider if you are at risk.  If you do not have HIV, but are at risk, it may be recommended that you take a prescription medicine daily to prevent HIV infection. This is called pre-exposure prophylaxis (PrEP). You are considered at risk if: ? You are sexually active and do not regularly use condoms or know the HIV status of your partner(s). ? You take drugs by injection. ? You are  sexually active with a partner who has HIV. Talk with your health care provider about whether you are at high risk of being infected with HIV. If you choose to begin PrEP, you should first be tested for HIV. You should then be tested every 3 months for as long as you are taking PrEP. Pregnancy  If you are premenopausal and you may become pregnant, ask your health care provider about preconception counseling.  If you may become pregnant, take 400 to 800 micrograms (  mcg) of folic acid every day.  If you want to prevent pregnancy, talk to your health care provider about birth control (contraception). Osteoporosis and menopause  Osteoporosis is a disease in which the bones lose minerals and strength with aging. This can result in serious bone fractures. Your risk for osteoporosis can be identified using a bone density scan.  If you are 60 years of age or older, or if you are at risk for osteoporosis and fractures, ask your health care provider if you should be screened.  Ask your health care provider whether you should take a calcium or vitamin D supplement to lower your risk for osteoporosis.  Menopause may have certain physical symptoms and risks.  Hormone replacement therapy may reduce some of these symptoms and risks. Talk to your health care provider about whether hormone replacement therapy is right for you. Follow these instructions at home:  Schedule regular health, dental, and eye exams.  Stay current with your immunizations.  Do not use any tobacco products including cigarettes, chewing tobacco, or electronic cigarettes.  If you are pregnant, do not drink alcohol.  If you are breastfeeding, limit how much and how often you drink alcohol.  Limit alcohol intake to no more than 1 drink per day for nonpregnant women. One drink equals 12 ounces of beer, 5 ounces of wine, or 1 ounces of hard liquor.  Do not use street drugs.  Do not share needles.  Ask your health care  provider for help if you need support or information about quitting drugs.  Tell your health care provider if you often feel depressed.  Tell your health care provider if you have ever been abused or do not feel safe at home. This information is not intended to replace advice given to you by your health care provider. Make sure you discuss any questions you have with your health care provider. Document Released: 09/28/2010 Document Revised: 08/21/2015 Document Reviewed: 12/17/2014 Elsevier Interactive Patient Education  2019 [employer].

## 2018-06-01 NOTE — Progress Notes (Signed)
Amanda [name] is a 53 y.o. female with the following history as recorded in [employer]:  Patient Active Problem List   Diagnosis Date Noted  . Obstructive sleep apnea treated with continuous positive airway pressure (CPAP) 06/01/2018  . Hyperlipidemia with target LDL less than 100 01/06/2018  . Stress incontinence of urine 06/26/2017  . Encounter for general adult medical examination with abnormal findings 05/20/2017  . Non-seasonal allergic rhinitis 04/14/2017  . Moderate obstructive sleep apnea 05/20/2016  . Recurrent sinus infections 06/12/2014  . Chest pain 01/03/2014  . GERD (gastroesophageal reflux disease) 03/08/2012  . Benign essential hypertension 03/08/2012    Current Outpatient Medications  Medication Sig Dispense Refill  . cetirizine (EQ ALLERGY RELIEF, CETIRIZINE,) 10 MG tablet Take 1 tablet (10 mg total) by mouth daily. 90 tablet 3  . losartan (COZAAR) 100 MG tablet Take 1 tablet (100 mg total) by mouth daily. 90 tablet 3  . omeprazole (PRILOSEC) 20 MG capsule Take 1 capsule (20 mg total) by mouth daily. 90 capsule 3  . rosuvastatin (CRESTOR) 10 MG tablet Take 1 tablet (10 mg total) by mouth daily. 90 tablet 3   No current facility-administered medications for this visit.     Allergies: Patient has no known allergies.  Past Medical History:  Diagnosis Date  . Allergy   . Diverticulitis   . GERD (gastroesophageal reflux disease) 1993  . Hypertension 1993  . Sleep apnea   . Vitamin D deficiency 2011    Past Surgical History:  Procedure Laterality Date  . ABDOMINAL HYSTERECTOMY    . CHOLECYSTECTOMY    . CHOLECYSTECTOMY, LAPAROSCOPIC    . TUBAL LIGATION      Family History  Problem Relation Age of Onset  . Hypertension Mother   . Lupus Mother   . Hypertension Father   . Diabetes Father   . Heart disease Father   . Prostate cancer Father   . Asthma Brother   . Asthma Sister   . Cancer Sister        breast cancer   . Breast cancer Sister 54  .  Diabetes Sister   . Diabetes Brother   . Hypertension Sister   . Hypertension Brother     Social History   Tobacco Use  . Smoking status: Never Smoker  . Smokeless tobacco: Never Used  Substance Use Topics  . Alcohol use: No     Subjective:  Here today for CPE, med refills.  Last dental exam: no routine dental exam, planning to go this year Last vision exam: 2019 PAP: not indicated. No LMP recorded. Patient has had a hysterectomy. Mammogram- up to date Colonoscopy: up to date Lipids: lipid panel up to date DM screening- hx pre-diabetes, a1c ordered Vaccinations: up to date Diet and exercise: cutting sugary drinks, going to the gym every afternoon, or on days off  GERD-maintained on prilosec 20, taking daily as prescribed without noted adverse effects   Hypertension -maintained on losartan 100 daily Reports daily medication compliance without noted adverse medication effects. BP Readings from Last 3 Encounters:  06/01/18 138/80  06/01/18 125/76  04/06/18 132/80   HLD- maintained on crestor 10 Reports daily medication compliance without noted adverse medication effects.  Lab Results  Component Value Date   CHOL 146 12/22/2017   HDL 49 12/22/2017   LDLCALC 79 12/22/2017   TRIG 92 12/22/2017   CHOLHDL 3.0 12/22/2017   Review of Systems  Constitutional: Negative for chills and fever.  HENT: Negative for hearing loss.  Eyes: Negative for blurred vision and double vision.  Respiratory: Negative for cough and shortness of breath.   Cardiovascular: Negative for chest pain and palpitations.  Gastrointestinal: Negative for abdominal pain, constipation, diarrhea, heartburn, nausea and vomiting.  Genitourinary: Negative for dysuria and hematuria.  Musculoskeletal: Negative for falls.  Skin: Negative for rash.  Neurological: Negative for dizziness, speech change, loss of consciousness, weakness and headaches.  Endo/Heme/Allergies: Does not bruise/bleed easily.   Psychiatric/Behavioral: Negative for depression. The patient is not nervous/anxious.    She c/o hair loss, over past 2 years, not in patches or clumps, says "hair just seems to be thinning," seems to lose large amounts when washing hair.  Objective:  Vitals:   06/01/18 1351  BP: 138/80  Pulse: 70  SpO2: 97%  Weight: 234 lb (106.1 kg)  Height: 5\' 2"  (1.575 m)  Body mass index is 42.8 kg/m.   General: Well developed, well nourished, in no acute distress  Skin : Warm and dry. No rash, erythema. Head: Normocephalic and atraumatic  Eyes: Sclera and conjunctiva clear; pupils round and reactive to light; extraocular movements intact  Ears: External normal; canals clear; tympanic membranes normal  Oropharynx: Pink, supple. No suspicious lesions  Neck: Supple without thyromegaly, adenopathy  Lungs: Respirations unlabored; clear to auscultation bilaterally without wheeze, rales, rhonchi  CVS exam: normal rate, regular rhythm, normal S1, S2, no murmurs, rubs, clicks or gallops.  Abdomen: Soft; nontender; rotund; no masses or hepatosplenomegaly  Musculoskeletal: No deformities; no active joint inflammation  Extremities: No edema, cyanosis, clubbing  Vessels: Symmetric bilaterally  Neurologic: Alert and oriented; speech intact; face symmetrical; moves all extremities well; CNII-XII intact without focal deficit  Psychiatric: Normal mood and affect.  Assessment:  1. Encounter for general adult medical examination with abnormal findings   2. Benign essential hypertension   3. Gastroesophageal reflux disease, esophagitis presence not specified   4. Hyperlipidemia with target LDL less than 100   5. Pre-diabetes   6. Hair loss     Plan:   Hair loss Check labs F/U with further recommendations pending lab results - CBC; Future - TSH; Future - Iron; Future - Ferritin; Future - ANA; Future  Return in about 1 year (around 06/01/2019) for CPE.  Orders Placed This Encounter  Procedures  .  Comprehensive metabolic panel    Standing Status:   Future    Standing Expiration Date:   06/01/2019  . CBC    Standing Status:   Future    Standing Expiration Date:   06/01/2019  . Hemoglobin A1c    Standing Status:   Future    Standing Expiration Date:   06/01/2019  . Magnesium    Standing Status:   Future    Standing Expiration Date:   08/01/2018  . TSH    Standing Status:   Future    Standing Expiration Date:   06/01/2019  . Iron    Standing Status:   Future    Standing Expiration Date:   06/01/2019  . Ferritin    Standing Status:   Future    Standing Expiration Date:   06/01/2019  . ANA    Standing Status:   Future    Standing Expiration Date:   06/01/2019    Requested Prescriptions   Signed Prescriptions Disp Refills  . cetirizine (EQ ALLERGY RELIEF, CETIRIZINE,) 10 MG tablet 90 tablet 3    Sig: Take 1 tablet (10 mg total) by mouth daily.  Marland Kitchen losartan (COZAAR) 100 MG tablet 90 tablet  3    Sig: Take 1 tablet (100 mg total) by mouth daily.  Marland Kitchen omeprazole (PRILOSEC) 20 MG capsule 90 capsule 3    Sig: Take 1 capsule (20 mg total) by mouth daily.

## 2018-06-02 ENCOUNTER — Encounter: Payer: PRIVATE HEALTH INSURANCE | Admitting: Nurse Practitioner

## 2018-06-04 LAB — ANA: ANA: POSITIVE — AB

## 2018-06-04 LAB — ANTI-NUCLEAR AB-TITER (ANA TITER): ANA Titer 1: 1:40 {titer} — ABNORMAL HIGH

## 2018-06-08 ENCOUNTER — Other Ambulatory Visit: Payer: Self-pay | Admitting: Nurse Practitioner

## 2018-06-08 DIAGNOSIS — R768 Other specified abnormal immunological findings in serum: Secondary | ICD-10-CM

## 2018-06-08 DIAGNOSIS — L659 Nonscarring hair loss, unspecified: Secondary | ICD-10-CM

## 2018-06-28 DIAGNOSIS — L659 Nonscarring hair loss, unspecified: Secondary | ICD-10-CM | POA: Diagnosis not present

## 2018-06-28 DIAGNOSIS — M25561 Pain in right knee: Secondary | ICD-10-CM | POA: Diagnosis not present

## 2018-06-28 DIAGNOSIS — M25562 Pain in left knee: Secondary | ICD-10-CM | POA: Diagnosis not present

## 2018-06-28 DIAGNOSIS — M79641 Pain in right hand: Secondary | ICD-10-CM | POA: Diagnosis not present

## 2018-06-28 DIAGNOSIS — M79642 Pain in left hand: Secondary | ICD-10-CM | POA: Diagnosis not present

## 2018-06-28 DIAGNOSIS — M359 Systemic involvement of connective tissue, unspecified: Secondary | ICD-10-CM | POA: Diagnosis not present

## 2018-06-28 DIAGNOSIS — D8989 Other specified disorders involving the immune mechanism, not elsewhere classified: Secondary | ICD-10-CM | POA: Diagnosis not present

## 2018-06-28 DIAGNOSIS — R768 Other specified abnormal immunological findings in serum: Secondary | ICD-10-CM | POA: Diagnosis not present

## 2018-06-28 DIAGNOSIS — M25569 Pain in unspecified knee: Secondary | ICD-10-CM | POA: Diagnosis not present

## 2018-07-18 DIAGNOSIS — R768 Other specified abnormal immunological findings in serum: Secondary | ICD-10-CM | POA: Diagnosis not present

## 2018-07-18 DIAGNOSIS — M25569 Pain in unspecified knee: Secondary | ICD-10-CM | POA: Diagnosis not present

## 2018-07-18 DIAGNOSIS — G4733 Obstructive sleep apnea (adult) (pediatric): Secondary | ICD-10-CM | POA: Diagnosis not present

## 2018-07-18 DIAGNOSIS — M79643 Pain in unspecified hand: Secondary | ICD-10-CM | POA: Diagnosis not present

## 2018-07-18 DIAGNOSIS — L659 Nonscarring hair loss, unspecified: Secondary | ICD-10-CM | POA: Diagnosis not present

## 2018-09-20 ENCOUNTER — Other Ambulatory Visit: Payer: Self-pay | Admitting: Cardiovascular Disease

## 2018-09-20 MED ORDER — ROSUVASTATIN CALCIUM 10 MG PO TABS: 10.0000 mg | ORAL_TABLET | Freq: Every day | ORAL | 1 refills | Status: DC

## 2018-09-25 DIAGNOSIS — H524 Presbyopia: Secondary | ICD-10-CM | POA: Diagnosis not present

## 2018-09-25 DIAGNOSIS — H353131 Nonexudative age-related macular degeneration, bilateral, early dry stage: Secondary | ICD-10-CM | POA: Diagnosis not present

## 2018-09-25 DIAGNOSIS — H2513 Age-related nuclear cataract, bilateral: Secondary | ICD-10-CM | POA: Diagnosis not present

## 2018-10-02 ENCOUNTER — Telehealth: Payer: Self-pay | Admitting: Nurse Practitioner

## 2018-10-02 DIAGNOSIS — K219 Gastro-esophageal reflux disease without esophagitis: Secondary | ICD-10-CM

## 2018-10-02 DIAGNOSIS — I10 Essential (primary) hypertension: Secondary | ICD-10-CM

## 2018-10-02 MED ORDER — CETIRIZINE HCL 10 MG PO TABS: 10.0000 mg | ORAL_TABLET | Freq: Every day | ORAL | 0 refills | Status: DC

## 2018-10-02 MED ORDER — LOSARTAN POTASSIUM 100 MG PO TABS: 100.0000 mg | ORAL_TABLET | Freq: Every day | ORAL | 0 refills | Status: DC

## 2018-10-02 MED ORDER — OMEPRAZOLE 20 MG PO CPDR: 20.0000 mg | DELAYED_RELEASE_CAPSULE | Freq: Every day | ORAL | 0 refills | Status: DC

## 2018-10-02 NOTE — Telephone Encounter (Signed)
Amanda [name] with [facility] requests 90 day supply with up to 3 refills for the following: Medication: cetirizine (EQ ALLERGY RELIEF, CETIRIZINE,) 10 MG tablet, losartan (COZAAR) 100 MG tablet, and omeprazole (PRILOSEC) 20 MG capsule  Has the patient contacted their pharmacy? yes   Preferred Pharmacy (with phone number or street name):  [place], [address] [place] [phone] (Phone) [phone] (Fax)   Agent: Please be advised that RX refills may take up to 3 business days. We ask that you follow-up with your pharmacy.

## 2018-10-02 NOTE — Telephone Encounter (Signed)
Refills sent. See meds.  

## 2018-11-29 ENCOUNTER — Ambulatory Visit (INDEPENDENT_AMBULATORY_CARE_PROVIDER_SITE_OTHER): Payer: BC Managed Care – PPO | Admitting: Family

## 2018-11-29 ENCOUNTER — Other Ambulatory Visit: Payer: Self-pay

## 2018-11-29 ENCOUNTER — Encounter: Payer: Self-pay | Admitting: Family

## 2018-11-29 VITALS — BP 128/88 | HR 80 | Temp 98.3°F | Ht 62.0 in | Wt 244.6 lb

## 2018-11-29 DIAGNOSIS — K219 Gastro-esophageal reflux disease without esophagitis: Secondary | ICD-10-CM

## 2018-11-29 DIAGNOSIS — J3089 Other allergic rhinitis: Secondary | ICD-10-CM

## 2018-11-29 DIAGNOSIS — I10 Essential (primary) hypertension: Secondary | ICD-10-CM

## 2018-11-29 DIAGNOSIS — E785 Hyperlipidemia, unspecified: Secondary | ICD-10-CM

## 2018-11-29 DIAGNOSIS — G4733 Obstructive sleep apnea (adult) (pediatric): Secondary | ICD-10-CM

## 2018-11-29 MED ORDER — LOSARTAN POTASSIUM 100 MG PO TABS: 100.0000 mg | ORAL_TABLET | Freq: Every day | ORAL | 2 refills | Status: DC

## 2018-11-29 MED ORDER — ROSUVASTATIN CALCIUM 10 MG PO TABS: 10.0000 mg | ORAL_TABLET | Freq: Every day | ORAL | 2 refills | Status: DC

## 2018-11-29 MED ORDER — CETIRIZINE HCL 10 MG PO TABS: 10.0000 mg | ORAL_TABLET | Freq: Every day | ORAL | 2 refills | Status: DC

## 2018-11-29 MED ORDER — OMEPRAZOLE 20 MG PO CPDR: 20.0000 mg | DELAYED_RELEASE_CAPSULE | Freq: Every day | ORAL | 2 refills | Status: DC

## 2018-11-29 NOTE — Progress Notes (Signed)
Amanda [name] is a 53 y.o. female with the following history as recorded in Epic[facility]:  Patient Active Problem List   Diagnosis Date Noted  . Obstructive sleep apnea treated with continuous positive airway pressure (CPAP) 06/01/2018  . Pre-diabetes 06/01/2018  . Hyperlipidemia with target LDL less than 100 01/06/2018  . Stress incontinence of urine 06/26/2017  . Encounter for general adult medical examination with abnormal findings 05/20/2017  . Non-seasonal allergic rhinitis 04/14/2017  . Moderate obstructive sleep apnea 05/20/2016  . Recurrent sinus infections 06/12/2014  . Chest pain 01/03/2014  . GERD (gastroesophageal reflux disease) 03/08/2012  . Benign essential hypertension 03/08/2012    Current Outpatient Medications  Medication Sig Dispense Refill  . cetirizine (EQ ALLERGY RELIEF, CETIRIZINE,) 10 MG tablet Take 1 tablet (10 mg total) by mouth daily. 90 tablet 2  . losartan (COZAAR) 100 MG tablet Take 1 tablet (100 mg total) by mouth daily. 90 tablet 2  . Multiple Vitamins-Minerals (PRESERVISION AREDS 2) CAPS Take by mouth.    Marland Kitchen omeprazole (PRILOSEC) 20 MG capsule Take 1 capsule (20 mg total) by mouth daily. 90 capsule 2  . rosuvastatin (CRESTOR) 10 MG tablet Take 1 tablet (10 mg total) by mouth daily. 90 tablet 2   No current facility-administered medications for this visit.     Allergies: Patient has no known allergies.  Past Medical History:  Diagnosis Date  . Allergy   . Diverticulitis   . GERD (gastroesophageal reflux disease) 1993  . Hypertension 1993  . Sleep apnea   . Vitamin D deficiency 2011    Past Surgical History:  Procedure Laterality Date  . ABDOMINAL HYSTERECTOMY    . CHOLECYSTECTOMY    . CHOLECYSTECTOMY, LAPAROSCOPIC    . TUBAL LIGATION      Family History  Problem Relation Age of Onset  . Hypertension Mother   . Lupus Mother   . Hypertension Father   . Diabetes Father   . Heart disease Father   . Prostate cancer Father   . Asthma  Brother   . Asthma Sister   . Cancer Sister        breast cancer   . Breast cancer Sister 52  . Diabetes Sister   . Diabetes Brother   . Hypertension Sister   . Hypertension Brother     Social History   Tobacco Use  . Smoking status: Never Smoker  . Smokeless tobacco: Never Used  Substance Use Topics  . Alcohol use: No    Subjective:  Follow up on chronic care needs including: 1) Hypertension- Denies any chest pain, shortness of breath, blurred vision or headache 2) Allergic rhinitis- stable on Zyrtec 10 mg; 3) GERD- stable on Omeprazole; 4) Hyperlipidemia- stable on Crestor 10 mg; no concerns for myalgias;  Is excited to get back into the gym- feels her weight has gone up due to being unable to exercise regularly;      Objective:  Vitals:   11/29/18 1207  BP: 128/88  Pulse: 80  Temp: 98.3 F (36.8 C)  TempSrc: Oral  SpO2: 98%  Weight: 244 lb 9.6 oz (110.9 kg)  Height: 5\' 2"  (1.575 m)    General: Well developed, well nourished, in no acute distress  Skin : Warm and dry.  Head: Normocephalic and atraumatic  Eyes: Sclera and conjunctiva clear; pupils round and reactive to light; extraocular movements intact  Lungs: Respirations unlabored; clear to auscultation bilaterally without wheeze, rales, rhonchi  CVS exam: normal rate and regular rhythm.  Neurologic:  Alert and oriented; speech intact; face symmetrical; moves all extremities well; CNII-XII intact without focal deficit   Assessment:  1. Hyperlipidemia with target LDL less than 100   2. Benign essential hypertension   3. Gastroesophageal reflux disease, esophagitis presence not specified   4. Non-seasonal allergic rhinitis, unspecified trigger   5. Moderate obstructive sleep apnea     Plan:  Stable chronic conditions; refills updated; will plan to check labs at next OV; keep planned follow up with neurology for sleep apnea in March;  Encouraged to start exercising- work on weight loss goals;   Return in  about 6 months (around 05/29/2019) for CPE.  No orders of the defined types were placed in this encounter.   Requested Prescriptions   Signed Prescriptions Disp Refills  . cetirizine (EQ ALLERGY RELIEF, CETIRIZINE,) 10 MG tablet 90 tablet 2    Sig: Take 1 tablet (10 mg total) by mouth daily.  . rosuvastatin (CRESTOR) 10 MG tablet 90 tablet 2    Sig: Take 1 tablet (10 mg total) by mouth daily.  Marland Kitchen. losartan (COZAAR) 100 MG tablet 90 tablet 2    Sig: Take 1 tablet (100 mg total) by mouth daily.  Marland Kitchen. omeprazole (PRILOSEC) 20 MG capsule 90 capsule 2    Sig: Take 1 capsule (20 mg total) by mouth daily.

## 2019-01-30 DIAGNOSIS — G4733 Obstructive sleep apnea (adult) (pediatric): Secondary | ICD-10-CM | POA: Diagnosis not present

## 2019-02-15 DIAGNOSIS — J349 Unspecified disorder of nose and nasal sinuses: Secondary | ICD-10-CM | POA: Diagnosis not present

## 2019-02-15 DIAGNOSIS — Z20828 Contact with and (suspected) exposure to other viral communicable diseases: Secondary | ICD-10-CM | POA: Diagnosis not present

## 2019-03-12 ENCOUNTER — Other Ambulatory Visit: Payer: Self-pay | Admitting: Family

## 2019-03-12 DIAGNOSIS — Z1231 Encounter for screening mammogram for malignant neoplasm of breast: Secondary | ICD-10-CM

## 2019-05-02 ENCOUNTER — Other Ambulatory Visit: Payer: Self-pay

## 2019-05-02 ENCOUNTER — Ambulatory Visit
Admission: RE | Admit: 2019-05-02 | Discharge: 2019-05-02 | Disposition: A | Discharge status: Home / Self Care | Payer: BC Managed Care – PPO | Source: Ambulatory Visit | Attending: Family | Admitting: Family

## 2019-05-02 DIAGNOSIS — Z1231 Encounter for screening mammogram for malignant neoplasm of breast: Secondary | ICD-10-CM | POA: Diagnosis not present

## 2019-05-11 ENCOUNTER — Encounter: Payer: Self-pay | Admitting: Cardiovascular Disease

## 2019-05-11 ENCOUNTER — Ambulatory Visit (INDEPENDENT_AMBULATORY_CARE_PROVIDER_SITE_OTHER): Payer: BC Managed Care – PPO | Admitting: Cardiovascular Disease

## 2019-05-11 ENCOUNTER — Other Ambulatory Visit: Payer: Self-pay

## 2019-05-11 VITALS — BP 142/92 | HR 79 | Ht 63.0 in | Wt 246.0 lb

## 2019-05-11 DIAGNOSIS — I10 Essential (primary) hypertension: Secondary | ICD-10-CM

## 2019-05-11 DIAGNOSIS — I2510 Atherosclerotic heart disease of native coronary artery without angina pectoris: Secondary | ICD-10-CM

## 2019-05-11 MED ORDER — CHLORTHALIDONE 25 MG PO TABS: 12.5000 mg | ORAL_TABLET | Freq: Every day | ORAL | 11 refills | Status: DC

## 2019-05-11 MED ORDER — POTASSIUM CHLORIDE ER 10 MEQ PO TBCR: 20.0000 meq | EXTENDED_RELEASE_TABLET | Freq: Every day | ORAL | 11 refills | Status: DC

## 2019-05-11 NOTE — Patient Instructions (Addendum)
Medication Instructions:  Your physician has recommended you make the following change in your medication:  START Chlorthalidone (Hygroton) 12.5 mg (1/2 tab) once daily  START Kdur (Potassium chloride) 20 mEq (2 10 mg tabs) once daily  *If you need a refill on your cardiac medications before your next appointment, please call your pharmacy*  Lab Work: Your physician recommends that you return for lab work in: 3 weeks for basic metabolic panel on Friday March 5 **You may come anytime between 7:30 am and 4:45 pm **You do not have to fast for this appointment  If you have labs (blood work) drawn today and your tests are completely normal, you will receive your results only by: Marland Kitchen MyChart Message (if you have MyChart) OR . A paper copy in the mail If you have any lab test that is abnormal or we need to change your treatment, we will call you to review the results.   Testing/Procedures: None Ordered   Follow-Up: At [facility], you and your health needs are our priority.  As part of our continuing mission to provide you with exceptional heart care, we have created designated Provider Care Teams.  These Care Teams include your primary Cardiologist (physician) and Advanced Practice Providers (APPs -  Physician Assistants and Nurse Practitioners) who all work together to provide you with the care you need, when you need it.  Your next appointment:   1 year(s)  The format for your next appointment:   In Person  Provider:   You may see Kristeen Miss, MD or one of the following Advanced Practice Providers on your designated Care Team:    Tereso Newcomer, PA-C  Vin [place], [place]  [place], [place]

## 2019-05-11 NOTE — Progress Notes (Addendum)
Cardiology Office Note:    Date:  05/11/2019   ID:  Amanda [name], DOB 08-Dec-1965, MRN [id number]  PCP:  Olive Bass, FNP  Cardiologist:   New to Amanda [name]   Referring MD: Olive Bass,*   Chief Complaint  Patient presents with  . Coronary Artery Disease       Amanda [name] is a 54 y.o. female with a hx of hypertension.  We are asked to see her today for further evaluation of chest discomfort by Dr.  Aura Camps   Has had palpitations for the past 5 years.  Associated with near syncope Has occasional CP.    Wakes up with chest pain .   Early morning hours or middle of the night .  Not exertional . Does not get any regular exercise.   Just bought a treadmill .   Has not started using it yet   Is eating more salt than usual.   earting more fast foods.   Having to take care of her mom.  Her father is now in a memory care unit.  Looking for assisted living care.     Sept. 26, , 2019 Amanda [name] presented to me several months ago with some atypical chest pain.  We performed a coronary CT angiogram.  She was found to have mild coronary artery disease involving the left anterior descending artery.  Her coronary calcium score was 1.  Feb. 12, 2021:  Amanda [name] is seen today for a follow-up visit.  She is had some chest pain in the past.  Coronary CT angiogram reveals mild coronary artery disease involving the left anterior descending artery.  Coronary calcium score was 1. BP is a little elevated.    Still eating salt .   Is not exercising any longer   Past Medical History:  Diagnosis Date  . Allergy   . Diverticulitis   . GERD (gastroesophageal reflux disease) 1993  . Hypertension 1993  . Sleep apnea   . Vitamin D deficiency 2011    Past Surgical History:  Procedure Laterality Date  . ABDOMINAL HYSTERECTOMY    . CHOLECYSTECTOMY    . CHOLECYSTECTOMY, LAPAROSCOPIC    . TUBAL LIGATION      Current Medications: Current Meds  Medication Sig  .  cetirizine (EQ ALLERGY RELIEF, CETIRIZINE,) 10 MG tablet Take 1 tablet (10 mg total) by mouth daily.  Marland Kitchen losartan (COZAAR) 100 MG tablet Take 1 tablet (100 mg total) by mouth daily.  . Multiple Vitamins-Minerals (PRESERVISION AREDS 2) CAPS Take by mouth.  Marland Kitchen omeprazole (PRILOSEC) 20 MG capsule Take 1 capsule (20 mg total) by mouth daily.  . rosuvastatin (CRESTOR) 10 MG tablet Take 1 tablet (10 mg total) by mouth daily.     Allergies:   Patient has no known allergies.   Social History   Socioeconomic History  . Marital status: Widowed    Spouse name: Not on file  . Number of children: 2  . Years of education: GED  . Highest education level: Not on file  Occupational History  . Occupation: Location manager  Tobacco Use  . Smoking status: Never Smoker  . Smokeless tobacco: Never Used  Substance and Sexual Activity  . Alcohol use: No  . Drug use: No  . Sexual activity: Not on file  Other Topics Concern  . Not on file  Social History Narrative   Lives in [place] with her sister. She works at [employer] living.    Drinks about 2 caffeine drinks  a week    Social Determinants of Health   Financial Resource Strain:   . Difficulty of Paying Living Expenses: Not on file  Food Insecurity:   . Worried About Programme researcher, broadcasting/film/video in the Last Year: Not on file  . Ran Out of Food in the Last Year: Not on file  Transportation Needs:   . Lack of Transportation (Medical): Not on file  . Lack of Transportation (Non-Medical): Not on file  Physical Activity:   . Days of Exercise per Week: Not on file  . Minutes of Exercise per Session: Not on file  Stress:   . Feeling of Stress : Not on file  Social Connections:   . Frequency of Communication with Friends and Family: Not on file  . Frequency of Social Gatherings with Friends and Family: Not on file  . Attends Religious Services: Not on file  . Active Member of Clubs or Organizations: Not on file  . Attends Banker  Meetings: Not on file  . Marital Status: Not on file     Family History: The patient's family history includes Asthma in her brother and sister; Breast cancer (age of onset: 42) in her sister; Cancer in her sister; Diabetes in her brother, father, and sister; Heart disease in her father; Hypertension in her brother, father, mother, and sister; Lupus in her mother; Prostate cancer in her father.  ROS:   Please see the history of present illness.     All other systems reviewed and are negative.  EKGs/Labs/Other Studies Reviewed:    The following studies were reviewed today:     Recent Labs: 06/01/2018: ALT 24; BUN 8; Creatinine, Ser 0.92; Hemoglobin 13.1; Magnesium 2.0; Platelets 249.0; Potassium 3.6; Sodium 141; TSH 2.28  Recent Lipid Panel    Component Value Date/Time   CHOL 146 12/22/2017 0934   TRIG 92 12/22/2017 0934   HDL 49 12/22/2017 0934   CHOLHDL 3.0 12/22/2017 0934   CHOLHDL 5 05/20/2017 1209   VLDL 16.6 05/20/2017 1209   LDLCALC 79 12/22/2017 0934    Physical Exam: Blood pressure (!) 142/92, pulse 79, height 5\' 3"  (1.6 m), weight 246 lb (111.6 kg), SpO2 98 %.  GEN:  Well nourished, well developed in no acute distress HEENT: Normal NECK: No JVD; No carotid bruits LYMPHATICS: No lymphadenopathy CARDIAC: RRR , no murmurs, rubs, gallops RESPIRATORY:  Clear to auscultation without rales, wheezing or rhonchi  ABDOMEN: Soft, non-tender, non-distended MUSCULOSKELETAL:  No edema; No deformity  SKIN: Warm and dry NEUROLOGIC:  Alert and oriented x 3  EKG: N May 11, 2019: Normal sinus rhythm at 79.  T wave inversions in leads V4 through V6.  No significant changes from previous ECGs.   ASSESSMENT:    1. Essential (primary) hypertension   2. Coronary artery disease involving native coronary artery of native heart without angina pectoris    PLAN:     1. Chest pain:     Has ramdom episodes of cp   2.  Hypertension:     BP is elevate..   Makes little effort to  eat right.  Does not exercise at all.    Will start on chlorthalidone 12.5 mg a day along with potassium chloride 20 mill equivalents a day.  We will check basic meta metabolic profile in 2 weeks.  Encouraged her to watch her salt intake and to exercise on a regular basis.  3.  Hyperlipidemia:   Managed by her primary medical doctor.   Medication  Adjustments/Labs and Tests Ordered: Current medicines are reviewed at length with the patient today.  Concerns regarding medicines are outlined above.  Orders Placed This Encounter  Procedures  . Basic Metabolic Panel (BMET)  . EKG 12-Lead   Meds ordered this encounter  Medications  . potassium chloride (KLOR-CON) 10 MEQ tablet    Sig: Take 2 tablets (20 mEq total) by mouth daily.    Dispense:  60 tablet    Refill:  11  . chlorthalidone (HYGROTON) 25 MG tablet    Sig: Take 0.5 tablets (12.5 mg total) by mouth daily.    Dispense:  15 tablet    Refill:  11      Patient Instructions  Medication Instructions:  Your physician has recommended you make the following change in your medication:  START Chlorthalidone (Hygroton) 12.5 mg (1/2 tab) once daily  START Kdur (Potassium chloride) 20 mEq (2 10 mg tabs) once daily  *If you need a refill on your cardiac medications before your next appointment, please call your pharmacy*  Lab Work: Your physician recommends that you return for lab work in: 3 weeks for basic metabolic panel on Friday March 5 **You may come anytime between 7:30 am and 4:45 pm **You do not have to fast for this appointment  If you have labs (blood work) drawn today and your tests are completely normal, you will receive your results only by: Marland Kitchen MyChart Message (if you have MyChart) OR . A paper copy in the mail If you have any lab test that is abnormal or we need to change your treatment, we will call you to review the results.   Testing/Procedures: None Ordered   Follow-Up: At [facility], you and your health  needs are our priority.  As part of our continuing mission to provide you with exceptional heart care, we have created designated Provider Care Teams.  These Care Teams include your primary Cardiologist (physician) and Advanced Practice Providers (APPs -  Physician Assistants and Nurse Practitioners) who all work together to provide you with the care you need, when you need it.  Your next appointment:   1 year(s)  The format for your next appointment:   In Person  Provider:   You may see Mertie Moores, MD or one of the following Advanced Practice Providers on your designated Care Team:    Richardson Dopp, PA-C  Vin [place], [place]  Daune Perch, [place]      Signed, Mertie Moores, MD  05/11/2019 10:34 AM    [place]

## 2019-05-22 DIAGNOSIS — G4733 Obstructive sleep apnea (adult) (pediatric): Secondary | ICD-10-CM | POA: Diagnosis not present

## 2019-06-01 ENCOUNTER — Other Ambulatory Visit: Payer: BC Managed Care – PPO | Admitting: *Deleted

## 2019-06-01 ENCOUNTER — Other Ambulatory Visit: Payer: Self-pay

## 2019-06-01 DIAGNOSIS — I2510 Atherosclerotic heart disease of native coronary artery without angina pectoris: Secondary | ICD-10-CM

## 2019-06-01 DIAGNOSIS — I10 Essential (primary) hypertension: Secondary | ICD-10-CM | POA: Diagnosis not present

## 2019-06-01 LAB — BASIC METABOLIC PANEL
BUN/Creatinine Ratio: 12 (ref 9–23)
BUN: 12 mg/dL (ref 6–24)
CO2: 26 mmol/L (ref 20–29)
Calcium: 9.5 mg/dL (ref 8.7–10.2)
Chloride: 99 mmol/L (ref 96–106)
Creatinine, Ser: 1.01 mg/dL — ABNORMAL HIGH (ref 0.57–1.00)
GFR calc Af Amer: 74 mL/min/{1.73_m2} (ref >59)
GFR calc non Af Amer: 65 mL/min/{1.73_m2} (ref >59)
Glucose: 116 mg/dL — ABNORMAL HIGH (ref 65–99)
Potassium: 3.6 mmol/L (ref 3.5–5.2)
Sodium: 141 mmol/L (ref 134–144)

## 2019-06-04 ENCOUNTER — Other Ambulatory Visit: Payer: Self-pay | Admitting: Nurse Practitioner

## 2019-06-04 MED ORDER — CHLORTHALIDONE 25 MG PO TABS: 12.5000 mg | ORAL_TABLET | Freq: Every day | ORAL | 3 refills | Status: DC

## 2019-06-04 MED ORDER — POTASSIUM CHLORIDE ER 10 MEQ PO TBCR: 20.0000 meq | EXTENDED_RELEASE_TABLET | Freq: Every day | ORAL | 3 refills | Status: DC

## 2019-06-06 ENCOUNTER — Ambulatory Visit: Payer: BLUE CROSS/BLUE SHIELD | Admitting: Family Medicine

## 2019-06-13 ENCOUNTER — Ambulatory Visit (INDEPENDENT_AMBULATORY_CARE_PROVIDER_SITE_OTHER): Payer: BC Managed Care – PPO | Admitting: Family Medicine

## 2019-06-13 ENCOUNTER — Encounter: Payer: Self-pay | Admitting: Family Medicine

## 2019-06-13 ENCOUNTER — Other Ambulatory Visit: Payer: Self-pay

## 2019-06-13 VITALS — BP 142/74 | HR 77 | Temp 97.6°F | Ht 62.7 in | Wt 242.0 lb

## 2019-06-13 DIAGNOSIS — L659 Nonscarring hair loss, unspecified: Secondary | ICD-10-CM

## 2019-06-13 DIAGNOSIS — G44219 Episodic tension-type headache, not intractable: Secondary | ICD-10-CM

## 2019-06-13 DIAGNOSIS — G4733 Obstructive sleep apnea (adult) (pediatric): Secondary | ICD-10-CM

## 2019-06-13 DIAGNOSIS — R5383 Other fatigue: Secondary | ICD-10-CM

## 2019-06-13 DIAGNOSIS — Z9989 Dependence on other enabling machines and devices: Secondary | ICD-10-CM

## 2019-06-13 NOTE — Progress Notes (Addendum)
PATIENT: Amanda [name] DOB: Aug 02, 1965  REASON FOR VISIT: follow up HISTORY FROM: patient  Chief Complaint  Patient presents with  . Follow-up    room 1, alone, pt states cpap is working fine, states some headaches, [facility]     HISTORY OF PRESENT ILLNESS: Today 06/13/19 Amanda [name] is a 54 y.o. female here today for follow up for OSA on CPAP therapy. Overall, she is doing well. She does endorse more fatigue and headaches recently. She wakes up at 3 days a week feeling exhausted. She reports having a mild headache about once weekly, frontal and throbbing. No migraine symptoms or vision changes. Headache resolves with Tylenol. BP has been slightly elevated. She was seen by cardiology and started on chlorthalidone 12.5mg  daily and potassium supplements. She has also noted significant hair loss that started about 2 years ago. She had labs with PCP that were normal. She was told it was related to stress. She had to place parents in nursing homes in the recent years. Her father died in in 2017-09-21.  She is working 12 hour shifts at work. She tries to drink 3 bottles of water daily.   Compliance report dated 05/13/2019 through 06/11/2019 reveals that she has used CPAP 30 of the last 30 days for compliance of 100%.  She used CPAP greater than 4 hours all 30 days for compliance 100%.  Average usage was 7 hours and 45 minutes.  Residual AHI was 1.5 on 12 cm of water and an EPR of 3.  There were no significant leaks noted.  HISTORY: (copied from Amanda [name]'s note on 06/01/2018)  Amanda [name] is a 54 year old right-handed woman with an underlying medical history of hypertension, allergies, reflux disease, vitamin D deficiency, and morbid obesity, who presents for follow-up consultation of her sleep disorder, after home sleep study testing and trial of AutoPap therapy. The patient is unaccompanied today. I last saw her on 08/10/2016, at which time she reported doing much better after  starting AutoPap therapy. She felt that her sleep quality and sleep consolidation as well as daytime energy were improved as well as her morning headaches. She was motivated to work on her weight loss and continue with AutoPap. I suggested we proceed with an overnight pulse oximetry test to monitor oxygen saturations for 1 night. She had a pulse ox on 08/24/2016 with a total duration of 5 hours and 56 minutes, average oxygen saturation of 97.3%, nadir of 92%.  Today, 02/10/2017: I reviewed her AutoPap compliance data from 01/10/2017 through 02/08/2017 which is a total of 30 days, during which time she used her machine every night with percent used days greater than 4 hours at 90%, indicating excellent compliance with an average usage of 7 hours and 34 minutes, residual AHI at goal at 1.6 per hour, pressure for the 95th percentile right at 12 cm, leak very low, pressure setting of 4-13 cm with EPR. She reportsdoing well, will start going to the gym. Had a bout of bronchitis, went to [facility] and had a Zpack. Could not use the machine then. Otherwise doing well. Would be okay trying CPAP of 12 cm, using nasal mask.  UPDATE 3/5/2020CM Amanda [name], 54 year old female returns for follow-up with history of obstructive sleep apnea here for CPAP compliance.  She is doing well with her CPAP.  She claims she has more sleepiness lately.  She was recently switched from an 8-hour shift to a 12-hour shift and she is still adjusting to this.  CPAP data dated 05/01/2018-05/30/2018 shows compliance greater than 4 hours at 83%.  Average usage 7 hours 19 minutes.  Set pressure 12 cm leak 95th percentile 0.9.  AHI 0.9.  The other 5 days she did not use the machine she had a cold.  She returns for reevaluation   REVIEW OF SYSTEMS: Out of a complete 14 system review of symptoms, the patient complains only of the following symptoms, headaches, fatigue and all other reviewed systems are negative.  ALLERGIES: No Known Allergies  HOME  MEDICATIONS: Outpatient Medications Prior to Visit  Medication Sig Dispense Refill  . cetirizine (EQ ALLERGY RELIEF, CETIRIZINE,) 10 MG tablet Take 1 tablet (10 mg total) by mouth daily. 90 tablet 2  . chlorthalidone (HYGROTON) 25 MG tablet Take 0.5 tablets (12.5 mg total) by mouth daily. 45 tablet 3  . losartan (COZAAR) 100 MG tablet Take 1 tablet (100 mg total) by mouth daily. 90 tablet 2  . Multiple Vitamins-Minerals (PRESERVISION AREDS 2) CAPS Take by mouth.    Marland Kitchen omeprazole (PRILOSEC) 20 MG capsule Take 1 capsule (20 mg total) by mouth daily. 90 capsule 2  . potassium chloride (KLOR-CON) 10 MEQ tablet Take 2 tablets (20 mEq total) by mouth daily. 90 tablet 3  . rosuvastatin (CRESTOR) 10 MG tablet Take 1 tablet (10 mg total) by mouth daily. 90 tablet 2   No facility-administered medications prior to visit.    PAST MEDICAL HISTORY: Past Medical History:  Diagnosis Date  . Allergy   . Diverticulitis   . GERD (gastroesophageal reflux disease) 1993  . Hypertension 1993  . Sleep apnea   . Vitamin D deficiency 2011    PAST SURGICAL HISTORY: Past Surgical History:  Procedure Laterality Date  . ABDOMINAL HYSTERECTOMY    . CHOLECYSTECTOMY    . CHOLECYSTECTOMY, LAPAROSCOPIC    . TUBAL LIGATION      FAMILY HISTORY: Family History  Problem Relation Age of Onset  . Hypertension Mother   . Lupus Mother   . Hypertension Father   . Diabetes Father   . Heart disease Father   . Prostate cancer Father   . Asthma Brother   . Asthma Sister   . Cancer Sister        breast cancer   . Breast cancer Sister 46  . Diabetes Sister   . Diabetes Brother   . Hypertension Sister   . Hypertension Brother     SOCIAL HISTORY: Social History   Socioeconomic History  . Marital status: Widowed    Spouse name: Not on file  . Number of children: 2  . Years of education: GED  . Highest education level: Not on file  Occupational History  . Occupation: Location manager  Tobacco Use  .  Smoking status: Never Smoker  . Smokeless tobacco: Never Used  Substance and Sexual Activity  . Alcohol use: No  . Drug use: No  . Sexual activity: Not on file  Other Topics Concern  . Not on file  Social History Narrative   Lives in [place] with her sister. She works at [employer] living.    Drinks about 2 caffeine drinks a week    Social Determinants of Corporate investment banker Strain:   . Difficulty of Paying Living Expenses:   Food Insecurity:   . Worried About Programme researcher, broadcasting/film/video in the Last Year:   . Barista in the Last Year:   Transportation Needs:   . Freight forwarder (Medical):   Marland Kitchen  Lack of Transportation (Non-Medical):   Physical Activity:   . Days of Exercise per Week:   . Minutes of Exercise per Session:   Stress:   . Feeling of Stress :   Social Connections:   . Frequency of Communication with Friends and Family:   . Frequency of Social Gatherings with Friends and Family:   . Attends Religious Services:   . Active Member of Clubs or Organizations:   . Attends Archivist Meetings:   Marland Kitchen Marital Status:   Intimate Partner Violence:   . Fear of Current or Ex-Partner:   . Emotionally Abused:   Marland Kitchen Physically Abused:   . Sexually Abused:       PHYSICAL EXAM  Vitals:   06/13/19 1354  BP: (!) 142/74  Pulse: 77  Temp: 97.6 F (36.4 C)  Weight: 242 lb (109.8 kg)  Height: 5' 2.7" (1.593 m)   Body mass index is 43.28 kg/m.  Generalized: Well developed, in no acute distress  Cardiology: normal rate and rhythm, no murmur noted Respiratory: clear to auscultation bilaterally  Respiratory: Clear to auscultation bilaterally Neurological examination  Mentation: Alert oriented to time, place, history taking. Follows all commands speech and language fluent Cranial nerve II-XII: Pupils were equal round reactive to light. Extraocular movements were full, visual field were full on confrontational test. Facial sensation and strength  were normal. Uvula tongue midline. Head turning and shoulder shrug  were normal and symmetric. Motor: The motor testing reveals 5 over 5 strength of all 4 extremities. Good symmetric motor tone is noted throughout.  Sensory: Sensory testing is intact to soft touch on all 4 extremities. No evidence of extinction is noted.  Coordination: Cerebellar testing reveals good finger-nose-finger and heel-to-shin bilaterally.  Gait and station: Gait is normal.  DIAGNOSTIC DATA (LABS, IMAGING, TESTING) - I reviewed patient records, labs, notes, testing and imaging myself where available.  No flowsheet data found.   Lab Results  Component Value Date   WBC 8.1 06/01/2018   HGB 13.1 06/01/2018   HCT 37.8 06/01/2018   MCV 88.1 06/01/2018   PLT 249.0 06/01/2018      Component Value Date/Time   NA 141 06/01/2019 0736   K 3.6 06/01/2019 0736   CL 99 06/01/2019 0736   CO2 26 06/01/2019 0736   GLUCOSE 116 (H) 06/01/2019 0736   GLUCOSE 80 06/01/2018 1435   BUN 12 06/01/2019 0736   CREATININE 1.01 (H) 06/01/2019 0736   CREATININE 0.85 05/10/2016 1101   CALCIUM 9.5 06/01/2019 0736   PROT 7.2 06/01/2018 1435   PROT 6.6 12/22/2017 0934   ALBUMIN 4.4 06/01/2018 1435   ALBUMIN 4.5 12/22/2017 0934   AST 21 06/01/2018 1435   ALT 24 06/01/2018 1435   ALKPHOS 63 06/01/2018 1435   BILITOT 0.4 06/01/2018 1435   BILITOT 0.5 12/22/2017 0934   GFRNONAA 65 06/01/2019 0736   GFRNONAA 81 05/10/2016 1101   GFRAA 74 06/01/2019 0736   GFRAA >89 05/10/2016 1101   Lab Results  Component Value Date   CHOL 146 12/22/2017   HDL 49 12/22/2017   LDLCALC 79 12/22/2017   TRIG 92 12/22/2017   CHOLHDL 3.0 12/22/2017   Lab Results  Component Value Date   HGBA1C 5.5 06/01/2018   Lab Results  Component Value Date   VITAMINB12 318 01/03/2013   Lab Results  Component Value Date   TSH 2.28 06/01/2018       ASSESSMENT AND PLAN 54 y.o. year old female  has a past  medical history of Allergy, Diverticulitis,  GERD (gastroesophageal reflux disease) (1993), Hypertension (1993), Sleep apnea, and Vitamin D deficiency (2011). here with     ICD-10-CM   1. Obstructive sleep apnea treated with continuous positive airway pressure (CPAP)  G47.33 For home use only DME continuous positive airway pressure (CPAP)   Z99.89   2. Fatigue, unspecified type  R53.83   3. Episodic tension-type headache, not intractable  G44.219   4. Hair loss  L65.9      She continues to do well with CPAP therapy.  Compliance report reveals excellent compliance.  She is encouraged to continue using CPAP nightly and for greater than 4 hours each night.  Supply orders have been placed for the next year.  She does note intermittent fatigue and headaches. Fortunately, headaches are mild and easily aborted with Tylenol. BP has been slightly elevated. She was encouraged to continue close follow up with cardiology. She also notes increased hair loss and cold intolerance but no changes in skin, nails, or bowel habits. She has gained about 9 pounds this year. She reports weight loss is difficult. She had labs last year that were unremarkable. She has follow up with PCP this month and was advised to request labs for evaluation. CPAP report looked great. I suspect that fatigue and headaches could be contributed to stress and work schedules. She does feel better on days off. Well balanced diet and regular exercise encouraged. She will follow-up with me in 1 year, sooner if needed.  She verbalizes understanding and agreement with this plan.  Orders Placed This Encounter  Procedures  . For home use only DME continuous positive airway pressure (CPAP)    Supplies    Order Specific Question:   Length of Need    Answer:   Lifetime    Order Specific Question:   Patient has OSA or probable OSA    Answer:   Yes    Order Specific Question:   Is the patient currently using CPAP in the home    Answer:   Yes    Order Specific Question:   Settings    Answer:    Other see comments    Order Specific Question:   CPAP supplies needed    Answer:   Mask, headgear, cushions, filters, heated tubing and water chamber     No orders of the defined types were placed in this encounter.     I spent 25 minutes with the patient. 50% of this time was spent counseling and educating patient on plan of care and medications.    Shawnie Dapper, FNP-C 06/13/2019, 2:34 PM [facility] [address] [place], [place] [address] [phone]  I reviewed the above note and documentation by the Nurse Practitioner and agree with the history, exam, assessment and plan as outlined above. I was available for consultation. Huston Foley, MD, PhD [facility])

## 2019-06-13 NOTE — Patient Instructions (Signed)
Please continue using your CPAP regularly. While your insurance requires that you use CPAP at least 4 hours each night on 70% of the nights, I recommend, that you not skip any nights and use it throughout the night if you can. Getting used to CPAP and staying with the treatment long term does take time and patience and discipline. Untreated obstructive sleep apnea when it is moderate to severe can have an adverse impact on cardiovascular health and raise her risk for heart disease, arrhythmias, hypertension, congestive heart failure, stroke and diabetes. Untreated obstructive sleep apnea causes sleep disruption, nonrestorative sleep, and sleep deprivation. This can have an impact on your day to day functioning and cause daytime sleepiness and impairment of cognitive function, memory loss, mood disturbance, and problems focussing. Using CPAP regularly can improve these symptoms.   Work closely with PCP for concerns of fatigue and hair loss. May be beneficial to recheck labs.   Work on healthy lifestyle habits with well balanced diet, regular exercise and adequate hydration.   Follow up with me in 1 year, sooner if needed    Sleep Apnea Sleep apnea affects breathing during sleep. It causes breathing to stop for a short time or to become shallow. It can also increase the risk of:  Heart attack.  Stroke.  Being very overweight (obese).  Diabetes.  Heart failure.  Irregular heartbeat. The goal of treatment is to help you breathe normally again. What are the causes? There are three kinds of sleep apnea:  Obstructive sleep apnea. This is caused by a blocked or collapsed airway.  Central sleep apnea. This happens when the brain does not send the right signals to the muscles that control breathing.  Mixed sleep apnea. This is a combination of obstructive and central sleep apnea. The most common cause of this condition is a collapsed or blocked airway. This can happen if:  Your throat  muscles are too relaxed.  Your tongue and tonsils are too large.  You are overweight.  Your airway is too small. What increases the risk?  Being overweight.  Smoking.  Having a small airway.  Being older.  Being female.  Drinking alcohol.  Taking medicines to calm yourself (sedatives or tranquilizers).  Having family members with the condition. What are the signs or symptoms?  Trouble staying asleep.  Being sleepy or tired during the day.  Getting angry a lot.  Loud snoring.  Headaches in the morning.  Not being able to focus your mind (concentrate).  Forgetting things.  Less interest in sex.  Mood swings.  Personality changes.  Feelings of sadness (depression).  Waking up a lot during the night to pee (urinate).  Dry mouth.  Sore throat. How is this diagnosed?  Your medical history.  A physical exam.  A test that is done when you are sleeping (sleep study). The test is most often done in a sleep lab but may also be done at home. How is this treated?   Sleeping on your side.  Using a medicine to get rid of mucus in your nose (decongestant).  Avoiding the use of alcohol, medicines to help you relax, or certain pain medicines (narcotics).  Losing weight, if needed.  Changing your diet.  Not smoking.  Using a machine to open your airway while you sleep, such as: ? An oral appliance. This is a mouthpiece that shifts your lower jaw forward. ? A CPAP device. This device blows air through a mask when you breathe out (exhale). ? An  EPAP device. This has valves that you put in each nostril. ? A BPAP device. This device blows air through a mask when you breathe in (inhale) and breathe out.  Having surgery if other treatments do not work. It is important to get treatment for sleep apnea. Without treatment, it can lead to:  High blood pressure.  Coronary artery disease.  In men, not being able to have an erection (impotence).  Reduced  thinking ability. Follow these instructions at home: Lifestyle  Make changes that your doctor recommends.  Eat a healthy diet.  Lose weight if needed.  Avoid alcohol, medicines to help you relax, and some pain medicines.  Do not use any products that contain nicotine or tobacco, such as cigarettes, e-cigarettes, and chewing tobacco. If you need help quitting, ask your doctor. General instructions  Take over-the-counter and prescription medicines only as told by your doctor.  If you were given a machine to use while you sleep, use it only as told by your doctor.  If you are having surgery, make sure to tell your doctor you have sleep apnea. You may need to bring your device with you.  Keep all follow-up visits as told by your doctor. This is important. Contact a doctor if:  The machine that you were given to use during sleep bothers you or does not seem to be working.  You do not get better.  You get worse. Get help right away if:  Your chest hurts.  You have trouble breathing in enough air.  You have an uncomfortable feeling in your back, arms, or stomach.  You have trouble talking.  One side of your body feels weak.  A part of your face is hanging down. These symptoms may be an emergency. Do not wait to see if the symptoms will go away. Get medical help right away. Call your local emergency services (911 in the [place] Do not drive yourself to the hospital. Summary  This condition affects breathing during sleep.  The most common cause is a collapsed or blocked airway.  The goal of treatment is to help you breathe normally while you sleep. This information is not intended to replace advice given to you by your health care provider. Make sure you discuss any questions you have with your health care provider. Document Revised: 12/30/2017 Document Reviewed: 11/08/2017 Elsevier Patient Education  2020 [employer].

## 2019-06-19 NOTE — Progress Notes (Signed)
Message to [facility] for cpap, received. sy

## 2019-06-22 ENCOUNTER — Ambulatory Visit (INDEPENDENT_AMBULATORY_CARE_PROVIDER_SITE_OTHER): Payer: BC Managed Care – PPO | Admitting: Family

## 2019-06-22 ENCOUNTER — Other Ambulatory Visit: Payer: Self-pay | Admitting: Family

## 2019-06-22 ENCOUNTER — Encounter: Payer: Self-pay | Admitting: Family

## 2019-06-22 ENCOUNTER — Other Ambulatory Visit: Payer: Self-pay

## 2019-06-22 VITALS — BP 132/70 | HR 81 | Temp 98.6°F | Ht 62.7 in | Wt 241.8 lb

## 2019-06-22 DIAGNOSIS — L659 Nonscarring hair loss, unspecified: Secondary | ICD-10-CM

## 2019-06-22 DIAGNOSIS — E559 Vitamin D deficiency, unspecified: Secondary | ICD-10-CM

## 2019-06-22 DIAGNOSIS — Z0000 Encounter for general adult medical examination without abnormal findings: Secondary | ICD-10-CM

## 2019-06-22 DIAGNOSIS — Z13220 Encounter for screening for lipoid disorders: Secondary | ICD-10-CM | POA: Diagnosis not present

## 2019-06-22 LAB — CBC WITH DIFFERENTIAL/PLATELET
Basophils Absolute: 0.1 10*3/uL (ref 0.0–0.1)
Basophils Relative: 1.1 % (ref 0.0–3.0)
Eosinophils Absolute: 0.1 10*3/uL (ref 0.0–0.7)
Eosinophils Relative: 1.9 % (ref 0.0–5.0)
HCT: 36.5 % (ref 36.0–46.0)
Hemoglobin: 12.6 g/dL (ref 12.0–15.0)
Lymphocytes Relative: 30.7 % (ref 12.0–46.0)
Lymphs Abs: 2.3 10*3/uL (ref 0.7–4.0)
MCHC: 34.5 g/dL (ref 30.0–36.0)
MCV: 88.2 fl (ref 78.0–100.0)
Monocytes Absolute: 0.6 10*3/uL (ref 0.1–1.0)
Monocytes Relative: 7.3 % (ref 3.0–12.0)
Neutro Abs: 4.5 10*3/uL (ref 1.4–7.7)
Neutrophils Relative %: 59 % (ref 43.0–77.0)
Platelets: 266 10*3/uL (ref 150.0–400.0)
RBC: 4.14 Mil/uL (ref 3.87–5.11)
RDW: 13.7 % (ref 11.5–15.5)
WBC: 7.6 10*3/uL (ref 4.0–10.5)

## 2019-06-22 LAB — COMPREHENSIVE METABOLIC PANEL
ALT: 33 U/L (ref 0–35)
AST: 35 U/L (ref 0–37)
Albumin: 4.3 g/dL (ref 3.5–5.2)
Alkaline Phosphatase: 54 U/L (ref 39–117)
BUN: 13 mg/dL (ref 6–23)
CO2: 31 mEq/L (ref 19–32)
Calcium: 9.6 mg/dL (ref 8.4–10.5)
Chloride: 101 mEq/L (ref 96–112)
Creatinine, Ser: 0.83 mg/dL (ref 0.40–1.20)
GFR: 72.31 mL/min (ref >60.00)
Glucose, Bld: 108 mg/dL — ABNORMAL HIGH (ref 70–99)
Potassium: 3.3 mEq/L — ABNORMAL LOW (ref 3.5–5.1)
Sodium: 138 mEq/L (ref 135–145)
Total Bilirubin: 0.6 mg/dL (ref 0.2–1.2)
Total Protein: 7.1 g/dL (ref 6.0–8.3)

## 2019-06-22 LAB — LIPID PANEL
Cholesterol: 133 mg/dL (ref 0–200)
HDL: 44.9 mg/dL (ref >39.00)
LDL Cholesterol: 65 mg/dL (ref 0–99)
NonHDL: 88.55
Total CHOL/HDL Ratio: 3
Triglycerides: 116 mg/dL (ref 0.0–149.0)
VLDL: 23.2 mg/dL (ref 0.0–40.0)

## 2019-06-22 LAB — TSH: TSH: 0.99 u[IU]/mL (ref 0.35–4.50)

## 2019-06-22 LAB — VITAMIN D 25 HYDROXY (VIT D DEFICIENCY, FRACTURES): VITD: 16.79 ng/mL — ABNORMAL LOW (ref 30.00–100.00)

## 2019-06-22 MED ORDER — VITAMIN D (ERGOCALCIFEROL) 1.25 MG (50000 UNIT) PO CAPS: 50000.0000 [IU] | ORAL_CAPSULE | ORAL | 0 refills | Status: AC

## 2019-06-22 NOTE — Patient Instructions (Signed)
Health Maintenance, Female Adopting a healthy lifestyle and getting preventive care are important in promoting health and wellness. Ask your health care provider about:  The right schedule for you to have regular tests and exams.  Things you can do on your own to prevent diseases and keep yourself healthy. What should I know about diet, weight, and exercise? Eat a healthy diet   Eat a diet that includes plenty of vegetables, fruits, low-fat dairy products, and lean protein.  Do not eat a lot of foods that are high in solid fats, added sugars, or sodium. Maintain a healthy weight Body mass index (BMI) is used to identify weight problems. It estimates body fat based on height and weight. Your health care provider can help determine your BMI and help you achieve or maintain a healthy weight. Get regular exercise Get regular exercise. This is one of the most important things you can do for your health. Most adults should:  Exercise for at least 150 minutes each week. The exercise should increase your heart rate and make you sweat (moderate-intensity exercise).  Do strengthening exercises at least twice a week. This is in addition to the moderate-intensity exercise.  Spend less time sitting. Even light physical activity can be beneficial. Watch cholesterol and blood lipids Have your blood tested for lipids and cholesterol at 52 years of age, then have this test every 5 years. Have your cholesterol levels checked more often if:  Your lipid or cholesterol levels are high.  You are older than 52 years of age.  You are at high risk for heart disease. What should I know about cancer screening? Depending on your health history and family history, you may need to have cancer screening at various ages. This may include screening for:  Breast cancer.  Cervical cancer.  Colorectal cancer.  Skin cancer.  Lung cancer. What should I know about heart disease, diabetes, and high blood  pressure? Blood pressure and heart disease  High blood pressure causes heart disease and increases the risk of stroke. This is more likely to develop in people who have high blood pressure readings, are of African descent, or are overweight.  Have your blood pressure checked: ? Every 3-5 years if you are 18-39 years of age. ? Every year if you are 40 years old or older. Diabetes Have regular diabetes screenings. This checks your fasting blood sugar level. Have the screening done:  Once every three years after age 40 if you are at a normal weight and have a low risk for diabetes.  More often and at a younger age if you are overweight or have a high risk for diabetes. What should I know about preventing infection? Hepatitis B If you have a higher risk for hepatitis B, you should be screened for this virus. Talk with your health care provider to find out if you are at risk for hepatitis B infection. Hepatitis C Testing is recommended for:  Everyone born from 1945 through 1965.  Anyone with known risk factors for hepatitis C. Sexually transmitted infections (STIs)  Get screened for STIs, including gonorrhea and chlamydia, if: ? You are sexually active and are younger than 52 years of age. ? You are older than 52 years of age and your health care provider tells you that you are at risk for this type of infection. ? Your sexual activity has changed since you were last screened, and you are at increased risk for chlamydia or gonorrhea. Ask your health care provider if   you are at risk.  Ask your health care provider about whether you are at high risk for HIV. Your health care provider may recommend a prescription medicine to help prevent HIV infection. If you choose to take medicine to prevent HIV, you should first get tested for HIV. You should then be tested every 3 months for as long as you are taking the medicine. Pregnancy  If you are about to stop having your period (premenopausal) and  you may become pregnant, seek counseling before you get pregnant.  Take 400 to 800 micrograms (mcg) of folic acid every day if you become pregnant.  Ask for birth control (contraception) if you want to prevent pregnancy. Osteoporosis and menopause Osteoporosis is a disease in which the bones lose minerals and strength with aging. This can result in bone fractures. If you are 65 years old or older, or if you are at risk for osteoporosis and fractures, ask your health care provider if you should:  Be screened for bone loss.  Take a calcium or vitamin D supplement to lower your risk of fractures.  Be given hormone replacement therapy (HRT) to treat symptoms of menopause. Follow these instructions at home: Lifestyle  Do not use any products that contain nicotine or tobacco, such as cigarettes, e-cigarettes, and chewing tobacco. If you need help quitting, ask your health care provider.  Do not use street drugs.  Do not share needles.  Ask your health care provider for help if you need support or information about quitting drugs. Alcohol use  Do not drink alcohol if: ? Your health care provider tells you not to drink. ? You are pregnant, may be pregnant, or are planning to become pregnant.  If you drink alcohol: ? Limit how much you use to 0-1 drink a day. ? Limit intake if you are breastfeeding.  Be aware of how much alcohol is in your drink. In the [place] one drink equals one 12 oz bottle of beer (355 mL), one 5 oz glass of wine (148 mL), or one 1 oz glass of hard liquor (44 mL). General instructions  Schedule regular health, dental, and eye exams.  Stay current with your vaccines.  Tell your health care provider if: ? You often feel depressed. ? You have ever been abused or do not feel safe at home. Summary  Adopting a healthy lifestyle and getting preventive care are important in promoting health and wellness.  Follow your health care provider's instructions about healthy  diet, exercising, and getting tested or screened for diseases.  Follow your health care provider's instructions on monitoring your cholesterol and blood pressure. This information is not intended to replace advice given to you by your health care provider. Make sure you discuss any questions you have with your health care provider. Document Revised: 03/08/2018 Document Reviewed: 03/08/2018 Elsevier Patient Education  2020 Elsevier Inc.  

## 2019-06-22 NOTE — Progress Notes (Signed)
Amanda [name] is a 54 y.o. female with the following history as recorded in [facility]:  Patient Active Problem List   Diagnosis Date Noted  . Obstructive sleep apnea treated with continuous positive airway pressure (CPAP) 06/01/2018  . Pre-diabetes 06/01/2018  . Hyperlipidemia with target LDL less than 100 01/06/2018  . Stress incontinence of urine 06/26/2017  . Encounter for general adult medical examination with abnormal findings 05/20/2017  . Non-seasonal allergic rhinitis 04/14/2017  . Moderate obstructive sleep apnea 05/20/2016  . Recurrent sinus infections 06/12/2014  . Chest pain 01/03/2014  . GERD (gastroesophageal reflux disease) 03/08/2012  . Benign essential hypertension 03/08/2012    Current Outpatient Medications  Medication Sig Dispense Refill  . cetirizine (EQ ALLERGY RELIEF, CETIRIZINE,) 10 MG tablet Take 1 tablet (10 mg total) by mouth daily. 90 tablet 2  . chlorthalidone (HYGROTON) 25 MG tablet Take 0.5 tablets (12.5 mg total) by mouth daily. 45 tablet 3  . losartan (COZAAR) 100 MG tablet Take 1 tablet (100 mg total) by mouth daily. 90 tablet 2  . Multiple Vitamins-Minerals (PRESERVISION AREDS 2) CAPS Take by mouth.    Marland Kitchen omeprazole (PRILOSEC) 20 MG capsule Take 1 capsule (20 mg total) by mouth daily. 90 capsule 2  . potassium chloride (KLOR-CON) 10 MEQ tablet Take 2 tablets (20 mEq total) by mouth daily. 90 tablet 3  . rosuvastatin (CRESTOR) 10 MG tablet Take 1 tablet (10 mg total) by mouth daily. 90 tablet 2   No current facility-administered medications for this visit.    Allergies: Patient has no known allergies.  Past Medical History:  Diagnosis Date  . Allergy   . Diverticulitis   . GERD (gastroesophageal reflux disease) 1993  . Hypertension 1993  . Sleep apnea   . Vitamin D deficiency 2011    Past Surgical History:  Procedure Laterality Date  . ABDOMINAL HYSTERECTOMY    . CHOLECYSTECTOMY    . CHOLECYSTECTOMY, LAPAROSCOPIC    . TUBAL LIGATION       Family History  Problem Relation Age of Onset  . Hypertension Mother   . Lupus Mother   . Hypertension Father   . Diabetes Father   . Heart disease Father   . Prostate cancer Father   . Asthma Brother   . Asthma Sister   . Cancer Sister        breast cancer   . Breast cancer Sister 56  . Diabetes Sister   . Diabetes Brother   . Hypertension Sister   . Hypertension Brother     Social History   Tobacco Use  . Smoking status: Never Smoker  . Smokeless tobacco: Never Used  Substance Use Topics  . 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  Head: Normocephalic and atraumatic  Eyes: Sclera and conjunctiva clear; pupils round and reactive to light; extraocular movements intact  Ears:  External normal; canals clear; tympanic membranes normal  Oropharynx: Pink, supple. No suspicious lesions  Neck: Supple without thyromegaly, adenopathy  Lungs: Respirations unlabored; clear to auscultation bilaterally without wheeze, rales, rhonchi  CVS exam: normal rate and regular rhythm.  Abdomen: Soft; nontender; nondistended; normoactive bowel sounds; no masses or hepatosplenomegaly  Musculoskeletal: No deformities; no active joint inflammation  Extremities: No edema, cyanosis, clubbing  Vessels: Symmetric bilaterally  Neurologic: Alert and oriented; speech intact; face symmetrical; moves all extremities well; CNII-XII intact without  focal deficit   Assessment:  1. PE (physical exam), annual   2. Hair loss   3. Lipid screening   4. Vitamin D deficiency     Plan:  Age appropriate preventive healthcare needs addressed; encouraged regular eye doctor and dental exams; encouraged regular exercise and weight loss; will update labs and refills as needed today; follow-up to be determined;  This visit occurred during the SARS-CoV-2 public health emergency.  Safety protocols were in place, including screening questions prior to the visit, additional usage of staff PPE, and extensive cleaning of exam room while observing appropriate contact time as indicated for disinfecting solutions.      No follow-ups on file.  Orders Placed This Encounter  Procedures  . CBC with Differential/Platelet  . Comp Met (CMET)  . Lipid panel  . TSH  . Vitamin D (25 hydroxy)  . Ambulatory referral to Dermatology    Referral Priority:   Routine    Referral Type:   Consultation    Referral Reason:   Specialty Services Required    Requested Specialty:   Dermatology    Number of Visits Requested:   1    Requested Prescriptions    No prescriptions requested or ordered in this encounter

## 2019-06-27 ENCOUNTER — Telehealth: Payer: Self-pay | Admitting: Family

## 2019-06-27 DIAGNOSIS — R7303 Prediabetes: Secondary | ICD-10-CM

## 2019-06-27 NOTE — Telephone Encounter (Signed)
New Message:   Pt has some questions about her lab results/ Issues with sugar readings.

## 2019-07-03 MED ORDER — BLOOD GLUCOSE MONITOR KIT: PACK | 0 refills | Status: DC

## 2019-07-03 NOTE — Telephone Encounter (Signed)
I would like her to come get her Hgba1c checked please. I thought I had ordered with her last labs but it didn't get done. With blood sugars of 230, she is most likely diabetic and will need medication. We can also get her a meter as well.

## 2019-07-03 NOTE — Telephone Encounter (Signed)
Called and spoke with patient today. 

## 2019-07-06 ENCOUNTER — Other Ambulatory Visit (INDEPENDENT_AMBULATORY_CARE_PROVIDER_SITE_OTHER): Payer: BC Managed Care – PPO

## 2019-07-06 ENCOUNTER — Other Ambulatory Visit: Payer: Self-pay

## 2019-07-06 DIAGNOSIS — R7303 Prediabetes: Secondary | ICD-10-CM

## 2019-07-06 LAB — COMPREHENSIVE METABOLIC PANEL
ALT: 31 U/L (ref 0–35)
AST: 33 U/L (ref 0–37)
Albumin: 4.4 g/dL (ref 3.5–5.2)
Alkaline Phosphatase: 81 U/L (ref 39–117)
BUN: 16 mg/dL (ref 6–23)
CO2: 28 mEq/L (ref 19–32)
Calcium: 9.2 mg/dL (ref 8.4–10.5)
Chloride: 101 mEq/L (ref 96–112)
Creatinine, Ser: 1.05 mg/dL (ref 0.40–1.20)
GFR: 55.12 mL/min — ABNORMAL LOW (ref >60.00)
Glucose, Bld: 102 mg/dL — ABNORMAL HIGH (ref 70–99)
Potassium: 3.1 mEq/L — ABNORMAL LOW (ref 3.5–5.1)
Sodium: 140 mEq/L (ref 135–145)
Total Bilirubin: 0.6 mg/dL (ref 0.2–1.2)
Total Protein: 7.1 g/dL (ref 6.0–8.3)

## 2019-07-06 LAB — HEMOGLOBIN A1C: Hgb A1c MFr Bld: 5.9 % (ref 4.6–6.5)

## 2019-08-11 DIAGNOSIS — J069 Acute upper respiratory infection, unspecified: Secondary | ICD-10-CM | POA: Diagnosis not present

## 2019-08-11 DIAGNOSIS — Z20828 Contact with and (suspected) exposure to other viral communicable diseases: Secondary | ICD-10-CM | POA: Diagnosis not present

## 2019-08-22 DIAGNOSIS — L649 Androgenic alopecia, unspecified: Secondary | ICD-10-CM | POA: Diagnosis not present

## 2019-08-23 DIAGNOSIS — G4733 Obstructive sleep apnea (adult) (pediatric): Secondary | ICD-10-CM | POA: Diagnosis not present

## 2019-08-26 ENCOUNTER — Other Ambulatory Visit: Payer: Self-pay | Admitting: Family

## 2019-08-26 DIAGNOSIS — K219 Gastro-esophageal reflux disease without esophagitis: Secondary | ICD-10-CM

## 2019-09-04 ENCOUNTER — Other Ambulatory Visit: Payer: Self-pay | Admitting: Family

## 2019-09-04 DIAGNOSIS — I10 Essential (primary) hypertension: Secondary | ICD-10-CM

## 2019-10-24 ENCOUNTER — Other Ambulatory Visit: Payer: Self-pay | Admitting: Family

## 2019-11-12 DIAGNOSIS — M25572 Pain in left ankle and joints of left foot: Secondary | ICD-10-CM | POA: Diagnosis not present

## 2019-11-14 ENCOUNTER — Other Ambulatory Visit: Payer: Self-pay | Admitting: Cardiovascular Disease

## 2020-01-01 DIAGNOSIS — G4733 Obstructive sleep apnea (adult) (pediatric): Secondary | ICD-10-CM | POA: Diagnosis not present

## 2020-01-08 DIAGNOSIS — Z23 Encounter for immunization: Secondary | ICD-10-CM | POA: Diagnosis not present

## 2020-01-14 ENCOUNTER — Telehealth: Payer: Self-pay | Admitting: Family Medicine

## 2020-01-14 NOTE — Telephone Encounter (Signed)
Spoke to pt and let her know that I will IM them to see what I can find out.  Will let her know.

## 2020-01-14 NOTE — Telephone Encounter (Signed)
Pt called, received call from [employer], trying to bill insurance and need signature from the physician. Would like a call from the nurse.

## 2020-01-15 NOTE — Telephone Encounter (Signed)
Amanda Delton, RN I'm not sure, I don't see any record of [place] reaching out to the pt. I don't believe we need anything at this time. Sorry for any confusion!

## 2020-01-23 NOTE — Telephone Encounter (Signed)
Pt called, [employer] contacted me, claim coming back. Need a prescription from physician for my CPAP supplies. Would like a call from the nurse.

## 2020-01-23 NOTE — Telephone Encounter (Signed)
I messaged [employer] to see if last order dated 05-2019 is good for her supplies.

## 2020-01-24 NOTE — Telephone Encounter (Signed)
I called pt and relayed that I spoke to sheila at [employer].  She stated that no balance showing.  Pt received supplies 01-04-20.  No need for any order.  Pt has appt 05-2020 scheduled.  Pt appreciative.

## 2020-01-28 ENCOUNTER — Other Ambulatory Visit: Payer: Self-pay | Admitting: Family

## 2020-01-28 NOTE — Telephone Encounter (Signed)
Kathee Delton, RN Yes, orders are good for a year. 01/26/20.

## 2020-01-31 DIAGNOSIS — S61102A Unspecified open wound of left thumb with damage to nail, initial encounter: Secondary | ICD-10-CM | POA: Diagnosis not present

## 2020-02-01 DIAGNOSIS — G4733 Obstructive sleep apnea (adult) (pediatric): Secondary | ICD-10-CM | POA: Diagnosis not present

## 2020-03-25 ENCOUNTER — Other Ambulatory Visit: Payer: Self-pay | Admitting: Family

## 2020-03-25 DIAGNOSIS — Z1231 Encounter for screening mammogram for malignant neoplasm of breast: Secondary | ICD-10-CM

## 2020-04-02 DIAGNOSIS — Z20828 Contact with and (suspected) exposure to other viral communicable diseases: Secondary | ICD-10-CM | POA: Diagnosis not present

## 2020-04-03 ENCOUNTER — Other Ambulatory Visit: Payer: Self-pay | Admitting: Family

## 2020-04-07 DIAGNOSIS — U071 COVID-19: Secondary | ICD-10-CM | POA: Diagnosis not present

## 2020-04-07 DIAGNOSIS — Z1152 Encounter for screening for COVID-19: Secondary | ICD-10-CM | POA: Diagnosis not present

## 2020-04-12 DIAGNOSIS — Z1152 Encounter for screening for COVID-19: Secondary | ICD-10-CM | POA: Diagnosis not present

## 2020-04-24 DIAGNOSIS — Z23 Encounter for immunization: Secondary | ICD-10-CM | POA: Diagnosis not present

## 2020-04-25 ENCOUNTER — Other Ambulatory Visit: Payer: Self-pay

## 2020-04-25 ENCOUNTER — Encounter: Payer: Self-pay | Admitting: Family

## 2020-04-25 ENCOUNTER — Ambulatory Visit (INDEPENDENT_AMBULATORY_CARE_PROVIDER_SITE_OTHER): Payer: BC Managed Care – PPO | Admitting: Family

## 2020-04-25 VITALS — BP 130/76 | HR 85 | Temp 98.7°F | Wt 240.0 lb

## 2020-04-25 DIAGNOSIS — L089 Local infection of the skin and subcutaneous tissue, unspecified: Secondary | ICD-10-CM

## 2020-04-25 MED ORDER — SULFAMETHOXAZOLE-TRIMETHOPRIM 800-160 MG PO TABS
1.0000 | ORAL_TABLET | Freq: Two times a day (BID) | ORAL | 0 refills | Status: DC
Start: 2020-04-25 — End: 2020-05-06

## 2020-04-25 NOTE — Progress Notes (Signed)
Amanda [name] is a 55 y.o. female with the following history as recorded in EpicCare:  Patient Active Problem List   Diagnosis Date Noted  . Obstructive sleep apnea treated with continuous positive airway pressure (CPAP) 06/01/2018  . Pre-diabetes 06/01/2018  . Hyperlipidemia with target LDL less than 100 01/06/2018  . Stress incontinence of urine 06/26/2017  . Encounter for general adult medical examination with abnormal findings 05/20/2017  . Non-seasonal allergic rhinitis 04/14/2017  . Moderate obstructive sleep apnea 05/20/2016  . Recurrent sinus infections 06/12/2014  . Chest pain 01/03/2014  . GERD (gastroesophageal reflux disease) 03/08/2012  . Benign essential hypertension 03/08/2012    Current Outpatient Medications  Medication Sig Dispense Refill  . cetirizine (ZYRTEC) 10 MG tablet TAKE 1 TABLET DAILY 90 tablet 3  . ONETOUCH VERIO test strip USE UP TO FOUR TIMES A DAY AS DIRECTED 100 strip 13  . rosuvastatin (CRESTOR) 10 MG tablet TAKE 1 TABLET DAILY 90 tablet 3  . sulfamethoxazole-trimethoprim (BACTRIM DS) 800-160 MG tablet Take 1 tablet by mouth 2 (two) times daily. 10 tablet 0  . blood glucose meter kit and supplies KIT Dispense based on patient and insurance preference. Use up to four times daily as directed. (FOR ICD-9 250.00, 250.01). 1 each 0  . losartan (COZAAR) 100 MG tablet TAKE 1 TABLET DAILY 90 tablet 3  . Multiple Vitamins-Minerals (PRESERVISION AREDS 2) CAPS Take by mouth.    Marland Kitchen omeprazole (PRILOSEC) 20 MG capsule TAKE 1 CAPSULE DAILY 90 capsule 3   No current facility-administered medications for this visit.    Allergies: Patient has no known allergies.  Past Medical History:  Diagnosis Date  . Allergy   . Diverticulitis   . GERD (gastroesophageal reflux disease) 1993  . Hypertension 1993  . Sleep apnea   . Vitamin D deficiency 2011    Past Surgical History:  Procedure Laterality Date  . ABDOMINAL HYSTERECTOMY    . CHOLECYSTECTOMY    .  CHOLECYSTECTOMY, LAPAROSCOPIC    . TUBAL LIGATION      Family History  Problem Relation Age of Onset  . Hypertension Mother   . Lupus Mother   . Hypertension Father   . Diabetes Father   . Heart disease Father   . Prostate cancer Father   . Asthma Brother   . Asthma Sister   . Cancer Sister        breast cancer   . Breast cancer Sister 39  . Diabetes Sister   . Diabetes Brother   . Hypertension Sister   . Hypertension Brother     Social History   Tobacco Use  . Smoking status: Never Smoker  . Smokeless tobacco: Never Used  Substance Use Topics  . Alcohol use: No    Subjective:   Noticed a "small red bump" on her outer right breast this morning while in the shower; no pain; due for mammogram in February but cannot schedule until the end of March as she just had her COVID booster.     Objective:  Vitals:   04/25/20 1515  BP: 130/76  Pulse: 85  Temp: 98.7 F (37.1 C)  TempSrc: Oral  SpO2: 97%  Weight: 240 lb (108.9 kg)    General: Well developed, well nourished, in no acute distress  Skin : Warm and dry. Small single erythematous lesion noted on right breast- superficial;  Head: Normocephalic and atraumatic  Lungs: Respirations unlabored; Neurologic: Alert and oriented; speech intact; face symmetrical;   Assessment:  1. Skin infection  Plan:  Reassurance; apply moist head and will treat with Bactrim DS bid x 5 days; do not feel diagnostic mammogram needed- plan for screening mammogram in March;  This visit occurred during the SARS-CoV-2 public health emergency.  Safety protocols were in place, including screening questions prior to the visit, additional usage of staff PPE, and extensive cleaning of exam room while observing appropriate contact time as indicated for disinfecting solutions.      No follow-ups on file.  No orders of the defined types were placed in this encounter.   Requested Prescriptions   Signed Prescriptions Disp Refills  .  sulfamethoxazole-trimethoprim (BACTRIM DS) 800-160 MG tablet 10 tablet 0    Sig: Take 1 tablet by mouth 2 (two) times daily.

## 2020-05-01 DIAGNOSIS — G4733 Obstructive sleep apnea (adult) (pediatric): Secondary | ICD-10-CM | POA: Diagnosis not present

## 2020-05-05 ENCOUNTER — Ambulatory Visit: Payer: BC Managed Care – PPO

## 2020-05-06 ENCOUNTER — Other Ambulatory Visit: Payer: Self-pay

## 2020-05-06 ENCOUNTER — Encounter: Payer: Self-pay | Admitting: Physician Assistant

## 2020-05-06 ENCOUNTER — Ambulatory Visit (INDEPENDENT_AMBULATORY_CARE_PROVIDER_SITE_OTHER): Payer: BC Managed Care – PPO | Admitting: Physician Assistant

## 2020-05-06 ENCOUNTER — Ambulatory Visit: Payer: BC Managed Care – PPO | Admitting: Physician Assistant

## 2020-05-06 VITALS — BP 116/76 | HR 80 | Ht 62.0 in | Wt 243.0 lb

## 2020-05-06 DIAGNOSIS — E785 Hyperlipidemia, unspecified: Secondary | ICD-10-CM

## 2020-05-06 DIAGNOSIS — I2510 Atherosclerotic heart disease of native coronary artery without angina pectoris: Secondary | ICD-10-CM

## 2020-05-06 DIAGNOSIS — I10 Essential (primary) hypertension: Secondary | ICD-10-CM

## 2020-05-06 NOTE — Patient Instructions (Signed)
Medication Instructions:  Start Aspirin 81 mg daily   *If you need a refill on your cardiac medications before your next appointment, please call your pharmacy*   Lab Work: None ordered   If you have labs (blood work) drawn today and your tests are completely normal, you will receive your results only by: Marland Kitchen MyChart Message (if you have MyChart) OR . A paper copy in the mail If you have any lab test that is abnormal or we need to change your treatment, we will call you to review the results.   Testing/Procedures: None ordered    Follow-Up: At [facility], you and your health needs are our priority.  As part of our continuing mission to provide you with exceptional heart care, we have created designated Provider Care Teams.  These Care Teams include your primary Cardiologist (physician) and Advanced Practice Providers (APPs -  Physician Assistants and Nurse Practitioners) who all work together to provide you with the care you need, when you need it.  We recommend signing up for the patient portal called "MyChart".  Sign up information is provided on this After Visit Summary.  MyChart is used to connect with patients for Virtual Visits (Telemedicine).  Patients are able to view lab/test results, encounter notes, upcoming appointments, etc.  Non-urgent messages can be sent to your provider as well.   To learn more about what you can do with MyChart, go to [email or web address].    Your next appointment:   12 month(s)  The format for your next appointment:   In Person  Provider:   You may see Kristeen Miss, MD or one of the following Advanced Practice Providers on your designated Care Team:    Tereso Newcomer, PA-C  Chelsea Aus, [place]    Other Instructions None

## 2020-05-06 NOTE — Progress Notes (Signed)
Cardiology Office Note:    Date:  05/06/2020   ID:  Amanda [name], DOB March 06, 1966, MRN [id number]  PCP:  Amanda [name], [facility] Cardiologist:  Mertie Moores, MD   [facility] Electrophysiologist:  None   Referring MD: Amanda [name],*   Chief Complaint:  Follow-up (CAD, HTN, HLP)    Patient Profile:    Amanda [name] is a 55 y.o. female with:   Hypertension   Coronary artery disease   Mild non-obs dz by Coronary CTA 7/19  Hyperlipidemia   OSA on CPAP  Prior CV studies: Coronary CTA 10/06/17 LAD prox and mid 0-25 Ca Score 1 (86th percentile)  Echocardiogram 06/24/14 EF 55-60, no RWMA, Gr 1 DD  History of Present Illness:    Amanda [name] was last seen by Dr. Acie Fredrickson in 2/21.  She returns for f/u.  She is here alone.  She could not tolerate chlorthalidone.  She was nauseated and lightheaded. She stopped this and has felt fine since.  She has been doing well without chest pain, shortness of breath, syncope, leg edema, orthopnea.  She uses CPAP at night.     Past Medical History:  Diagnosis Date  . Allergy   . Diverticulitis   . GERD (gastroesophageal reflux disease) 1993  . Hypertension 1993  . Sleep apnea   . Vitamin D deficiency 2011    Current Medications: Current Meds  Medication Sig  . aspirin EC 81 MG tablet Take 81 mg by mouth daily. Swallow whole.  . blood glucose meter kit and supplies KIT Dispense based on patient and insurance preference. Use up to four times daily as directed. (FOR ICD-9 250.00, 250.01).  . cetirizine (ZYRTEC) 10 MG tablet TAKE 1 TABLET DAILY  . losartan (COZAAR) 100 MG tablet TAKE 1 TABLET DAILY  . Multiple Vitamins-Minerals (PRESERVISION AREDS 2) CAPS Take by mouth.  Marland Kitchen omeprazole (PRILOSEC) 20 MG capsule TAKE 1 CAPSULE DAILY  . ONETOUCH VERIO test strip USE UP TO FOUR TIMES A DAY AS DIRECTED  . rosuvastatin (CRESTOR) 10 MG tablet TAKE 1 TABLET DAILY     Allergies:   Patient has no known  allergies.   Social History   Tobacco Use  . Smoking status: Never Smoker  . Smokeless tobacco: Never Used  Substance Use Topics  . Alcohol use: No  . Drug use: No     Family Hx: The patient's family history includes Asthma in her brother and sister; Breast cancer (age of onset: 14) in her sister; Cancer in her sister; Diabetes in her brother, father, and sister; Heart disease in her father; Hypertension in her brother, father, mother, and sister; Lupus in her mother; Prostate cancer in her father.  ROS   EKGs/Labs/Other Test Reviewed:    EKG:  EKG is   ordered today.  The ekg ordered today demonstrates normal sinus rhythm, HR 73, normal axis, non-specific ST-TW changes, QTc 436   Recent Labs: 06/22/2019: Hemoglobin 12.6; Platelets 266.0; TSH 0.99 07/06/2019: ALT 31; BUN 16; Creatinine, Ser 1.05; Potassium 3.1; Sodium 140   Recent Lipid Panel Lab Results  Component Value Date/Time   CHOL 133 06/22/2019 01:47 PM   CHOL 146 12/22/2017 09:34 AM   TRIG 116.0 06/22/2019 01:47 PM   HDL 44.90 06/22/2019 01:47 PM   HDL 49 12/22/2017 09:34 AM   CHOLHDL 3 06/22/2019 01:47 PM   LDLCALC 65 06/22/2019 01:47 PM   LDLCALC 79 12/22/2017 09:34 AM      Risk Assessment/Calculations:  Physical Exam:    VS:  BP 116/76   Pulse 80   Ht 5' 2" (1.575 m)   Wt 243 lb (110.2 kg)   SpO2 98%   BMI 44.45 kg/m     Wt Readings from Last 3 Encounters:  05/06/20 243 lb (110.2 kg)  04/25/20 240 lb (108.9 kg)  06/22/19 241 lb 12.8 oz (109.7 kg)     Constitutional:      Appearance: Healthy appearance. Not in distress.  Neck:     Vascular: No JVR. JVD normal.  Pulmonary:     Effort: Pulmonary effort is normal.     Breath sounds: No wheezing. No rales.  Cardiovascular:     Normal rate. Regular rhythm. Normal S1. Normal S2.     Murmurs: There is no murmur.  Edema:    Peripheral edema absent.  Abdominal:     Palpations: Abdomen is soft. There is no hepatomegaly.  Skin:    General:  Skin is warm and dry.  Neurological:     Mental Status: Alert and oriented to person, place and time.     Cranial Nerves: Cranial nerves are intact.       ASSESSMENT & PLAN:    1. Coronary artery disease involving native coronary artery of native heart without angina pectoris Mild non-obstructive coronary artery disease by CT in 2019.  She is doing well without chest pain.  Continue statin Rx. Start ASA 81 mg once daily. F/u 1 year.    2. Essential hypertension The patient's blood pressure is controlled on her current regimen.  Continue current therapy.    3. Hyperlipidemia with target LDL less than 100 LDL optimal on most recent lab work.  Continue current Rx.  She will have repeat fasting labs with primary care in 06/2020.      Dispo:  Return in about 1 year (around 05/06/2021) for Routine Follow Up, w/ Dr. Acie Fredrickson, or Richardson Dopp, PA-C, in person.   Medication Adjustments/Labs and Tests Ordered: Current medicines are reviewed at length with the patient today.  Concerns regarding medicines are outlined above.  Tests Ordered: Orders Placed This Encounter  Procedures  . EKG 12-Lead   Medication Changes: No orders of the defined types were placed in this encounter.   Signed, Richardson Dopp, PA-C  05/06/2020 5:33 PM    [facility] [address], [place], [place]  [address] Phone: [phone]; Fax: [phone]

## 2020-05-28 DIAGNOSIS — J189 Pneumonia, unspecified organism: Secondary | ICD-10-CM | POA: Diagnosis not present

## 2020-05-28 DIAGNOSIS — R0600 Dyspnea, unspecified: Secondary | ICD-10-CM | POA: Diagnosis not present

## 2020-05-28 DIAGNOSIS — R062 Wheezing: Secondary | ICD-10-CM | POA: Diagnosis not present

## 2020-06-02 DIAGNOSIS — H524 Presbyopia: Secondary | ICD-10-CM | POA: Diagnosis not present

## 2020-06-02 DIAGNOSIS — E119 Type 2 diabetes mellitus without complications: Secondary | ICD-10-CM | POA: Diagnosis not present

## 2020-06-02 LAB — HM DIABETES EYE EXAM

## 2020-06-11 ENCOUNTER — Other Ambulatory Visit: Payer: Self-pay

## 2020-06-11 ENCOUNTER — Ambulatory Visit
Admission: RE | Admit: 2020-06-11 | Discharge: 2020-06-11 | Disposition: A | Discharge status: Home / Self Care | Payer: BC Managed Care – PPO | Source: Ambulatory Visit | Attending: Family | Admitting: Family

## 2020-06-11 ENCOUNTER — Ambulatory Visit: Payer: BC Managed Care – PPO

## 2020-06-11 DIAGNOSIS — Z1231 Encounter for screening mammogram for malignant neoplasm of breast: Secondary | ICD-10-CM | POA: Diagnosis not present

## 2020-06-12 ENCOUNTER — Encounter: Payer: Self-pay | Admitting: Family Medicine

## 2020-06-12 ENCOUNTER — Ambulatory Visit (INDEPENDENT_AMBULATORY_CARE_PROVIDER_SITE_OTHER): Payer: BC Managed Care – PPO | Admitting: Family Medicine

## 2020-06-12 VITALS — BP 110/69 | HR 62 | Ht 62.0 in | Wt 247.0 lb

## 2020-06-12 DIAGNOSIS — Z9989 Dependence on other enabling machines and devices: Secondary | ICD-10-CM

## 2020-06-12 DIAGNOSIS — G4733 Obstructive sleep apnea (adult) (pediatric): Secondary | ICD-10-CM | POA: Diagnosis not present

## 2020-06-12 NOTE — Progress Notes (Addendum)
PATIENT: Amanda [name] DOB: 11/20/1965  REASON FOR VISIT: follow up HISTORY FROM: patient  Chief Complaint  Patient presents with  . Follow-up    RM 1 alone Pt sleeps well, CPAP does help her      HISTORY OF PRESENT ILLNESS:  06/12/20 ALL:  She returns for follow up for OSA on CPAP. She is doing very well. She was diagnosed with Covid in 03/2020 and treated for PNA 05/26/2020. She had a period of about 3-4 days where she could not use her CPAP due to coughing and felt that she did not sleep well at all. She does report feeling significantly better on CPAP therapy. No concerns with machine or supplies.       06/13/2019 ALL:  Amanda [name] is a 55 y.o. female here today for follow up for OSA on CPAP therapy. Overall, she is doing well. She does endorse more fatigue and headaches recently. She wakes up at 3 days a week feeling exhausted. She reports having a mild headache about once weekly, frontal and throbbing. No migraine symptoms or vision changes. Headache resolves with Tylenol. BP has been slightly elevated. She was seen by cardiology and started on chlorthalidone 12.38m daily and potassium supplements. She has also noted significant hair loss that started about 2 years ago. She had labs with PCP that were normal. She was told it was related to stress. She had to place parents in nursing homes in the recent years. Her father died in in J07/04/2017  She is working 12 hour shifts at work. She tries to drink 3 bottles of water daily.   Compliance report dated 05/13/2019 through 06/11/2019 reveals that she has used CPAP 30 of the last 30 days for compliance of 100%.  She used CPAP greater than 4 hours all 30 days for compliance 100%.  Average usage was 7 hours and 45 minutes.  Residual AHI was 1.5 on 12 cm of water and an EPR of 3.  There were no significant leaks noted.  HISTORY: (copied from [facility] note on 06/01/2018)  Amanda [name] a 55year old right-handed  woman with an underlying medical history of hypertension, allergies, reflux disease, vitamin D deficiency, and morbid obesity, who presents for follow-up consultation of her sleep disorder, after home sleep study testing and trial of AutoPap therapy. The patient is unaccompanied today. I last saw her on 08/10/2016, at which time she reported doing much better after starting AutoPap therapy. She felt that her sleep quality and sleep consolidation as well as daytime energy were improved as well as her morning headaches. She was motivated to work on her weight loss and continue with AutoPap. I suggested we proceed with an overnight pulse oximetry test to monitor oxygen saturations for 1 night. She had a pulse ox on 08/24/2016 with a total duration of 5 hours and 56 minutes, average oxygen saturation of 97.3%, nadir of 92%.  Today, 02/10/2017: I reviewed her AutoPap compliance data from 01/10/2017 through 02/08/2017 which is a total of 30 days, during which time she used her machine every night with percent used days greater than 4 hours at 90%, indicating excellent compliance with an average usage of 7 hours and 34 minutes, residual AHI at goal at 1.6 per hour, pressure for the 95th percentile right at 12 cm, leak very low, pressure setting of 4-13 cm with EPR. She reportsdoing well, will start going to the gym. Had a bout of bronchitis, went to [facility] and had a  Zpack. Could not use the machine then. Otherwise doing well. Would be okay trying CPAP of 12 cm, using nasal mask.  UPDATE 3/5/2020CM Amanda [name], 55 year old female returns for follow-up with history of obstructive sleep apnea here for CPAP compliance.  She is doing well with her CPAP.  She claims she has more sleepiness lately.  She was recently switched from an 8-hour shift to a 12-hour shift and she is still adjusting to this.  CPAP data dated 05/01/2018-05/30/2018 shows compliance greater than 4 hours at 83%.  Average usage 7 hours 19 minutes.  Set  pressure 12 cm leak 95th percentile 0.9.  AHI 0.9.  The other 5 days she did not use the machine she had a cold.  She returns for reevaluation   REVIEW OF SYSTEMS: Out of a complete 14 system review of symptoms, the patient complains only of the following symptoms, none and all other reviewed systems are negative.   ESS: 7 FSS: 10   ALLERGIES: No Known Allergies  HOME MEDICATIONS: Outpatient Medications Prior to Visit  Medication Sig Dispense Refill  . aspirin EC 81 MG tablet Take 81 mg by mouth daily. Swallow whole.    . blood glucose meter kit and supplies KIT Dispense based on patient and insurance preference. Use up to four times daily as directed. (FOR ICD-9 250.00, 250.01). 1 each 0  . cetirizine (ZYRTEC) 10 MG tablet TAKE 1 TABLET DAILY 90 tablet 3  . losartan (COZAAR) 100 MG tablet TAKE 1 TABLET DAILY 90 tablet 3  . Multiple Vitamins-Minerals (PRESERVISION AREDS 2) CAPS Take by mouth.    Marland Kitchen omeprazole (PRILOSEC) 20 MG capsule TAKE 1 CAPSULE DAILY 90 capsule 3  . ONETOUCH VERIO test strip USE UP TO FOUR TIMES A DAY AS DIRECTED 100 strip 13  . rosuvastatin (CRESTOR) 10 MG tablet TAKE 1 TABLET DAILY 90 tablet 3   No facility-administered medications prior to visit.    PAST MEDICAL HISTORY: Past Medical History:  Diagnosis Date  . Allergy   . Diverticulitis   . GERD (gastroesophageal reflux disease) 1993  . Hypertension 1993  . Sleep apnea   . Vitamin D deficiency 2011    PAST SURGICAL HISTORY: Past Surgical History:  Procedure Laterality Date  . ABDOMINAL HYSTERECTOMY    . CHOLECYSTECTOMY    . CHOLECYSTECTOMY, LAPAROSCOPIC    . TUBAL LIGATION      FAMILY HISTORY: Family History  Problem Relation Age of Onset  . Hypertension Mother   . Lupus Mother   . Hypertension Father   . Diabetes Father   . Heart disease Father   . Prostate cancer Father   . Asthma Brother   . Asthma Sister   . Cancer Sister        breast cancer   . Breast cancer Sister 37  .  Diabetes Sister   . Diabetes Brother   . Hypertension Sister   . Hypertension Brother     SOCIAL HISTORY: Social History   Socioeconomic History  . Marital status: Widowed    Spouse name: Not on file  . Number of children: 2  . Years of education: GED  . Highest education level: Not on file  Occupational History  . Occupation: Glass blower/designer  Tobacco Use  . Smoking status: Never Smoker  . Smokeless tobacco: Never Used  Substance and Sexual Activity  . Alcohol use: No  . Drug use: No  . Sexual activity: Not on file  Other Topics Concern  . Not on file  Social History Narrative   Lives in [place] with her sister. She works at [employer] living.    Drinks about 2 caffeine drinks a week    Social Determinants of Radio broadcast assistant Strain: Not on file  Food Insecurity: Not on file  Transportation Needs: Not on file  Physical Activity: Not on file  Stress: Not on file  Social Connections: Not on file  Intimate Partner Violence: Not on file      PHYSICAL EXAM  Vitals:   06/12/20 1053  BP: 110/69  Pulse: 62  Weight: 247 lb (112 kg)  Height: '5\' 2"'  (1.575 m)   Body mass index is 45.18 kg/m.  Generalized: Well developed, in no acute distress  Cardiology: normal rate and rhythm, no murmur noted Respiratory: clear to auscultation bilaterally  Respiratory: Clear to auscultation bilaterally Neurological examination  Mentation: Alert oriented to time, place, history taking. Follows all commands speech and language fluent Cranial nerve II-XII: Pupils were equal round reactive to light. Extraocular movements were full, visual field were full on confrontational test. Facial sensation and strength were normal. Head turning and shoulder shrug  were normal and symmetric. Motor: The motor testing reveals 5 over 5 strength of all 4 extremities. Good symmetric motor tone is noted throughout.  Gait and station: Gait is normal.  DIAGNOSTIC DATA (LABS,  IMAGING, TESTING) - I reviewed patient records, labs, notes, testing and imaging myself where available.  No flowsheet data found.   Lab Results  Component Value Date   WBC 7.6 06/22/2019   HGB 12.6 06/22/2019   HCT 36.5 06/22/2019   MCV 88.2 06/22/2019   PLT 266.0 06/22/2019      Component Value Date/Time   NA 140 07/06/2019 0758   NA 141 06/01/2019 0736   K 3.1 (L) 07/06/2019 0758   CL 101 07/06/2019 0758   CO2 28 07/06/2019 0758   GLUCOSE 102 (H) 07/06/2019 0758   BUN 16 07/06/2019 0758   BUN 12 06/01/2019 0736   CREATININE 1.05 07/06/2019 0758   CREATININE 0.85 05/10/2016 1101   CALCIUM 9.2 07/06/2019 0758   PROT 7.1 07/06/2019 0758   PROT 6.6 12/22/2017 0934   ALBUMIN 4.4 07/06/2019 0758   ALBUMIN 4.5 12/22/2017 0934   AST 33 07/06/2019 0758   ALT 31 07/06/2019 0758   ALKPHOS 81 07/06/2019 0758   BILITOT 0.6 07/06/2019 0758   BILITOT 0.5 12/22/2017 0934   GFRNONAA 65 06/01/2019 0736   GFRNONAA 81 05/10/2016 1101   GFRAA 74 06/01/2019 0736   GFRAA >89 05/10/2016 1101   Lab Results  Component Value Date   CHOL 133 06/22/2019   HDL 44.90 06/22/2019   LDLCALC 65 06/22/2019   TRIG 116.0 06/22/2019   CHOLHDL 3 06/22/2019   Lab Results  Component Value Date   HGBA1C 5.9 07/06/2019   Lab Results  Component Value Date   VITAMINB12 318 01/03/2013   Lab Results  Component Value Date   TSH 0.99 06/22/2019       ASSESSMENT AND PLAN 55 y.o. year old female  has a past medical history of Allergy, Diverticulitis, GERD (gastroesophageal reflux disease) (1993), Hypertension (1993), Sleep apnea, and Vitamin D deficiency (2011). here with     ICD-10-CM   1. Obstructive sleep apnea treated with continuous positive airway pressure (CPAP)  G47.33 For home use only DME continuous positive airway pressure (CPAP)   Z99.89      She continues to do well with CPAP therapy.  Compliance report  reveals excellent compliance.  She is encouraged to continue using CPAP  nightly and for greater than 4 hours each night.  Supply orders have been placed for the next year. Well balanced diet and regular exercise encouraged. She will follow-up with me in 1 year, sooner if needed.  She verbalizes understanding and agreement with this plan.  Orders Placed This Encounter  Procedures  . For home use only DME continuous positive airway pressure (CPAP)    Supplies    Order Specific Question:   Length of Need    Answer:   Lifetime    Order Specific Question:   Patient has OSA or probable OSA    Answer:   Yes    Order Specific Question:   Is the patient currently using CPAP in the home    Answer:   Yes    Order Specific Question:   Settings    Answer:   Other see comments    Order Specific Question:   CPAP supplies needed    Answer:   Mask, headgear, cushions, filters, heated tubing and water chamber     No orders of the defined types were placed in this encounter.     I spent 25 minutes with the patient. 50% of this time was spent counseling and educating patient on plan of care and medications.    Debbora Presto, FNP-C 06/12/2020, 11:29 AM [facility] [address], [place], [place] [address] [phone]  I reviewed the above note and documentation by the Nurse Practitioner and agree with the history, exam, assessment and plan as outlined above. I was available for consultation. Star Age, MD, PhD [facility])

## 2020-06-12 NOTE — Patient Instructions (Signed)
Please continue using your CPAP regularly. While your insurance requires that you use CPAP at least 4 hours each night on 70% of the nights, I recommend, that you not skip any nights and use it throughout the night if you can. Getting used to CPAP and staying with the treatment long term does take time and patience and discipline. Untreated obstructive sleep apnea when it is moderate to severe can have an adverse impact on cardiovascular health and raise her risk for heart disease, arrhythmias, hypertension, congestive heart failure, stroke and diabetes. Untreated obstructive sleep apnea causes sleep disruption, nonrestorative sleep, and sleep deprivation. This can have an impact on your day to day functioning and cause daytime sleepiness and impairment of cognitive function, memory loss, mood disturbance, and problems focussing. Using CPAP regularly can improve these symptoms.   Follow up in 1 year   Sleep Apnea Sleep apnea affects breathing during sleep. It causes breathing to stop for a short time or to become shallow. It can also increase the risk of:  Heart attack.  Stroke.  Being very overweight (obese).  Diabetes.  Heart failure.  Irregular heartbeat. The goal of treatment is to help you breathe normally again. What are the causes? There are three kinds of sleep apnea:  Obstructive sleep apnea. This is caused by a blocked or collapsed airway.  Central sleep apnea. This happens when the brain does not send the right signals to the muscles that control breathing.  Mixed sleep apnea. This is a combination of obstructive and central sleep apnea. The most common cause of this condition is a collapsed or blocked airway. This can happen if:  Your throat muscles are too relaxed.  Your tongue and tonsils are too large.  You are overweight.  Your airway is too small.   What increases the risk?  Being overweight.  Smoking.  Having a small airway.  Being older.  Being  female.  Drinking alcohol.  Taking medicines to calm yourself (sedatives or tranquilizers).  Having family members with the condition. What are the signs or symptoms?  Trouble staying asleep.  Being sleepy or tired during the day.  Getting angry a lot.  Loud snoring.  Headaches in the morning.  Not being able to focus your mind (concentrate).  Forgetting things.  Less interest in sex.  Mood swings.  Personality changes.  Feelings of sadness (depression).  Waking up a lot during the night to pee (urinate).  Dry mouth.  Sore throat. How is this diagnosed?  Your medical history.  A physical exam.  A test that is done when you are sleeping (sleep study). The test is most often done in a sleep lab but may also be done at home. How is this treated?  Sleeping on your side.  Using a medicine to get rid of mucus in your nose (decongestant).  Avoiding the use of alcohol, medicines to help you relax, or certain pain medicines (narcotics).  Losing weight, if needed.  Changing your diet.  Not smoking.  Using a machine to open your airway while you sleep, such as: ? An oral appliance. This is a mouthpiece that shifts your lower jaw forward. ? A CPAP device. This device blows air through a mask when you breathe out (exhale). ? An EPAP device. This has valves that you put in each nostril. ? A BPAP device. This device blows air through a mask when you breathe in (inhale) and breathe out.  Having surgery if other treatments do not work. It   is important to get treatment for sleep apnea. Without treatment, it can lead to:  High blood pressure.  Coronary artery disease.  In men, not being able to have an erection (impotence).  Reduced thinking ability.   Follow these instructions at home: Lifestyle  Make changes that your doctor recommends.  Eat a healthy diet.  Lose weight if needed.  Avoid alcohol, medicines to help you relax, and some pain  medicines.  Do not use any products that contain nicotine or tobacco, such as cigarettes, e-cigarettes, and chewing tobacco. If you need help quitting, ask your doctor. General instructions  Take over-the-counter and prescription medicines only as told by your doctor.  If you were given a machine to use while you sleep, use it only as told by your doctor.  If you are having surgery, make sure to tell your doctor you have sleep apnea. You may need to bring your device with you.  Keep all follow-up visits as told by your doctor. This is important. Contact a doctor if:  The machine that you were given to use during sleep bothers you or does not seem to be working.  You do not get better.  You get worse. Get help right away if:  Your chest hurts.  You have trouble breathing in enough air.  You have an uncomfortable feeling in your back, arms, or stomach.  You have trouble talking.  One side of your body feels weak.  A part of your face is hanging down. These symptoms may be an emergency. Do not wait to see if the symptoms will go away. Get medical help right away. Call your local emergency services (911 in the [place] Do not drive yourself to the hospital. Summary  This condition affects breathing during sleep.  The most common cause is a collapsed or blocked airway.  The goal of treatment is to help you breathe normally while you sleep. This information is not intended to replace advice given to you by your health care provider. Make sure you discuss any questions you have with your health care provider. Document Revised: 12/30/2017 Document Reviewed: 11/08/2017 Elsevier Patient Education  2021 Elsevier Inc.  

## 2020-06-16 DIAGNOSIS — J302 Other seasonal allergic rhinitis: Secondary | ICD-10-CM | POA: Diagnosis not present

## 2020-06-16 DIAGNOSIS — R059 Cough, unspecified: Secondary | ICD-10-CM | POA: Diagnosis not present

## 2020-06-16 DIAGNOSIS — J0100 Acute maxillary sinusitis, unspecified: Secondary | ICD-10-CM | POA: Diagnosis not present

## 2020-07-01 ENCOUNTER — Ambulatory Visit (INDEPENDENT_AMBULATORY_CARE_PROVIDER_SITE_OTHER): Payer: BC Managed Care – PPO | Admitting: Family

## 2020-07-01 ENCOUNTER — Ambulatory Visit (INDEPENDENT_AMBULATORY_CARE_PROVIDER_SITE_OTHER): Payer: BC Managed Care – PPO

## 2020-07-01 ENCOUNTER — Encounter: Payer: Self-pay | Admitting: Family

## 2020-07-01 ENCOUNTER — Other Ambulatory Visit: Payer: Self-pay

## 2020-07-01 VITALS — BP 152/80 | HR 77 | Temp 98.5°F | Ht 62.0 in | Wt 246.4 lb

## 2020-07-01 DIAGNOSIS — R053 Chronic cough: Secondary | ICD-10-CM

## 2020-07-01 DIAGNOSIS — Z13220 Encounter for screening for lipoid disorders: Secondary | ICD-10-CM | POA: Diagnosis not present

## 2020-07-01 DIAGNOSIS — R059 Cough, unspecified: Secondary | ICD-10-CM | POA: Diagnosis not present

## 2020-07-01 DIAGNOSIS — R7303 Prediabetes: Secondary | ICD-10-CM | POA: Diagnosis not present

## 2020-07-01 DIAGNOSIS — R1010 Upper abdominal pain, unspecified: Secondary | ICD-10-CM

## 2020-07-01 DIAGNOSIS — Z0000 Encounter for general adult medical examination without abnormal findings: Secondary | ICD-10-CM

## 2020-07-01 LAB — CBC WITH DIFFERENTIAL/PLATELET
Basophils Absolute: 0.1 10*3/uL (ref 0.0–0.1)
Basophils Relative: 0.8 % (ref 0.0–3.0)
Eosinophils Absolute: 0.2 10*3/uL (ref 0.0–0.7)
Eosinophils Relative: 2.3 % (ref 0.0–5.0)
HCT: 37.8 % (ref 36.0–46.0)
Hemoglobin: 12.8 g/dL (ref 12.0–15.0)
Lymphocytes Relative: 34.1 % (ref 12.0–46.0)
Lymphs Abs: 2.7 10*3/uL (ref 0.7–4.0)
MCHC: 33.8 g/dL (ref 30.0–36.0)
MCV: 87.9 fl (ref 78.0–100.0)
Monocytes Absolute: 0.4 10*3/uL (ref 0.1–1.0)
Monocytes Relative: 5.5 % (ref 3.0–12.0)
Neutro Abs: 4.6 10*3/uL (ref 1.4–7.7)
Neutrophils Relative %: 57.3 % (ref 43.0–77.0)
Platelets: 290 10*3/uL (ref 150.0–400.0)
RBC: 4.3 Mil/uL (ref 3.87–5.11)
RDW: 14.8 % (ref 11.5–15.5)
WBC: 8 10*3/uL (ref 4.0–10.5)

## 2020-07-01 LAB — COMPREHENSIVE METABOLIC PANEL
ALT: 20 U/L (ref 0–35)
AST: 18 U/L (ref 0–37)
Albumin: 4.4 g/dL (ref 3.5–5.2)
Alkaline Phosphatase: 58 U/L (ref 39–117)
BUN: 11 mg/dL (ref 6–23)
CO2: 30 mEq/L (ref 19–32)
Calcium: 9.4 mg/dL (ref 8.4–10.5)
Chloride: 104 mEq/L (ref 96–112)
Creatinine, Ser: 0.85 mg/dL (ref 0.40–1.20)
GFR: 78.68 mL/min (ref >60.00)
Glucose, Bld: 95 mg/dL (ref 70–99)
Potassium: 3.8 mEq/L (ref 3.5–5.1)
Sodium: 142 mEq/L (ref 135–145)
Total Bilirubin: 0.4 mg/dL (ref 0.2–1.2)
Total Protein: 7 g/dL (ref 6.0–8.3)

## 2020-07-01 LAB — LIPID PANEL
Cholesterol: 139 mg/dL (ref 0–200)
HDL: 47.6 mg/dL (ref >39.00)
LDL Cholesterol: 67 mg/dL (ref 0–99)
NonHDL: 91.83
Total CHOL/HDL Ratio: 3
Triglycerides: 126 mg/dL (ref 0.0–149.0)
VLDL: 25.2 mg/dL (ref 0.0–40.0)

## 2020-07-01 LAB — HEMOGLOBIN A1C: Hgb A1c MFr Bld: 6.1 % (ref 4.6–6.5)

## 2020-07-01 LAB — TSH: TSH: 1.8 u[IU]/mL (ref 0.35–4.50)

## 2020-07-01 NOTE — Patient Instructions (Signed)
Increase your Prilosec to twice a day as we discussed;

## 2020-07-01 NOTE — Progress Notes (Signed)
Amanda [name] is a 55 y.o. female with the following history as recorded in EpicCare:  Patient Active Problem List   Diagnosis Date Noted  . Obstructive sleep apnea treated with continuous positive airway pressure (CPAP) 06/01/2018  . Pre-diabetes 06/01/2018  . Hyperlipidemia with target LDL less than 100 01/06/2018  . Stress incontinence of urine 06/26/2017  . Encounter for general adult medical examination with abnormal findings 05/20/2017  . Non-seasonal allergic rhinitis 04/14/2017  . Moderate obstructive sleep apnea 05/20/2016  . Recurrent sinus infections 06/12/2014  . Chest pain 01/03/2014  . GERD (gastroesophageal reflux disease) 03/08/2012  . Benign essential hypertension 03/08/2012    Current Outpatient Medications  Medication Sig Dispense Refill  . aspirin EC 81 MG tablet Take 81 mg by mouth daily. Swallow whole.    . blood glucose meter kit and supplies KIT Dispense based on patient and insurance preference. Use up to four times daily as directed. (FOR ICD-9 250.00, 250.01). 1 each 0  . cetirizine (ZYRTEC) 10 MG tablet TAKE 1 TABLET DAILY 90 tablet 3  . cholecalciferol (VITAMIN D3) 25 MCG (1000 UNIT) tablet Take 1,000 Units by mouth daily.    Marland Kitchen losartan (COZAAR) 100 MG tablet TAKE 1 TABLET DAILY 90 tablet 3  . Multiple Vitamins-Minerals (PRESERVISION AREDS 2) CAPS Take by mouth.    Marland Kitchen omeprazole (PRILOSEC) 20 MG capsule TAKE 1 CAPSULE DAILY 90 capsule 3  . ONETOUCH VERIO test strip USE UP TO FOUR TIMES A DAY AS DIRECTED 100 strip 13  . rosuvastatin (CRESTOR) 10 MG tablet TAKE 1 TABLET DAILY 90 tablet 3   No current facility-administered medications for this visit.    Allergies: Patient has no known allergies.  Past Medical History:  Diagnosis Date  . Allergy   . Diverticulitis   . GERD (gastroesophageal reflux disease) 1993  . Hypertension 1993  . Sleep apnea   . Vitamin D deficiency 2011    Past Surgical History:  Procedure Laterality Date  . ABDOMINAL  HYSTERECTOMY    . CHOLECYSTECTOMY    . CHOLECYSTECTOMY, LAPAROSCOPIC    . TUBAL LIGATION      Family History  Problem Relation Age of Onset  . Hypertension Mother   . Lupus Mother   . Hypertension Father   . Diabetes Father   . Heart disease Father   . Prostate cancer Father   . Asthma Brother   . Asthma Sister   . Cancer Sister        breast cancer   . Breast cancer Sister 67  . Diabetes Sister   . Diabetes Brother   . Hypertension Sister   . Hypertension Brother     Social History   Tobacco Use  . Smoking status: Never Smoker  . Smokeless tobacco: Never Used  Substance Use Topics  . Alcohol use: No    Subjective:  Presents for yearly CPE; notes she has been having some abdominal pain since January 2022- seemed to start after being diagnosed with COVID earlier this year; notes that pain is localized at site of gallbladder surgery; no burping or belching;  Does see cardiology and neurology; seeing eye doctor ( diagnosis of macular degeneration) and dentist regularly;  S/p total hysterectomy; ( ovaries are present);   Notes that blood pressure is elevated today because she has not taken her medication today;  Also mentions that she has been having increased problems with chronic cough since March 2022- seemed to start after diagnosed with COVID; has been to [facility] twice;  notes that cough more problematic in the morning when she wakes up;   Review of Systems  Constitutional: Negative.   HENT: Negative.   Eyes: Negative.   Respiratory: Negative.   Cardiovascular: Negative.   Gastrointestinal: Positive for abdominal pain.  Genitourinary: Negative.   Musculoskeletal: Negative.   Skin: Negative.   Neurological: Negative.   Endo/Heme/Allergies: Negative.   Psychiatric/Behavioral: Negative.       Objective:  Vitals:   07/01/20 0908  BP: (!) 152/80  Pulse: 77  Temp: 98.5 F (36.9 C)  TempSrc: Oral  SpO2: 98%  Weight: 246 lb 6.4 oz (111.8 kg)  Height: '5\' 2"'  (1.575  m)    General: Well developed, well nourished, in no acute distress  Skin : Warm and dry.  Head: Normocephalic and atraumatic  Eyes: Sclera and conjunctiva clear; pupils round and reactive to light; extraocular movements intact  Ears: External normal; canals clear; tympanic membranes normal  Oropharynx: Pink, supple. No suspicious lesions  Neck: Supple without thyromegaly, adenopathy  Lungs: Respirations unlabored; clear to auscultation bilaterally without wheeze, rales, rhonchi  CVS exam: normal rate and regular rhythm.  Abdomen: Soft; nontender; nondistended; normoactive bowel sounds; no masses or hepatosplenomegaly  Musculoskeletal: No deformities; no active joint inflammation  Extremities: No edema, cyanosis, clubbing  Vessels: Symmetric bilaterally  Neurologic: Alert and oriented; speech intact; face symmetrical; moves all extremities well; CNII-XII intact without focal deficit   Assessment:  1. PE (physical exam), annual   2. Lipid screening   3. Pre-diabetes   4. Chronic cough   5. Pain of upper abdomen     Plan:  Age appropriate preventive healthcare needs addressed; encouraged regular eye doctor and dental exams; encouraged regular exercise; will update labs and refills as needed today; follow-up to be determined;  Will update CXR today; will have patient increase Omeprazole to 20 mg bid; to consider changing to Protonix 40 mg; ? Allergy component- patient does not like nasal sprays; continue Zyrtec for now;  ? Hernia at site of gallbladder surgery; check abd CT for possible hernia; follow up to be determined;   This visit occurred during the SARS-CoV-2 public health emergency.  Safety protocols were in place, including screening questions prior to the visit, additional usage of staff PPE, and extensive cleaning of exam room while observing appropriate contact time as indicated for disinfecting solutions.      No follow-ups on file.  Orders Placed This Encounter   Procedures  . DG Chest 2 View    Standing Status:   Future    Number of Occurrences:   1    Standing Expiration Date:   07/01/2021    Order Specific Question:   Reason for Exam (SYMPTOM  OR DIAGNOSIS REQUIRED)    Answer:   chronic cough    Order Specific Question:   Is patient pregnant?    Answer:   No    Order Specific Question:   Preferred imaging location?    Answer:   [facility]  . CT ABDOMEN W CONTRAST    Standing Status:   Future    Standing Expiration Date:   07/01/2021    Order Specific Question:   If indicated for the ordered procedure, I authorize the administration of contrast media per Radiology protocol    Answer:   Yes    Order Specific Question:   Is patient pregnant?    Answer:   No    Order Specific Question:   Preferred imaging location?  Answer:   [facility]    Order Specific Question:   Is Oral Contrast requested for this exam?    Answer:   Yes, Per Radiology protocol  . CBC with Differential/Platelet    Standing Status:   Future    Number of Occurrences:   1    Standing Expiration Date:   07/01/2021  . Comp Met (CMET)    Standing Status:   Future    Number of Occurrences:   1    Standing Expiration Date:   07/01/2021  . Lipid panel    Standing Status:   Future    Number of Occurrences:   1    Standing Expiration Date:   07/01/2021  . TSH    Standing Status:   Future    Number of Occurrences:   1    Standing Expiration Date:   07/01/2021  . Hemoglobin A1c    Standing Status:   Future    Number of Occurrences:   1    Standing Expiration Date:   07/01/2021    Requested Prescriptions    No prescriptions requested or ordered in this encounter

## 2020-07-05 ENCOUNTER — Other Ambulatory Visit: Payer: Self-pay

## 2020-07-08 ENCOUNTER — Other Ambulatory Visit: Payer: Self-pay | Admitting: Family

## 2020-07-08 DIAGNOSIS — R109 Unspecified abdominal pain: Secondary | ICD-10-CM

## 2020-07-08 NOTE — Telephone Encounter (Signed)
I have called the pt and notified her of provider message below.

## 2020-07-08 NOTE — Telephone Encounter (Signed)
Please let her know that her insurance company will not approve the abdominal CT until we first do an abdominal ultrasound. We will get the ultrasound ordered and they will call her to schedule. My referral coordinator will cancel the CT for now.

## 2020-07-08 NOTE — Telephone Encounter (Signed)
-----   Message from Lysle Rubens sent at 07/07/2020 12:04 PM EDT ----- Graciella Belton tell her or should I? ----- Message ----- From: Olive Bass, FNP Sent: 07/07/2020  11:41 AM EDT To: Lysle Rubens  Sorry- I held it since you weren't here. I have never won a peer to peer on something like this. I will just change to the [place] I am sorry to inconvenience her but we will have to jump through their hoops.  ----- Message ----- From: Reuel Boom D Sent: 07/07/2020  11:09 AM EDT To: Olive Bass, FNP  Dawayne Cirri,  Just following up on this message I sent you last week.  Thanks Lawson Fiscal ----- Message ----- From: Reuel Boom D Sent: 07/02/2020   4:17 PM EDT To: Olive Bass, FNP  Soooo..... the insurance co wants to do a peer to peer for pt's CT Abd -   They state the guidelines are the pt needs an US done before they can do a CT -   I had them transfer me to a clinical nurse and I tried to explain to them but they still said you would need to do the peer to peer or do an [place] first.   If you want to do a peer to peer the number [phone]. I asked and you will need to go through the prompts to get the the medical reviewer.   Just got on the website and they gave me number [phone] to call for peer to peer. I'm sure either one will do.  Sorry :( Lawson Fiscal   Also, I'll be out of the office on Thurs or Friday - so maybe have Peterson Ao talk with pt with your decision.   Thanks :) Lawson Fiscal

## 2020-07-08 NOTE — Telephone Encounter (Signed)
Attempted to call pt to relay providers message below. No answer and will try again.

## 2020-07-15 ENCOUNTER — Other Ambulatory Visit: Payer: BC Managed Care – PPO

## 2020-07-24 ENCOUNTER — Ambulatory Visit
Admission: RE | Admit: 2020-07-24 | Discharge: 2020-07-24 | Disposition: A | Discharge status: Home / Self Care | Payer: BC Managed Care – PPO | Source: Ambulatory Visit | Attending: Family | Admitting: Family

## 2020-07-24 DIAGNOSIS — K7689 Other specified diseases of liver: Secondary | ICD-10-CM | POA: Diagnosis not present

## 2020-07-24 DIAGNOSIS — R109 Unspecified abdominal pain: Secondary | ICD-10-CM | POA: Diagnosis not present

## 2020-07-24 DIAGNOSIS — R188 Other ascites: Secondary | ICD-10-CM | POA: Diagnosis not present

## 2020-07-24 DIAGNOSIS — Z9049 Acquired absence of other specified parts of digestive tract: Secondary | ICD-10-CM | POA: Diagnosis not present

## 2020-07-28 ENCOUNTER — Other Ambulatory Visit: Payer: Self-pay | Admitting: Family

## 2020-07-28 DIAGNOSIS — R109 Unspecified abdominal pain: Secondary | ICD-10-CM

## 2020-08-11 ENCOUNTER — Encounter: Payer: Self-pay | Admitting: Family

## 2020-08-12 ENCOUNTER — Telehealth: Payer: Self-pay | Admitting: Family

## 2020-08-12 ENCOUNTER — Other Ambulatory Visit: Payer: BC Managed Care – PPO

## 2020-08-12 DIAGNOSIS — G4733 Obstructive sleep apnea (adult) (pediatric): Secondary | ICD-10-CM | POA: Diagnosis not present

## 2020-08-12 NOTE — Telephone Encounter (Signed)
Will you please fax the U/S report and my OV note ( both are on your desk) to fax # [phone]. Please put Reference # [id number] on the cover sheet and papers as well. This should help get this CT approved per the person I spoke with at [facility].  Thanks-

## 2020-08-13 NOTE — Telephone Encounter (Signed)
I have finally received a confirmation that the fax was sent.

## 2020-08-13 NOTE — Telephone Encounter (Signed)
I have received the paperwork and faxed them to [facility] at [phone] with the reference number [id number]

## 2020-08-18 NOTE — Telephone Encounter (Signed)
I have called [employer] at [phone] to find out where the status of the CT and they stated that we did not submit the information in time so they denied the Authorization. I have submitted for the appeal and they stated that it can take up to 30 days for a determination.   FYI to provider and [facility]

## 2020-08-19 NOTE — Telephone Encounter (Signed)
I have called the pt to notify her of what has been going on. We have submitted the appeal and now waiting on the response. We have already submitted the additional information that they needed. There was no answer so I left this information on the identified VM and to give [place] a call back if she had any additional questions. Pt is also aware of the national shortage of the Contrast dye that is needed as well.

## 2020-08-22 ENCOUNTER — Other Ambulatory Visit: Payer: Self-pay | Admitting: Family

## 2020-08-22 DIAGNOSIS — K219 Gastro-esophageal reflux disease without esophagitis: Secondary | ICD-10-CM

## 2020-08-29 ENCOUNTER — Other Ambulatory Visit: Payer: Self-pay | Admitting: Family

## 2020-08-29 DIAGNOSIS — I10 Essential (primary) hypertension: Secondary | ICD-10-CM

## 2020-09-04 ENCOUNTER — Encounter: Payer: Self-pay | Admitting: Family

## 2020-09-09 ENCOUNTER — Ambulatory Visit
Admission: RE | Admit: 2020-09-09 | Discharge: 2020-09-09 | Disposition: A | Discharge status: Home / Self Care | Payer: BC Managed Care – PPO | Source: Ambulatory Visit | Attending: Family | Admitting: Family

## 2020-09-09 DIAGNOSIS — R109 Unspecified abdominal pain: Secondary | ICD-10-CM

## 2020-09-09 DIAGNOSIS — K5730 Diverticulosis of large intestine without perforation or abscess without bleeding: Secondary | ICD-10-CM | POA: Diagnosis not present

## 2020-09-09 DIAGNOSIS — K760 Fatty (change of) liver, not elsewhere classified: Secondary | ICD-10-CM | POA: Diagnosis not present

## 2020-09-09 MED ORDER — IOPAMIDOL (ISOVUE-300) INJECTION 61%
100.0000 mL | Freq: Once | INTRAVENOUS | Status: AC | PRN
  Administered 2020-09-09: 100 mL via INTRAVENOUS

## 2020-09-12 ENCOUNTER — Other Ambulatory Visit: Payer: Self-pay | Admitting: Family

## 2020-09-12 DIAGNOSIS — G4733 Obstructive sleep apnea (adult) (pediatric): Secondary | ICD-10-CM | POA: Diagnosis not present

## 2020-09-12 MED ORDER — PANTOPRAZOLE SODIUM 40 MG PO TBEC: 40.0000 mg | DELAYED_RELEASE_TABLET | Freq: Every day | ORAL | 0 refills | Status: DC

## 2020-10-12 DIAGNOSIS — G4733 Obstructive sleep apnea (adult) (pediatric): Secondary | ICD-10-CM | POA: Diagnosis not present

## 2020-11-21 DIAGNOSIS — G4733 Obstructive sleep apnea (adult) (pediatric): Secondary | ICD-10-CM | POA: Diagnosis not present

## 2020-12-06 ENCOUNTER — Other Ambulatory Visit: Payer: Self-pay | Admitting: Family

## 2021-02-20 DIAGNOSIS — G4733 Obstructive sleep apnea (adult) (pediatric): Secondary | ICD-10-CM | POA: Diagnosis not present

## 2021-02-21 DIAGNOSIS — G4733 Obstructive sleep apnea (adult) (pediatric): Secondary | ICD-10-CM | POA: Diagnosis not present

## 2021-03-22 DIAGNOSIS — G4733 Obstructive sleep apnea (adult) (pediatric): Secondary | ICD-10-CM | POA: Diagnosis not present

## 2021-03-30 ENCOUNTER — Other Ambulatory Visit: Payer: Self-pay | Admitting: Family

## 2021-04-22 DIAGNOSIS — G4733 Obstructive sleep apnea (adult) (pediatric): Secondary | ICD-10-CM | POA: Diagnosis not present

## 2021-06-02 DIAGNOSIS — H353131 Nonexudative age-related macular degeneration, bilateral, early dry stage: Secondary | ICD-10-CM | POA: Diagnosis not present

## 2021-06-02 DIAGNOSIS — H5203 Hypermetropia, bilateral: Secondary | ICD-10-CM | POA: Diagnosis not present

## 2021-06-02 DIAGNOSIS — E119 Type 2 diabetes mellitus without complications: Secondary | ICD-10-CM | POA: Diagnosis not present

## 2021-06-05 DIAGNOSIS — G4733 Obstructive sleep apnea (adult) (pediatric): Secondary | ICD-10-CM | POA: Diagnosis not present

## 2021-06-11 ENCOUNTER — Encounter: Payer: Self-pay | Admitting: Cardiovascular Disease

## 2021-06-11 ENCOUNTER — Other Ambulatory Visit: Payer: Self-pay

## 2021-06-11 ENCOUNTER — Ambulatory Visit: Payer: BC Managed Care – PPO | Admitting: Cardiovascular Disease

## 2021-06-11 VITALS — BP 132/82 | HR 94 | Ht 62.0 in | Wt 255.6 lb

## 2021-06-11 DIAGNOSIS — I2510 Atherosclerotic heart disease of native coronary artery without angina pectoris: Secondary | ICD-10-CM | POA: Diagnosis not present

## 2021-06-11 DIAGNOSIS — R079 Chest pain, unspecified: Secondary | ICD-10-CM

## 2021-06-11 DIAGNOSIS — E785 Hyperlipidemia, unspecified: Secondary | ICD-10-CM

## 2021-06-11 LAB — LIPID PANEL
Chol/HDL Ratio: 3 ratio (ref 0.0–4.4)
Cholesterol, Total: 144 mg/dL (ref 100–199)
HDL: 48 mg/dL (ref >39)
LDL Chol Calc (NIH): 73 mg/dL (ref 0–99)
Triglycerides: 132 mg/dL (ref 0–149)
VLDL Cholesterol Cal: 23 mg/dL (ref 5–40)

## 2021-06-11 LAB — ALT: ALT: 23 IU/L (ref 0–32)

## 2021-06-11 LAB — BASIC METABOLIC PANEL
BUN/Creatinine Ratio: 14 (ref 9–23)
BUN: 11 mg/dL (ref 6–24)
CO2: 25 mmol/L (ref 20–29)
Calcium: 9.6 mg/dL (ref 8.7–10.2)
Chloride: 104 mmol/L (ref 96–106)
Creatinine, Ser: 0.79 mg/dL (ref 0.57–1.00)
Glucose: 143 mg/dL — ABNORMAL HIGH (ref 70–99)
Potassium: 4.1 mmol/L (ref 3.5–5.2)
Sodium: 141 mmol/L (ref 134–144)
eGFR: 89 mL/min/{1.73_m2} (ref >59)

## 2021-06-11 MED ORDER — ISOSORBIDE MONONITRATE ER 30 MG PO TB24
30.0000 mg | ORAL_TABLET | Freq: Every day | ORAL | 3 refills | Status: DC
Start: 2021-06-11 — End: 2021-09-04

## 2021-06-11 MED ORDER — METOPROLOL TARTRATE 50 MG PO TABS
50.0000 mg | ORAL_TABLET | Freq: Once | ORAL | 0 refills | Status: DC
Start: 2021-06-11 — End: 2021-07-17

## 2021-06-11 MED ORDER — NITROGLYCERIN 0.4 MG SL SUBL
0.4000 mg | SUBLINGUAL_TABLET | SUBLINGUAL | 3 refills | Status: DC | PRN
Start: 2021-06-11 — End: 2023-12-16

## 2021-06-11 MED ORDER — METOPROLOL TARTRATE 25 MG PO TABS: 25.0000 mg | ORAL_TABLET | Freq: Two times a day (BID) | ORAL | 3 refills | Status: DC

## 2021-06-11 NOTE — Patient Instructions (Addendum)
Medication Instructions:  ?Your physician has recommended you make the following change in your medication:  ? ?1) START Metoprolol Tartrate 25mg  twice daily ?2) START Isosorbide Mononitrate 30mg  daily (start this 2-3 days after starting metoprolol) ?3) START Nitroglycerin 0.4mg  as needed for chest pain (if you take this medication call our office to let us know if it relieves your chest pain) ? ?*If you need a refill on your cardiac medications before your next appointment, please call your pharmacy* ? ? ?Lab Work: ?TODAY: Lipids, BMP, ALT ?If you have labs (blood work) drawn today and your tests are completely normal, you will receive your results only by: ?MyChart Message (if you have MyChart) OR ?A paper copy in the mail ?If you have any lab test that is abnormal or we need to change your treatment, we will call you to review the results. ? ? ?Testing/Procedures: ?Your physician has recommended you have a coronary CTA. ? ?Your physician has requested that you have an echocardiogram. Echocardiography is a painless test that uses sound waves to create images of your heart. It provides your doctor with information about the size and shape of your heart and how well your heart?s chambers and valves are working. This procedure takes approximately one hour. There are no restrictions for this procedure. ? ?Follow-Up: ?At [facility], you and your health needs are our priority.  As part of our continuing mission to provide you with exceptional heart care, we have created designated Provider Care Teams.  These Care Teams include your primary Cardiologist (physician) and Advanced Practice Providers (APPs -  Physician Assistants and Nurse Practitioners) who all work together to provide you with the care you need, when you need it. ? ?Your next appointment:   ?3-4 month(s) ? ?The format for your next appointment:   ?In Person ? ?Provider:   ?Mertie Moores, MD  ? ?Other Instructions ? ? ?Your cardiac CT will be scheduled  at one of the below locations:  ? ?[facility] ?[address] ?[place], [place] [address] ?[phone] ? ?At [facility], please arrive at the [facility] Entrance (Entrance C2) of [facility] 30 minutes prior to test start time. ?You can use the FREE valet parking offered at entrance C (encouraged to control the heart rate for the test)  ?Proceed to the [facility] Radiology Department (first floor) to check-in and test prep. ? ?All radiology patients and guests should use entrance C2 at [facility], accessed from [facility], even though the hospital's physical address listed is [address] ? ? ? ?Please follow these instructions carefully (unless otherwise directed): ? ?On the Night Before the Test: ?Be sure to Drink plenty of water. ?Do not consume any caffeinated/decaffeinated beverages or chocolate 12 hours prior to your test. ?Do not take any antihistamines 12 hours prior to your test. ? ?On the Day of the Test: ?Drink plenty of water until 1 hour prior to the test. ?Do not eat any food 4 hours prior to the test. ?You may take your regular medications prior to the test.  ?Take metoprolol (Lopressor) two hours prior to test. ?FEMALES- please wear underwire-free bra if available, avoid dresses & tight clothing ?     ?After the Test: ?Drink plenty of water. ?After receiving IV contrast, you may experience a mild flushed feeling. This is normal. ?On occasion, you may experience a mild rash up to 24 hours after the test. This is not dangerous. If  this occurs, you can take Benadryl 25 mg and increase your fluid intake. ?If you experience trouble breathing, this can be serious. If it is severe call 911 IMMEDIATELY. If it is mild, please call our office. ?If you take any of these medications: Glipizide/Metformin, Avandament, Glucavance, please do not take 48 hours after completing test unless otherwise instructed. ? ?We will call to schedule  your test 2-4 weeks out understanding that some insurance companies will need an authorization prior to the service being performed.  ? ?For non-scheduling related questions, please contact the cardiac imaging nurse navigator should you have any questions/concerns: ?Marchia Bond, Cardiac Imaging Nurse Navigator ?Gordy Clement, Cardiac Imaging Nurse Navigator ?[facility] ?Direct Office Dial: [phone]  ? ?For scheduling needs, including cancellations and rescheduling, please call [place], [phone]. ? ?

## 2021-06-11 NOTE — Progress Notes (Signed)
?Cardiology Office Note:   ? ?Date:  06/11/2021  ? ?ID:  CARITA SOLLARS, DOB Feb 18, 1966, MRN [id number] ? ?PCP:  Marrian Salvage, [place]  ?Cardiologist:   New to [facility]  ? ?Referring MD: Marrian Salvage,*  ? ?Chief Complaint  ?Patient presents with  ? Coronary Artery Disease  ?  HTN ? ? ?  ? Hypertension  ? ? ?  ? ?JANARA KLETT is a 56 y.o. female with a hx of hypertension.  We are asked to see her today for further evaluation of chest discomfort by Dr.  Gayla Medicus  ? ?Has had palpitations for the past 5 years.  Associated with near syncope ?Has occasional CP.    Wakes up with chest pain .   Early morning hours or middle of the night .  ?Not exertional . ?Does not get any regular exercise.   Just bought a treadmill .   Has not started using it yet  ? ?Is eating more salt than usual.   earting more fast foods.  ? Having to take care of her mom.  Her father is now in a memory care unit. ? ?Looking for assisted living care.    ? ?Sept. 26, , 2019 ?Laquana presented to me several months ago with some atypical chest pain.  We performed a coronary CT angiogram.  She was found to have mild coronary artery disease involving the left anterior descending artery.  Her coronary calcium score was 1. ? ?Feb. 12, 2021: ? ?Masie is seen today for a follow-up visit.  She is had some chest pain in the past.  Coronary CT angiogram reveals mild coronary artery disease involving the left anterior descending artery.  Coronary calcium score was 1. ?BP is a little elevated.    ?Still eating salt .   Is not exercising any longer  ? ?June 11, 2021:  ?Skylee is seen today for follow-up visit.  She has a history of hypertension and hyperlipidemia. ? ?Lipid levels from April, 2022 were reviewed.  Her total cholesterol is 139. ?HDL is 47. ?LDL is 67. ?Triglyceride level is 126. ? ?Has sudden episodes of generalized weakness, dyspnea , milgl chest pain  ?Has to lie down  ?Might last several min ?Feels better after a nap   ? ?Has OSA , uses her CPAP regularly  ? ?Previous echo in [place] 2016:   normal LV function , EF 55-60%. ?Grade 1 DD  ? ?Works 12 hours days,  really wears her out .  ? ?Past Medical History:  ?Diagnosis Date  ? Allergy   ? Diverticulitis   ? GERD (gastroesophageal reflux disease) 1993  ? Hypertension 1993  ? Sleep apnea   ? Vitamin D deficiency 2011  ? ? ?Past Surgical History:  ?Procedure Laterality Date  ? ABDOMINAL HYSTERECTOMY    ? CHOLECYSTECTOMY    ? CHOLECYSTECTOMY, LAPAROSCOPIC    ? TUBAL LIGATION    ? ? ?Current Medications: ?Current Meds  ?Medication Sig  ? aspirin EC 81 MG tablet Take 81 mg by mouth daily. Swallow whole.  ? blood glucose meter kit and supplies KIT Dispense based on patient and insurance preference. Use up to four times daily as directed. (FOR ICD-9 250.00, 250.01).  ? cetirizine (ZYRTEC) 10 MG tablet TAKE 1 TABLET DAILY  ? cholecalciferol (VITAMIN D3) 25 MCG (1000 UNIT) tablet Take 1,000 Units by mouth daily.  ? isosorbide mononitrate (IMDUR) 30 MG 24 hr tablet Take 1 tablet (30 mg total) by mouth daily.  ?  losartan (COZAAR) 100 MG tablet TAKE 1 TABLET DAILY  ? metoprolol tartrate (LOPRESSOR) 25 MG tablet Take 1 tablet (25 mg total) by mouth 2 (two) times daily.  ? metoprolol tartrate (LOPRESSOR) 50 MG tablet Take 1 tablet (50 mg total) by mouth once for 1 dose. Take 90-120 minutes prior to scan.  ? Multiple Vitamins-Minerals (PRESERVISION AREDS 2) CAPS Take by mouth.  ? nitroGLYCERIN (NITROSTAT) 0.4 MG SL tablet Place 1 tablet (0.4 mg total) under the tongue every 5 (five) minutes as needed for chest pain.  ? ONETOUCH VERIO test strip USE UP TO FOUR TIMES A DAY AS DIRECTED  ? pantoprazole (PROTONIX) 40 MG tablet TAKE 1 TABLET DAILY  ? rosuvastatin (CRESTOR) 10 MG tablet TAKE 1 TABLET DAILY  ?  ? ?Allergies:   Patient has no known allergies.  ? ?Social History  ? ?Socioeconomic History  ? Marital status: Widowed  ?  Spouse name: Not on file  ? Number of children: 2  ? Years of education:  GED  ? Highest education level: Not on file  ?Occupational History  ? Occupation: Glass blower/designer  ?Tobacco Use  ? Smoking status: Never  ? Smokeless tobacco: Never  ?Substance and Sexual Activity  ? Alcohol use: No  ? Drug use: No  ? Sexual activity: Not on file  ?Other Topics Concern  ? Not on file  ?Social History Narrative  ? Lives in [place] with her sister. She works at [employer] living.   ? Drinks about 2 caffeine drinks a week   ? ?Social Determinants of Health  ? ?Financial Resource Strain: Not on file  ?Food Insecurity: Not on file  ?Transportation Needs: Not on file  ?Physical Activity: Not on file  ?Stress: Not on file  ?Social Connections: Not on file  ?  ? ?Family History: ?The patient's family history includes Asthma in her brother and sister; Breast cancer (age of onset: 65) in her sister; Cancer in her sister; Diabetes in her brother, father, and sister; Heart disease in her father; Hypertension in her brother, father, mother, and sister; Lupus in her mother; Prostate cancer in her father. ? ?ROS:   ?Please see the history of present illness.    ? All other systems reviewed and are negative. ? ?EKGs/Labs/Other Studies Reviewed:   ? ?The following studies were reviewed today: ? ? ? ? ?Recent Labs: ?07/01/2020: ALT 20; BUN 11; Creatinine, Ser 0.85; Hemoglobin 12.8; Platelets 290.0; Potassium 3.8; Sodium 142; TSH 1.80  ?Recent Lipid Panel ?   ?Component Value Date/Time  ? CHOL 139 07/01/2020 0950  ? CHOL 146 12/22/2017 0934  ? TRIG 126.0 07/01/2020 0950  ? HDL 47.60 07/01/2020 0950  ? HDL 49 12/22/2017 0934  ? CHOLHDL 3 07/01/2020 0950  ? VLDL 25.2 07/01/2020 0950  ? [place] 67 07/01/2020 0950  ? [place] 79 12/22/2017 0934  ? ?Physical Exam: ?Blood pressure 132/82, pulse 94, height '5\' 2"'  (1.575 m), weight 255 lb 9.6 oz (115.9 kg), SpO2 99 %. ? ?GEN:  middle age female,   moderately obese , in no acute distress ?HEENT: Normal ?NECK: No JVD; No carotid bruits ?LYMPHATICS: No  lymphadenopathy ?CARDIAC: RRR , 2/6 systolic murmur at LSB radiates to the mid axillary line  ?RESPIRATORY:  Clear to auscultation without rales, wheezing or rhonchi  ?ABDOMEN: Soft, non-tender, non-distended ?MUSCULOSKELETAL:  No edema; No deformity  ?SKIN: Warm and dry ?NEUROLOGIC:  Alert and oriented x 3 ? ? ?EKG: June 11, 2021: Normal sinus rhythm at 94.  Nonspecific ST and T wave changes in the lateral leads.  These ST / T wave changs are new compared to previous ECG  ? ?ASSESSMENT:   ? ?1. Chest pain, unspecified type   ?2. Coronary artery disease involving native coronary artery of native heart without angina pectoris   ?3. Hyperlipidemia with target LDL less than 100   ? ? ?PLAN:   ? ? ?Chest pain:      She has had episodes of chest discomfort.  These are also associated with a feeling of profound fatigue and shortness of breath.  She had an unremarkable coronary CTA 4 years ago.  We will repeat the coronary CTA for further evaluation. ? ?We will start metoprolol 25 mg twice a day because she is tachycardic at baseline.  We will also add isosorbide 30 mg a day to start 2 days after she starts the metoprolol.  I will also give her nitroglycerin to take on an as-needed basis. ? ?2.  Hypertension:      Blood pressure is overall fairly well controlled. ? ?3.  Hyperlipidemia:    check labs today  ? ? ? ?Medication Adjustments/Labs and Tests Ordered: ?Current medicines are reviewed at length with the patient today.  Concerns regarding medicines are outlined above.  ?Orders Placed This Encounter  ?Procedures  ? CT CORONARY MORPH W/CTA COR W/SCORE W/CA W/CM &/OR WO/CM  ? Lipid panel  ? ALT  ? Basic metabolic panel  ? EKG 12-Lead  ? ECHOCARDIOGRAM COMPLETE  ? ?Meds ordered this encounter  ?Medications  ? metoprolol tartrate (LOPRESSOR) 50 MG tablet  ?  Sig: Take 1 tablet (50 mg total) by mouth once for 1 dose. Take 90-120 minutes prior to scan.  ?  Dispense:  1 tablet  ?  Refill:  0  ? metoprolol tartrate  (LOPRESSOR) 25 MG tablet  ?  Sig: Take 1 tablet (25 mg total) by mouth 2 (two) times daily.  ?  Dispense:  180 tablet  ?  Refill:  3  ? nitroGLYCERIN (NITROSTAT) 0.4 MG SL tablet  ?  Sig: Place 1 tablet (0.4 mg total) under the

## 2021-06-11 NOTE — Patient Instructions (Addendum)
Please continue using your CPAP regularly. While your insurance requires that you use CPAP at least 4 hours each night on 70% of the nights, I recommend, that you not skip any nights and use it throughout the night if you can. Getting used to CPAP and staying with the treatment long term does take time and patience and discipline. Untreated obstructive sleep apnea when it is moderate to severe can have an adverse impact on cardiovascular health and raise her risk for heart disease, arrhythmias, hypertension, congestive heart failure, stroke and diabetes. Untreated obstructive sleep apnea causes sleep disruption, nonrestorative sleep, and sleep deprivation. This can have an impact on your day to day functioning and cause daytime sleepiness and impairment of cognitive function, memory loss, mood disturbance, and problems focussing. Using CPAP regularly can improve these symptoms. ? ? ?Continue close follow up with care team. I will send in new supply orders for the next year.  ? ?Follow up with me in 1 year  ?

## 2021-06-11 NOTE — Progress Notes (Signed)
? ? ?PATIENT: Amanda [name] ?DOB: 1966-02-22 ? ?REASON FOR VISIT: follow up ?HISTORY FROM: patient ? ?Chief Complaint  ?Patient presents with  ? Obstructive Sleep Apnea  ?  Rm 16, alone. Here for yearly CPAP f/u. Pt reports doing well. No issues or concerns today. Pt reports having heart murmur and a leaky valve, being seen by cardiologist and has upcoming echo on 3/28 and 3/29 has a CT scan.   ?  ? ?HISTORY OF PRESENT ILLNESS: ? ?06/15/21 ALL:  ?Amanda [name] returns for follow up for OSA on CPAP. She continues to do well on therapy. She is using her machine every night. She has been a little more tired and sleepy during the day. She was recently evaluated by cardiology for concerns of chest pain and shob. Dr Cathie Olden noted a murmur and has scheduled ECHO and CT. She was started on metoprolol 69m BID and Imdur 347mdaily. She has follow up with PCP next week.  ? ? ? ?06/12/2020 ALL:  ?She returns for follow up for OSA on CPAP. She is doing very well. She was diagnosed with Covid in 03/2020 and treated for PNA 05/26/2020. She had a period of about 3-4 days where she could not use her CPAP due to coughing and felt that she did not sleep well at all. She does report feeling significantly better on CPAP therapy. No concerns with machine or supplies.  ? ? ? ? ? ?06/13/2019 ALL:  ?Amanda [name] a [age].o. female here today for follow up for OSA on CPAP therapy. Overall, she is doing well. She does endorse more fatigue and headaches recently. She wakes up at 3 days a week feeling exhausted. She reports having a mild headache about once weekly, frontal and throbbing. No migraine symptoms or vision changes. Headache resolves with Tylenol. BP has been slightly elevated. She was seen by cardiology and started on chlorthalidone 12.37m10maily and potassium supplements. She has also noted significant hair loss that started about 2 years ago. She had labs with PCP that were normal. She was told it was related to stress. She  had to place parents in nursing homes in the recent years. Her father died in in JunJun 13, 2019She is working 12 hour shifts at work. She tries to drink 3 bottles of water daily.  ? ?Compliance report dated 05/13/2019 through 06/11/2019 reveals that she has used CPAP 30 of the last 30 days for compliance of 100%.  She used CPAP greater than 4 hours all 30 days for compliance 100%.  Average usage was 7 hours and 45 minutes.  Residual AHI was 1.5 on 12 cm of water and an EPR of 3.  There were no significant leaks noted. ? ?HISTORY: (copied from [place]'s note on 06/01/2018) ? ?Amanda [name] a [age]ar old right-handed woman with an underlying medical history of hypertension, allergies, reflux disease, vitamin D deficiency, and morbid obesity, who presents for follow-up consultation of her sleep disorder, after home sleep study testing and trial of AutoPap therapy. The patient is unaccompanied today. I last saw her on 08/10/2016, at which time she reported doing much better after starting AutoPap therapy. She felt that her sleep quality and sleep consolidation as well as daytime energy were improved as well as her morning headaches. She was motivated to work on her weight loss and continue with AutoPap. I suggested we proceed with an overnight pulse oximetry test to monitor oxygen saturations for 1 night. She had a pulse ox  on 08/24/2016 with a total duration of 5 hours and 56 minutes, average oxygen saturation of 97.3%, nadir of 92%. ?  ?Today, 02/10/2017: I reviewed her AutoPap compliance data from 01/10/2017 through 02/08/2017 which is a total of 30 days, during which time she used her machine every night with percent used days greater than 4 hours at 90%, indicating excellent compliance with an average usage of 7 hours and 34 minutes, residual AHI at goal at 1.6 per hour, pressure for the 95th percentile right at 12 cm, leak very low, pressure setting of 4-13 cm with EPR. She reports doing well, will start  going to the gym. Had a bout of bronchitis, went to UC and had a Zpack. Could not use the machine then. Otherwise doing well. Would be okay trying CPAP of 12 cm, using nasal mask.  ? ?UPDATE 3/5/2020CM Amanda [name], 56 year old female returns for follow-up with history of obstructive sleep apnea here for CPAP compliance.  She is doing well with her CPAP.  She claims she has more sleepiness lately.  She was recently switched from an 8-hour shift to a 12-hour shift and she is still adjusting to this.  CPAP data dated 05/01/2018-05/30/2018 shows compliance greater than 4 hours at 83%.  Average usage 7 hours 19 minutes.  Set pressure 12 cm leak 95th percentile 0.9.  AHI 0.9.  The other 5 days she did not use the machine she had a cold.  She returns for reevaluation ? ? ?REVIEW OF SYSTEMS: Out of a complete 14 system review of symptoms, the patient complains only of the following symptoms, chest pain, shob, dizziness, and all other reviewed systems are negative. ? ? ?ESS: 13/24, previously 7/24 ? ? ?ALLERGIES: ?No Known Allergies ? ?HOME MEDICATIONS: ?Outpatient Medications Prior to Visit  ?Medication Sig Dispense Refill  ? aspirin EC 81 MG tablet Take 81 mg by mouth daily. Swallow whole.    ? blood glucose meter kit and supplies KIT Dispense based on patient and insurance preference. Use up to four times daily as directed. (FOR ICD-9 250.00, 250.01). 1 each 0  ? cetirizine (ZYRTEC) 10 MG tablet TAKE 1 TABLET DAILY 90 tablet 3  ? cholecalciferol (VITAMIN D3) 25 MCG (1000 UNIT) tablet Take 1,000 Units by mouth daily.    ? isosorbide mononitrate (IMDUR) 30 MG 24 hr tablet Take 1 tablet (30 mg total) by mouth daily. 90 tablet 3  ? losartan (COZAAR) 100 MG tablet TAKE 1 TABLET DAILY 90 tablet 3  ? metoprolol tartrate (LOPRESSOR) 25 MG tablet Take 1 tablet (25 mg total) by mouth 2 (two) times daily. 180 tablet 3  ? Multiple Vitamins-Minerals (PRESERVISION AREDS 2) CAPS Take by mouth.    ? nitroGLYCERIN (NITROSTAT) 0.4 MG SL  tablet Place 1 tablet (0.4 mg total) under the tongue every 5 (five) minutes as needed for chest pain. 25 tablet 3  ? ONETOUCH VERIO test strip USE UP TO FOUR TIMES A DAY AS DIRECTED 100 strip 13  ? pantoprazole (PROTONIX) 40 MG tablet TAKE 1 TABLET DAILY 90 tablet 3  ? rosuvastatin (CRESTOR) 10 MG tablet TAKE 1 TABLET DAILY 90 tablet 3  ? metoprolol tartrate (LOPRESSOR) 50 MG tablet Take 1 tablet (50 mg total) by mouth once for 1 dose. Take 90-120 minutes prior to scan. 1 tablet 0  ? ?No facility-administered medications prior to visit.  ? ? ?PAST MEDICAL HISTORY: ?Past Medical History:  ?Diagnosis Date  ? Allergy   ? Diverticulitis   ? GERD (gastroesophageal reflux  disease) 1993  ? Heart murmur   ? Hypertension 1993  ? Leaky heart valve   ? Sleep apnea   ? Vitamin D deficiency 2011  ? ? ?PAST SURGICAL HISTORY: ?Past Surgical History:  ?Procedure Laterality Date  ? ABDOMINAL HYSTERECTOMY    ? CHOLECYSTECTOMY    ? CHOLECYSTECTOMY, LAPAROSCOPIC    ? TUBAL LIGATION    ? ? ?FAMILY HISTORY: ?Family History  ?Problem Relation Age of Onset  ? Hypertension Mother   ? Lupus Mother   ? Hypertension Father   ? Diabetes Father   ? Heart disease Father   ? Prostate cancer Father   ? Asthma Brother   ? Asthma Sister   ? Cancer Sister   ?     breast cancer   ? Breast cancer Sister 76  ? Diabetes Sister   ? Diabetes Brother   ? Hypertension Sister   ? Hypertension Brother   ? ? ?SOCIAL HISTORY: ?Social History  ? ?Socioeconomic History  ? Marital status: Widowed  ?  Spouse name: Not on file  ? Number of children: 2  ? Years of education: GED  ? Highest education level: Not on file  ?Occupational History  ? Occupation: Glass blower/designer  ?Tobacco Use  ? Smoking status: Never  ? Smokeless tobacco: Never  ?Substance and Sexual Activity  ? Alcohol use: No  ? Drug use: No  ? Sexual activity: Not on file  ?Other Topics Concern  ? Not on file  ?Social History Narrative  ? Lives in [place] with her sister. She works at [employer] living.   ? Drinks about 2 caffeine drinks a week   ? ?Social Determinants of Health  ? ?Financial Resource Strain: Not on file  ?Food Insecurity: Not on file  ?Transportation Needs: Not on file  ?Physical A

## 2021-06-15 ENCOUNTER — Other Ambulatory Visit: Payer: Self-pay

## 2021-06-15 ENCOUNTER — Encounter: Payer: Self-pay | Admitting: Family Medicine

## 2021-06-15 ENCOUNTER — Ambulatory Visit: Payer: BC Managed Care – PPO | Admitting: Family Medicine

## 2021-06-15 VITALS — BP 145/81 | HR 71 | Ht 62.0 in | Wt 255.2 lb

## 2021-06-15 DIAGNOSIS — Z9989 Dependence on other enabling machines and devices: Secondary | ICD-10-CM

## 2021-06-15 DIAGNOSIS — G4733 Obstructive sleep apnea (adult) (pediatric): Secondary | ICD-10-CM | POA: Diagnosis not present

## 2021-06-23 ENCOUNTER — Telehealth (HOSPITAL_COMMUNITY): Payer: Self-pay | Admitting: *Deleted

## 2021-06-23 ENCOUNTER — Ambulatory Visit (HOSPITAL_BASED_OUTPATIENT_CLINIC_OR_DEPARTMENT_OTHER): Payer: BC Managed Care – PPO

## 2021-06-23 ENCOUNTER — Other Ambulatory Visit: Payer: Self-pay

## 2021-06-23 DIAGNOSIS — I10 Essential (primary) hypertension: Secondary | ICD-10-CM | POA: Diagnosis not present

## 2021-06-23 DIAGNOSIS — I2510 Atherosclerotic heart disease of native coronary artery without angina pectoris: Secondary | ICD-10-CM | POA: Diagnosis not present

## 2021-06-23 DIAGNOSIS — R079 Chest pain, unspecified: Secondary | ICD-10-CM | POA: Diagnosis not present

## 2021-06-23 LAB — ECHOCARDIOGRAM COMPLETE
Area-P 1/2: 3.24 cm2
MV M vel: 5.46 m/s
MV Peak grad: 119.2 mmHg
Radius: 0.5 cm
S' Lateral: 1.9 cm

## 2021-06-23 NOTE — Telephone Encounter (Signed)
Reaching out to patient to offer assistance regarding upcoming cardiac imaging study; pt verbalizes understanding of appt date/time, parking situation and where to check in, pre-test NPO status and medications ordered, and verified current allergies; name and call back number provided for further questions should they arise ? ?Larey Brick RN Navigator Cardiac Imaging ?[facility] ?[phone] office ?[phone] cell ? ?Patient to take 50mg  metoprolol tartrate two hours prior to her cardiac CT scan.  She is aware to arrive at 4pm for her 4;30pm scan. ?

## 2021-06-24 ENCOUNTER — Encounter (HOSPITAL_COMMUNITY): Payer: Self-pay

## 2021-06-24 ENCOUNTER — Ambulatory Visit (HOSPITAL_COMMUNITY)
Admission: RE | Admit: 2021-06-24 | Discharge: 2021-06-24 | Disposition: A | Discharge status: Home / Self Care | Payer: BC Managed Care – PPO | Source: Ambulatory Visit | Attending: Cardiovascular Disease | Admitting: Cardiovascular Disease

## 2021-06-24 DIAGNOSIS — I2510 Atherosclerotic heart disease of native coronary artery without angina pectoris: Secondary | ICD-10-CM | POA: Insufficient documentation

## 2021-06-24 DIAGNOSIS — R079 Chest pain, unspecified: Secondary | ICD-10-CM | POA: Diagnosis not present

## 2021-06-24 MED ORDER — NITROGLYCERIN 0.4 MG SL SUBL
SUBLINGUAL_TABLET | SUBLINGUAL | Status: AC
  Filled 2021-06-24: qty 2

## 2021-06-24 MED ORDER — IOHEXOL 350 MG/ML SOLN
100.0000 mL | Freq: Once | INTRAVENOUS | Status: AC | PRN
  Administered 2021-06-24: 100 mL via INTRAVENOUS

## 2021-06-24 MED ORDER — NITROGLYCERIN 0.4 MG SL SUBL
0.8000 mg | SUBLINGUAL_TABLET | Freq: Once | SUBLINGUAL | Status: AC
  Administered 2021-06-24: 0.8 mg via SUBLINGUAL

## 2021-06-29 ENCOUNTER — Other Ambulatory Visit: Payer: Self-pay | Admitting: Family

## 2021-06-29 DIAGNOSIS — Z1231 Encounter for screening mammogram for malignant neoplasm of breast: Secondary | ICD-10-CM

## 2021-06-30 ENCOUNTER — Ambulatory Visit
Admission: RE | Admit: 2021-06-30 | Discharge: 2021-06-30 | Disposition: A | Discharge status: Home / Self Care | Payer: BC Managed Care – PPO | Source: Ambulatory Visit | Attending: Family | Admitting: Family

## 2021-06-30 DIAGNOSIS — Z1231 Encounter for screening mammogram for malignant neoplasm of breast: Secondary | ICD-10-CM

## 2021-07-06 DIAGNOSIS — G4733 Obstructive sleep apnea (adult) (pediatric): Secondary | ICD-10-CM | POA: Diagnosis not present

## 2021-07-09 ENCOUNTER — Other Ambulatory Visit: Payer: Self-pay | Admitting: Family

## 2021-07-17 ENCOUNTER — Encounter: Payer: Self-pay | Admitting: Family

## 2021-07-17 ENCOUNTER — Ambulatory Visit (INDEPENDENT_AMBULATORY_CARE_PROVIDER_SITE_OTHER): Payer: BC Managed Care – PPO | Admitting: Family

## 2021-07-17 VITALS — BP 132/70 | HR 68 | Temp 97.7°F | Ht 63.0 in | Wt 254.8 lb

## 2021-07-17 DIAGNOSIS — Z0184 Encounter for antibody response examination: Secondary | ICD-10-CM | POA: Diagnosis not present

## 2021-07-17 DIAGNOSIS — Z0000 Encounter for general adult medical examination without abnormal findings: Secondary | ICD-10-CM

## 2021-07-17 DIAGNOSIS — R7303 Prediabetes: Secondary | ICD-10-CM

## 2021-07-17 DIAGNOSIS — Z1159 Encounter for screening for other viral diseases: Secondary | ICD-10-CM | POA: Diagnosis not present

## 2021-07-17 DIAGNOSIS — Z23 Encounter for immunization: Secondary | ICD-10-CM | POA: Diagnosis not present

## 2021-07-17 LAB — CBC WITH DIFFERENTIAL/PLATELET
Basophils Absolute: 0.1 10*3/uL (ref 0.0–0.1)
Basophils Relative: 1.8 % (ref 0.0–3.0)
Eosinophils Absolute: 0.2 10*3/uL (ref 0.0–0.7)
Eosinophils Relative: 2.5 % (ref 0.0–5.0)
HCT: 37.7 % (ref 36.0–46.0)
Hemoglobin: 12.5 g/dL (ref 12.0–15.0)
Lymphocytes Relative: 31.2 % (ref 12.0–46.0)
Lymphs Abs: 2 10*3/uL (ref 0.7–4.0)
MCHC: 33.2 g/dL (ref 30.0–36.0)
MCV: 89.4 fl (ref 78.0–100.0)
Monocytes Absolute: 0.5 10*3/uL (ref 0.1–1.0)
Monocytes Relative: 7 % (ref 3.0–12.0)
Neutro Abs: 3.7 10*3/uL (ref 1.4–7.7)
Neutrophils Relative %: 57.5 % (ref 43.0–77.0)
Platelets: 244 10*3/uL (ref 150.0–400.0)
RBC: 4.21 Mil/uL (ref 3.87–5.11)
RDW: 14.6 % (ref 11.5–15.5)
WBC: 6.4 10*3/uL (ref 4.0–10.5)

## 2021-07-17 LAB — COMPREHENSIVE METABOLIC PANEL
ALT: 22 U/L (ref 0–35)
AST: 24 U/L (ref 0–37)
Albumin: 4.4 g/dL (ref 3.5–5.2)
Alkaline Phosphatase: 58 U/L (ref 39–117)
BUN: 12 mg/dL (ref 6–23)
CO2: 26 mEq/L (ref 19–32)
Calcium: 9.3 mg/dL (ref 8.4–10.5)
Chloride: 104 mEq/L (ref 96–112)
Creatinine, Ser: 0.9 mg/dL (ref 0.40–1.20)
GFR: 72.92 mL/min (ref >60.00)
Glucose, Bld: 102 mg/dL — ABNORMAL HIGH (ref 70–99)
Potassium: 3.9 mEq/L (ref 3.5–5.1)
Sodium: 139 mEq/L (ref 135–145)
Total Bilirubin: 0.5 mg/dL (ref 0.2–1.2)
Total Protein: 7 g/dL (ref 6.0–8.3)

## 2021-07-17 LAB — HEMOGLOBIN A1C: Hgb A1c MFr Bld: 6.5 % (ref 4.6–6.5)

## 2021-07-17 NOTE — Progress Notes (Signed)
?Amanda [name] is a 56 y.o. female with the following history as recorded in EpicCare:  ?Patient Active Problem List  ? Diagnosis Date Noted  ? Obstructive sleep apnea treated with continuous positive airway pressure (CPAP) 06/01/2018  ? Pre-diabetes 06/01/2018  ? Hyperlipidemia with target LDL less than 100 01/06/2018  ? Stress incontinence of urine 06/26/2017  ? Encounter for general adult medical examination with abnormal findings 05/20/2017  ? Non-seasonal allergic rhinitis 04/14/2017  ? Moderate obstructive sleep apnea 05/20/2016  ? Recurrent sinus infections 06/12/2014  ? Chest pain 01/03/2014  ? GERD (gastroesophageal reflux disease) 03/08/2012  ? Benign essential hypertension 03/08/2012  ?  ?Current Outpatient Medications  ?Medication Sig Dispense Refill  ? aspirin EC 81 MG tablet Take 81 mg by mouth daily. Swallow whole.    ? blood glucose meter kit and supplies KIT Dispense based on patient and insurance preference. Use up to four times daily as directed. (FOR ICD-9 250.00, 250.01). 1 each 0  ? cetirizine (ZYRTEC) 10 MG tablet TAKE 1 TABLET DAILY 90 tablet 3  ? cholecalciferol (VITAMIN D3) 25 MCG (1000 UNIT) tablet Take 1,000 Units by mouth daily.    ? isosorbide mononitrate (IMDUR) 30 MG 24 hr tablet Take 1 tablet (30 mg total) by mouth daily. 90 tablet 3  ? losartan (COZAAR) 100 MG tablet TAKE 1 TABLET DAILY 90 tablet 3  ? metoprolol tartrate (LOPRESSOR) 25 MG tablet Take 1 tablet (25 mg total) by mouth 2 (two) times daily. 180 tablet 3  ? Multiple Vitamins-Minerals (PRESERVISION AREDS 2) CAPS Take by mouth.    ? nitroGLYCERIN (NITROSTAT) 0.4 MG SL tablet Place 1 tablet (0.4 mg total) under the tongue every 5 (five) minutes as needed for chest pain. 25 tablet 3  ? ONETOUCH VERIO test strip USE UP TO FOUR TIMES A DAY AS DIRECTED 100 strip 13  ? pantoprazole (PROTONIX) 40 MG tablet TAKE 1 TABLET DAILY 90 tablet 3  ? rosuvastatin (CRESTOR) 10 MG tablet TAKE 1 TABLET DAILY 90 tablet 3  ? ?No current  facility-administered medications for this visit.  ?  ?Allergies: Patient has no known allergies.  ?Past Medical History:  ?Diagnosis Date  ? Allergy   ? Diverticulitis   ? GERD (gastroesophageal reflux disease) 1993  ? Heart murmur   ? Hypertension 1993  ? Leaky heart valve   ? Sleep apnea   ? Vitamin D deficiency 2011  ?  ?Past Surgical History:  ?Procedure Laterality Date  ? ABDOMINAL HYSTERECTOMY    ? CHOLECYSTECTOMY    ? CHOLECYSTECTOMY, LAPAROSCOPIC    ? TUBAL LIGATION    ?  ?Family History  ?Problem Relation Age of Onset  ? Hypertension Mother   ? Lupus Mother   ? Hypertension Father   ? Diabetes Father   ? Heart disease Father   ? Prostate cancer Father   ? Asthma Brother   ? Asthma Sister   ? Cancer Sister   ?     breast cancer   ? Breast cancer Sister 62  ? Diabetes Sister   ? Diabetes Brother   ? Hypertension Sister   ? Hypertension Brother   ?  ?Social History  ? ?Tobacco Use  ? Smoking status: Never  ? Smokeless tobacco: Never  ?Substance Use Topics  ? Alcohol use: No  ?  ?Subjective:  ? ?Presents for yearly CPE; no acute concerns today; needs fasting labs updated;  ? ?Does see cardiology and neurology for sleep apnea; seeing eye doctor (  diagnosis of macular degeneration) and dentist regularly;  ?Mammogram is up to date;  ? ?Health Maintenance  ?Topic Date Due  ? Hepatitis C Screening  Never done  ? Zoster Vaccines- Shingrix (1 of 2) Never done  ? COVID-19 Vaccine (4 - Booster for Pfizer series) 06/19/2020  ? INFLUENZA VACCINE  10/27/2021  ? MAMMOGRAM  07/01/2023  ? COLONOSCOPY (Pts 45-62yr Insurance coverage will need to be confirmed)  08/31/2025  ? TETANUS/TDAP  05/21/2027  ? HIV Screening  Completed  ? HPV VACCINES  Aged Out  ? ?Review of Systems  ?Constitutional: Negative.   ?HENT: Negative.    ?Eyes: Negative.   ?Respiratory:  Positive for shortness of breath.   ?     Under care of cardiology  ?Cardiovascular: Negative.   ?Gastrointestinal: Negative.   ?Genitourinary: Negative.    ?Musculoskeletal: Negative.   ?Skin: Negative.   ?Neurological: Negative.   ?Endo/Heme/Allergies: Negative.   ?Psychiatric/Behavioral: Negative.    ? ? ?Objective:  ?Vitals:  ? 07/17/21 1000  ?BP: 132/70  ?Pulse: 68  ?Temp: 97.7 ?F (36.5 ?C)  ?TempSrc: Oral  ?SpO2: 97%  ?Weight: 254 lb 12.8 oz (115.6 kg)  ?Height: '5\' 3"'  (1.6 m)  ?  ?General: Well developed, well nourished, in no acute distress  ?Skin : Warm and dry.  ?Head: Normocephalic and atraumatic  ?Eyes: Sclera and conjunctiva clear; pupils round and reactive to light; extraocular movements intact  ?Ears: External normal; canals clear; tympanic membranes normal  ?Oropharynx: Pink, supple. No suspicious lesions  ?Neck: Supple without thyromegaly, adenopathy  ?Lungs: Respirations unlabored; clear to auscultation bilaterally without wheeze, rales, rhonchi  ?CVS exam: normal rate and regular rhythm.  ?Abdomen: Soft; nontender; nondistended; normoactive bowel sounds; no masses or hepatosplenomegaly  ?Musculoskeletal: No deformities; no active joint inflammation  ?Extremities: No edema, cyanosis, clubbing  ?Vessels: Symmetric bilaterally  ?Neurologic: Alert and oriented; speech intact; face symmetrical; moves all extremities well; CNII-XII intact without focal deficit  ? ?Assessment:  ?1. PE (physical exam), annual   ?2. Need for shingles vaccine   ?3. Pre-diabetes   ?4. Need for hepatitis C screening test   ?5. Immunity status testing   ?  ?Plan:  ?Age appropriate preventive healthcare needs addressed; encouraged regular eye doctor and dental exams; encouraged regular exercise; will update labs and refills as needed today; follow-up to be determined; ?? Need for medication to treat blood glucose- ? If this could be contributing to feeling of weakness; ?Increase Protonix to bid to see if cough improves;  ? ?This visit occurred during the SARS-CoV-2 public health emergency.  Safety protocols were in place, including screening questions prior to the visit, additional  usage of staff PPE, and extensive cleaning of exam room while observing appropriate contact time as indicated for disinfecting solutions.  ? ? ? ?Return in about 2 months (around 09/16/2021).  ?Orders Placed This Encounter  ?Procedures  ? CBC with Differential/Platelet  ? Comp Met (CMET)  ? Hemoglobin A1c  ? Hepatitis C Antibody  ? Varicella zoster antibody, IgG  ?  ?Requested Prescriptions  ? ? No prescriptions requested or ordered in this encounter  ?  ? ?

## 2021-07-17 NOTE — Patient Instructions (Signed)
Try taking the Protonix twice a day for the next week to see how your morning cough/ congestion respond;  ?

## 2021-07-20 ENCOUNTER — Telehealth: Payer: Self-pay | Admitting: *Deleted

## 2021-07-20 NOTE — Telephone Encounter (Signed)
Advised patient of her results and she stated that she would like to start the metformin. ?

## 2021-07-20 NOTE — Telephone Encounter (Signed)
-----   Message from Olive Bass, FNP sent at 07/20/2021 11:24 AM EDT ----- ?She does have immunity to chicken pox- we can do the shingles vaccine; will plan to start at upcoming visit in June. ?Her blood sugar is up slightly- would technically be considered diabetic with Hgba1c at 6.5. Would recommend trial of Metformin once per day to see if this helps her feel better. We could also consider nutrition consult. Please let me know her response.  ?

## 2021-07-21 ENCOUNTER — Other Ambulatory Visit: Payer: Self-pay | Admitting: Family

## 2021-07-21 MED ORDER — METFORMIN HCL 500 MG PO TABS: 500.0000 mg | ORAL_TABLET | Freq: Every day | ORAL | 0 refills | Status: DC

## 2021-07-22 LAB — HEPATITIS C ANTIBODY
Hepatitis C Ab: NONREACTIVE
SIGNAL TO CUT-OFF: 0.05 (ref <1.00)

## 2021-07-22 LAB — VARICELLA ZOSTER ANTIBODY, IGG: Varicella IgG: 784.1 index

## 2021-08-05 DIAGNOSIS — G4733 Obstructive sleep apnea (adult) (pediatric): Secondary | ICD-10-CM | POA: Diagnosis not present

## 2021-08-24 ENCOUNTER — Other Ambulatory Visit: Payer: Self-pay | Admitting: Family

## 2021-08-24 DIAGNOSIS — I10 Essential (primary) hypertension: Secondary | ICD-10-CM

## 2021-08-28 ENCOUNTER — Encounter: Payer: Self-pay | Admitting: Family

## 2021-08-28 ENCOUNTER — Ambulatory Visit: Payer: BC Managed Care – PPO | Admitting: Family

## 2021-08-28 VITALS — BP 127/76 | HR 66 | Ht 63.0 in | Wt 245.0 lb

## 2021-08-28 DIAGNOSIS — K219 Gastro-esophageal reflux disease without esophagitis: Secondary | ICD-10-CM

## 2021-08-28 DIAGNOSIS — Z23 Encounter for immunization: Secondary | ICD-10-CM | POA: Diagnosis not present

## 2021-08-28 DIAGNOSIS — J3089 Other allergic rhinitis: Secondary | ICD-10-CM | POA: Diagnosis not present

## 2021-08-28 DIAGNOSIS — R7303 Prediabetes: Secondary | ICD-10-CM | POA: Diagnosis not present

## 2021-08-28 MED ORDER — PANTOPRAZOLE SODIUM 40 MG PO TBEC: 40.0000 mg | DELAYED_RELEASE_TABLET | Freq: Every day | ORAL | 3 refills | Status: DC

## 2021-08-28 MED ORDER — CETIRIZINE HCL 10 MG PO TABS
10.0000 mg | ORAL_TABLET | Freq: Every day | ORAL | 3 refills | Status: DC
Start: 2021-08-28 — End: 2023-05-02

## 2021-08-28 NOTE — Progress Notes (Signed)
Amanda [name] is a 56 y.o. female with the following history as recorded in EpicCare:  Patient Active Problem List   Diagnosis Date Noted   Obstructive sleep apnea treated with continuous positive airway pressure (CPAP) 06/01/2018   Pre-diabetes 06/01/2018   Hyperlipidemia with target LDL less than 100 01/06/2018   Stress incontinence of urine 06/26/2017   Encounter for general adult medical examination with abnormal findings 05/20/2017   Non-seasonal allergic rhinitis 04/14/2017   Moderate obstructive sleep apnea 05/20/2016   Recurrent sinus infections 06/12/2014   Chest pain 01/03/2014   GERD (gastroesophageal reflux disease) 03/08/2012   Benign essential hypertension 03/08/2012    Current Outpatient Medications  Medication Sig Dispense Refill   aspirin EC 81 MG tablet Take 81 mg by mouth daily. Swallow whole.     blood glucose meter kit and supplies KIT Dispense based on patient and insurance preference. Use up to four times daily as directed. (FOR ICD-9 250.00, 250.01). 1 each 0   cetirizine (ZYRTEC) 10 MG tablet Take 1 tablet (10 mg total) by mouth daily. 90 tablet 3   cholecalciferol (VITAMIN D3) 25 MCG (1000 UNIT) tablet Take 1,000 Units by mouth daily.     isosorbide mononitrate (IMDUR) 30 MG 24 hr tablet Take 1 tablet (30 mg total) by mouth daily. 90 tablet 3   losartan (COZAAR) 100 MG tablet TAKE 1 TABLET DAILY 90 tablet 3   metFORMIN (GLUCOPHAGE) 500 MG tablet Take 1 tablet (500 mg total) by mouth daily after supper. 90 tablet 0   metoprolol tartrate (LOPRESSOR) 25 MG tablet Take 1 tablet (25 mg total) by mouth 2 (two) times daily. 180 tablet 3   Multiple Vitamins-Minerals (PRESERVISION AREDS 2) CAPS Take by mouth.     nitroGLYCERIN (NITROSTAT) 0.4 MG SL tablet Place 1 tablet (0.4 mg total) under the tongue every 5 (five) minutes as needed for chest pain. 25 tablet 3   ONETOUCH VERIO test strip USE UP TO FOUR TIMES A DAY AS DIRECTED 100 strip 13   pantoprazole  (PROTONIX) 40 MG tablet Take 1 tablet (40 mg total) by mouth daily. 90 tablet 3   rosuvastatin (CRESTOR) 10 MG tablet TAKE 1 TABLET DAILY 90 tablet 3   No current facility-administered medications for this visit.    Allergies: Patient has no known allergies.  Past Medical History:  Diagnosis Date   Allergy    Diverticulitis    GERD (gastroesophageal reflux disease) 1993   Heart murmur    Hypertension 1993   Leaky heart valve    Sleep apnea    Vitamin D deficiency 2011    Past Surgical History:  Procedure Laterality Date   ABDOMINAL HYSTERECTOMY     CHOLECYSTECTOMY     CHOLECYSTECTOMY, LAPAROSCOPIC     TUBAL LIGATION      Family History  Problem Relation Age of Onset   Hypertension Mother    Lupus Mother    Hypertension Father    Diabetes Father    Heart disease Father    Prostate cancer Father    Asthma Brother    Asthma Sister    Cancer Sister        breast cancer    Breast cancer Sister 64   Diabetes Sister    Diabetes Brother    Hypertension Sister    Hypertension Brother     Social History   Tobacco Use   Smoking status: Never   Smokeless tobacco: Never  Substance Use Topics   Alcohol use: No  Subjective:   Follow up on recent start of Metformin; has lost 9 pounds since last OV; very pleased with how much better she is feeling; no further nausea or having to sit at work due to "feeling so shaky." Working on portion control as well;   Would also like to start Shingles vaccine today;     Objective:  Vitals:   08/28/21 0955  BP: 127/76  Pulse: 66  Weight: 245 lb (111.1 kg)  Height: 5' 3" (1.6 m)    General: Well developed, well nourished, in no acute distress  Skin : Warm and dry.  Head: Normocephalic and atraumatic  Eyes: Sclera and conjunctiva clear; pupils round and reactive to light; extraocular movements intact  Ears: External normal; canals clear; tympanic membranes normal  Oropharynx: Pink, supple. No suspicious lesions  Neck: Supple  without thyromegaly, adenopathy  Lungs: Respirations unlabored; clear to auscultation bilaterally without wheeze, rales, rhonchi  CVS exam: normal rate and regular rhythm.  Neurologic: Alert and oriented; speech intact; face symmetrical; moves all extremities well; CNII-XII intact without focal deficit  Assessment:  1. Pre-diabetes   2. Need for shingles vaccine   3. Gastroesophageal reflux disease, unspecified whether esophagitis present   4. Non-seasonal allergic rhinitis, unspecified trigger     Plan:  Good response to Metformin; continue as prescribed; congratulated patient on weight loss; follow up in 3 months;  3. Stable on regimen; Refills updated as requested;  4. Stable; will see if insurance will cover medication.   Return in about 3 months (around 11/28/2021).  Orders Placed This Encounter  Procedures   Varicella-zoster vaccine IM (Shingrix)    Requested Prescriptions   Signed Prescriptions Disp Refills   pantoprazole (PROTONIX) 40 MG tablet 90 tablet 3    Sig: Take 1 tablet (40 mg total) by mouth daily.   cetirizine (ZYRTEC) 10 MG tablet 90 tablet 3    Sig: Take 1 tablet (10 mg total) by mouth daily.     

## 2021-09-04 ENCOUNTER — Other Ambulatory Visit: Payer: Self-pay

## 2021-09-04 MED ORDER — METOPROLOL TARTRATE 25 MG PO TABS: 25.0000 mg | ORAL_TABLET | Freq: Two times a day (BID) | ORAL | 3 refills | Status: DC

## 2021-09-04 MED ORDER — ISOSORBIDE MONONITRATE ER 30 MG PO TB24: 30.0000 mg | ORAL_TABLET | Freq: Every day | ORAL | 3 refills | Status: DC

## 2021-10-02 ENCOUNTER — Encounter: Payer: Self-pay | Admitting: Family

## 2021-10-02 ENCOUNTER — Other Ambulatory Visit: Payer: Self-pay | Admitting: Family

## 2021-10-02 MED ORDER — PANTOPRAZOLE SODIUM 40 MG PO TBEC: 40.0000 mg | DELAYED_RELEASE_TABLET | Freq: Two times a day (BID) | ORAL | 3 refills | Status: DC

## 2021-10-13 DIAGNOSIS — G4733 Obstructive sleep apnea (adult) (pediatric): Secondary | ICD-10-CM | POA: Diagnosis not present

## 2021-10-16 ENCOUNTER — Encounter: Payer: Self-pay | Admitting: Cardiovascular Disease

## 2021-10-16 NOTE — Progress Notes (Unsigned)
Cardiology Office Note:    Date:  10/19/2021   ID:  Amanda [name], DOB 28-Feb-1966, MRN [id number]  PCP:  Marrian Salvage, FNP  Cardiologist:   New to [facility]   Referring MD: Marrian Salvage,*   Chief Complaint  Patient presents with   Coronary Artery Disease        Chest Pain       Amanda [name] is a 56 y.o. female with a hx of hypertension.  We are asked to see her today for further evaluation of chest discomfort by Dr.  Gayla Medicus   Has had palpitations for the past 5 years.  Associated with near syncope Has occasional CP.    Wakes up with chest pain .   Early morning hours or middle of the night .  Not exertional . Does not get any regular exercise.   Just bought a treadmill .   Has not started using it yet   Is eating more salt than usual.   earting more fast foods.   Having to take care of her mom.  Her father is now in a memory care unit.  Looking for assisted living care.     Sept. 26, , 2019 Blondell presented to me several months ago with some atypical chest pain.  We performed a coronary CT angiogram.  She was found to have mild coronary artery disease involving the left anterior descending artery.  Her coronary calcium score was 1.  Feb. 12, 2021:  Amanda [name] is seen today for a follow-up visit.  She is had some chest pain in the past.  Coronary CT angiogram reveals mild coronary artery disease involving the left anterior descending artery.  Coronary calcium score was 1. BP is a little elevated.    Still eating salt .   Is not exercising any longer   June 11, 2021:  [name] is seen today for follow-up visit.  She has a history of hypertension and hyperlipidemia.  Lipid levels from April, 2022 were reviewed.  Her total cholesterol is 139. HDL is 47. LDL is 67. Triglyceride level is 126.  Has sudden episodes of generalized weakness, dyspnea , milgl chest pain  Has to lie down  Might last several min Feels better after a nap   Has  OSA , uses her CPAP regularly   Previous echo in [place] 2016:   normal LV function , EF 55-60%. Grade 1 DD   Works 12 hours days,  really wears her out .     October 19, 2021 Amanda [name] is seen for follow up of her HTN, hyperlipidemia  Has lost weight  Wt is 230 lbs.   Rare episodes of chest tightness   Coronary CT angiogram performed in July, 2019 revealed a coronary calcium score of 1 which places her in the 104 percentile for age and gender matched controls. CT angiograms of her coronary arteries revealed mild coronary disease.  I stressed the importance of lipid control.  Her last LDL was performed in March, 2023 and her LDL level was 73.  Triglyceride levels 132.  Has mild MR on exam and on echo    Past Medical History:  Diagnosis Date   Allergy    Diverticulitis    GERD (gastroesophageal reflux disease) 1993   Heart murmur    Hypertension 1993   Leaky heart valve    Sleep apnea    Vitamin D deficiency 2011    Past Surgical History:  Procedure Laterality Date   ABDOMINAL HYSTERECTOMY  CHOLECYSTECTOMY     CHOLECYSTECTOMY, LAPAROSCOPIC     TUBAL LIGATION      Current Medications: Current Meds  Medication Sig   aspirin EC 81 MG tablet Take 81 mg by mouth daily. Swallow whole.   blood glucose meter kit and supplies KIT Dispense based on patient and insurance preference. Use up to four times daily as directed. (FOR ICD-9 250.00, 250.01).   cetirizine (ZYRTEC) 10 MG tablet Take 1 tablet (10 mg total) by mouth daily.   cholecalciferol (VITAMIN D3) 25 MCG (1000 UNIT) tablet Take 1,000 Units by mouth daily.   isosorbide mononitrate (IMDUR) 30 MG 24 hr tablet Take 1 tablet (30 mg total) by mouth daily.   losartan (COZAAR) 100 MG tablet TAKE 1 TABLET DAILY   metFORMIN (GLUCOPHAGE) 500 MG tablet Take 1 tablet (500 mg total) by mouth daily after supper.   metoprolol tartrate (LOPRESSOR) 25 MG tablet Take 1 tablet (25 mg total) by mouth 2 (two) times daily.   Multiple  Vitamins-Minerals (PRESERVISION AREDS 2) CAPS Take by mouth.   ONETOUCH VERIO test strip USE UP TO FOUR TIMES A DAY AS DIRECTED   pantoprazole (PROTONIX) 40 MG tablet Take 1 tablet (40 mg total) by mouth 2 (two) times daily.   rosuvastatin (CRESTOR) 10 MG tablet TAKE 1 TABLET DAILY     Allergies:   Patient has no known allergies.   Social History   Socioeconomic History   Marital status: Widowed    Spouse name: Not on file   Number of children: 2   Years of education: GED   Highest education level: Not on file  Occupational History   Occupation: Glass blower/designer  Tobacco Use   Smoking status: Never   Smokeless tobacco: Never  Substance and Sexual Activity   Alcohol use: No   Drug use: No   Sexual activity: Not on file  Other Topics Concern   Not on file  Social History Narrative   Lives in [place] with her sister. She works at [employer] living.    Drinks about 2 caffeine drinks a week    Social Determinants of Radio broadcast assistant Strain: Not on file  Food Insecurity: Not on file  Transportation Needs: Not on file  Physical Activity: Not on file  Stress: Not on file  Social Connections: Not on file     Family History: The patient's family history includes Asthma in her brother and sister; Breast cancer (age of onset: 81) in her sister; Cancer in her sister; Diabetes in her brother, father, and sister; Heart disease in her father; Hypertension in her brother, father, mother, and sister; Lupus in her mother; Prostate cancer in her father.  ROS:   Please see the history of present illness.     All other systems reviewed and are negative.  EKGs/Labs/Other Studies Reviewed:    The following studies were reviewed today:    Recent Labs: 07/17/2021: ALT 22; BUN 12; Creatinine, Ser 0.90; Hemoglobin 12.5; Platelets 244.0; Potassium 3.9; Sodium 139  Recent Lipid Panel    Component Value Date/Time   CHOL 144 06/11/2021 1117   TRIG 132 06/11/2021 1117    HDL 48 06/11/2021 1117   CHOLHDL 3.0 06/11/2021 1117   CHOLHDL 3 07/01/2020 0950   VLDL 25.2 07/01/2020 0950   LDLCALC 73 06/11/2021 1117   Physical Exam: Blood pressure 124/78, pulse 76, height '5\' 2"'  (1.575 m), weight 230 lb (104.3 kg), SpO2 98 %.  GEN:  moderately obese female,  n no acute distress HEENT: Normal NECK: No JVD; No carotid bruits LYMPHATICS: No lymphadenopathy CARDIAC: RRR ,soft systolic murmur radiating to her Left axillary line  RESPIRATORY:  Clear to auscultation without rales, wheezing or rhonchi  ABDOMEN: Soft, non-tender, non-distended MUSCULOSKELETAL:  No edema; No deformity  SKIN: Warm and dry NEUROLOGIC:  Alert and oriented x 3  NEUROLOGIC:  Alert and oriented x 3   EKG:    ASSESSMENT:    No diagnosis found.   PLAN:     Chest pain:       She has occasional episodes of chest tightness.  We performed a coronary CT angiogram in 2019.  She only had minimal coronary artery irregularities.  Her lipids have been well controlled.  Her symptoms are likely coming from something other than her heart.  2.  Hypertension:       Blood pressure is well controlled.  I have encouraged her to continue with her weight loss efforts.  Continue current medications.  3.  Hyperlipidemia:    Lipids look very well controlled.  Continue current medications.  I suspect that her LDL will improve even further with weight loss.    Medication Adjustments/Labs and Tests Ordered: Current medicines are reviewed at length with the patient today.  Concerns regarding medicines are outlined above.  No orders of the defined types were placed in this encounter.  No orders of the defined types were placed in this encounter.     Patient Instructions  Medication Instructions:  Your physician recommends that you continue on your current medications as directed. Please refer to the Current Medication list given to you today.  *If you need a refill on your cardiac medications  before your next appointment, please call your pharmacy*  Follow-Up: At [facility], you and your health needs are our priority.  As part of our continuing mission to provide you with exceptional heart care, we have created designated Provider Care Teams.  These Care Teams include your primary Cardiologist (physician) and Advanced Practice Providers (APPs -  Physician Assistants and Nurse Practitioners) who all work together to provide you with the care you need, when you need it.   Your next appointment:   1 year(s)  The format for your next appointment:   In Person  Provider:   Mertie Moores, MD          Signed, Mertie Moores, MD  10/19/2021 5:58 PM    [place]

## 2021-10-19 ENCOUNTER — Ambulatory Visit: Payer: BC Managed Care – PPO | Admitting: Cardiovascular Disease

## 2021-10-19 ENCOUNTER — Other Ambulatory Visit: Payer: Self-pay | Admitting: Family

## 2021-10-19 ENCOUNTER — Encounter: Payer: Self-pay | Admitting: Cardiovascular Disease

## 2021-10-19 VITALS — BP 124/78 | HR 76 | Ht 62.0 in | Wt 230.0 lb

## 2021-10-19 DIAGNOSIS — E785 Hyperlipidemia, unspecified: Secondary | ICD-10-CM | POA: Diagnosis not present

## 2021-10-19 DIAGNOSIS — R0789 Other chest pain: Secondary | ICD-10-CM

## 2021-10-19 NOTE — Patient Instructions (Signed)
Medication Instructions:  Your physician recommends that you continue on your current medications as directed. Please refer to the Current Medication list given to you today.  *If you need a refill on your cardiac medications before your next appointment, please call your pharmacy*   Follow-Up: At [facility], you and your health needs are our priority.  As part of our continuing mission to provide you with exceptional heart care, we have created designated Provider Care Teams.  These Care Teams include your primary Cardiologist (physician) and Advanced Practice Providers (APPs -  Physician Assistants and Nurse Practitioners) who all work together to provide you with the care you need, when you need it.  Your next appointment:   1 year(s)  The format for your next appointment:   In Person  Provider:   Philip Nahser, MD            

## 2021-11-13 DIAGNOSIS — G4733 Obstructive sleep apnea (adult) (pediatric): Secondary | ICD-10-CM | POA: Diagnosis not present

## 2021-12-01 ENCOUNTER — Ambulatory Visit: Payer: BC Managed Care – PPO | Admitting: Family

## 2021-12-01 VITALS — BP 122/80 | HR 66 | Temp 98.6°F | Resp 18 | Ht 63.0 in | Wt 219.0 lb

## 2021-12-01 DIAGNOSIS — Z23 Encounter for immunization: Secondary | ICD-10-CM | POA: Diagnosis not present

## 2021-12-01 DIAGNOSIS — R7309 Other abnormal glucose: Secondary | ICD-10-CM

## 2021-12-01 LAB — COMPREHENSIVE METABOLIC PANEL
ALT: 17 U/L (ref 0–35)
AST: 18 U/L (ref 0–37)
Albumin: 4.2 g/dL (ref 3.5–5.2)
Alkaline Phosphatase: 50 U/L (ref 39–117)
BUN: 17 mg/dL (ref 6–23)
CO2: 28 mEq/L (ref 19–32)
Calcium: 9.8 mg/dL (ref 8.4–10.5)
Chloride: 103 mEq/L (ref 96–112)
Creatinine, Ser: 0.92 mg/dL (ref 0.40–1.20)
GFR: 70.84 mL/min (ref >60.00)
Glucose, Bld: 92 mg/dL (ref 70–99)
Potassium: 4.1 mEq/L (ref 3.5–5.1)
Sodium: 140 mEq/L (ref 135–145)
Total Bilirubin: 0.5 mg/dL (ref 0.2–1.2)
Total Protein: 7.1 g/dL (ref 6.0–8.3)

## 2021-12-01 LAB — HEMOGLOBIN A1C: Hgb A1c MFr Bld: 6 % (ref 4.6–6.5)

## 2021-12-01 NOTE — Progress Notes (Signed)
Amanda [name] is a 56 y.o. female with the following history as recorded in Epic[facility]:  Patient Active Problem List   Diagnosis Date Noted   Obstructive sleep apnea treated with continuous positive airway pressure (CPAP) 06/01/2018   Pre-diabetes 06/01/2018   Hyperlipidemia with target LDL less than 100 01/06/2018   Stress incontinence of urine 06/26/2017   Encounter for general adult medical examination with abnormal findings 05/20/2017   Non-seasonal allergic rhinitis 04/14/2017   Moderate obstructive sleep apnea 05/20/2016   Recurrent sinus infections 06/12/2014   Chest pain 01/03/2014   GERD (gastroesophageal reflux disease) 03/08/2012   Benign essential hypertension 03/08/2012    Current Outpatient Medications  Medication Sig Dispense Refill   aspirin EC 81 MG tablet Take 81 mg by mouth daily. Swallow whole.     blood glucose meter kit and supplies KIT Dispense based on patient and insurance preference. Use up to four times daily as directed. (FOR ICD-9 250.00, 250.01). 1 each 0   cetirizine (ZYRTEC) 10 MG tablet Take 1 tablet (10 mg total) by mouth daily. 90 tablet 3   cholecalciferol (VITAMIN D3) 25 MCG (1000 UNIT) tablet Take 1,000 Units by mouth daily.     isosorbide mononitrate (IMDUR) 30 MG 24 hr tablet Take 1 tablet (30 mg total) by mouth daily. 90 tablet 3   losartan (COZAAR) 100 MG tablet TAKE 1 TABLET DAILY 90 tablet 3   metFORMIN (GLUCOPHAGE) 500 MG tablet TAKE 1 TABLET DAILY AFTER SUPPER 90 tablet 3   metoprolol tartrate (LOPRESSOR) 25 MG tablet Take 1 tablet (25 mg total) by mouth 2 (two) times daily. 180 tablet 3   Multiple Vitamins-Minerals (PRESERVISION AREDS 2) CAPS Take by mouth.     ONETOUCH VERIO test strip USE UP TO FOUR TIMES A DAY AS DIRECTED 100 strip 13   pantoprazole (PROTONIX) 40 MG tablet Take 1 tablet (40 mg total) by mouth 2 (two) times daily. 180 tablet 3   rosuvastatin (CRESTOR) 10 MG tablet TAKE 1 TABLET DAILY 90 tablet 3   nitroGLYCERIN  (NITROSTAT) 0.4 MG SL tablet Place 1 tablet (0.4 mg total) under the tongue every 5 (five) minutes as needed for chest pain. 25 tablet 3   No current facility-administered medications for this visit.    Allergies: Patient has no known allergies.  Past Medical History:  Diagnosis Date   Allergy    Diverticulitis    GERD (gastroesophageal reflux disease) 1993   Heart murmur    Hypertension 1993   Leaky heart valve    Sleep apnea    Vitamin D deficiency 2011    Past Surgical History:  Procedure Laterality Date   ABDOMINAL HYSTERECTOMY     CHOLECYSTECTOMY     CHOLECYSTECTOMY, LAPAROSCOPIC     TUBAL LIGATION      Family History  Problem Relation Age of Onset   Hypertension Mother    Lupus Mother    Hypertension Father    Diabetes Father    Heart disease Father    Prostate cancer Father    Asthma Brother    Asthma Sister    Cancer Sister        breast cancer    Breast cancer Sister 24   Diabetes Sister    Diabetes Brother    Hypertension Sister    Hypertension Brother     Social History   Tobacco Use   Smoking status: Never   Smokeless tobacco: Never  Substance Use Topics   Alcohol use: No  Subjective:   3 month follow up on elevated glucose/ pre-diabetes; has been doing very well and has been able to lose weight almost 30 pounds since April 2023;  "Feeling very good."  Also needs Shingles #2;    Objective:  Vitals:   12/01/21 0957  BP: 122/80  Pulse: 66  Resp: 18  Temp: 98.6 F (37 C)  TempSrc: Temporal  SpO2: 98%  Weight: 219 lb (99.3 kg)  Height: _0  (1.6 m)    General: Well developed, well nourished, in no acute distress  Skin : Warm and dry.  Head: Normocephalic and atraumatic  Lungs: Respirations unlabored; clear to auscultation bilaterally without wheeze, rales, rhonchi  CVS exam: normal rate and regular rhythm.  Neurologic: Alert and oriented; speech intact; face symmetrical; moves all extremities well; CNII-XII intact without focal  deficit   Assessment:  1. Elevated glucose   2. Need for shingles vaccine     Plan:  Will update labs today; congratulated patient on commitment to her health;  Shingles #2 updated today;   Return in about 8 months (around 08/01/2022) for CPE.  Orders Placed This Encounter  Procedures   Zoster Recombinant (Shingrix )   Comp Met (CMET)   Hemoglobin A1c    Requested Prescriptions    No prescriptions requested or ordered in this encounter

## 2021-12-14 DIAGNOSIS — G4733 Obstructive sleep apnea (adult) (pediatric): Secondary | ICD-10-CM | POA: Diagnosis not present

## 2022-01-05 DIAGNOSIS — Z23 Encounter for immunization: Secondary | ICD-10-CM | POA: Diagnosis not present

## 2022-01-11 DIAGNOSIS — G4733 Obstructive sleep apnea (adult) (pediatric): Secondary | ICD-10-CM | POA: Diagnosis not present

## 2022-02-11 DIAGNOSIS — G4733 Obstructive sleep apnea (adult) (pediatric): Secondary | ICD-10-CM | POA: Diagnosis not present

## 2022-03-13 DIAGNOSIS — G4733 Obstructive sleep apnea (adult) (pediatric): Secondary | ICD-10-CM | POA: Diagnosis not present

## 2022-03-24 ENCOUNTER — Other Ambulatory Visit: Payer: Self-pay | Admitting: Family

## 2022-04-15 DIAGNOSIS — G4733 Obstructive sleep apnea (adult) (pediatric): Secondary | ICD-10-CM | POA: Diagnosis not present

## 2022-05-16 DIAGNOSIS — G4733 Obstructive sleep apnea (adult) (pediatric): Secondary | ICD-10-CM | POA: Diagnosis not present

## 2022-05-18 ENCOUNTER — Encounter: Payer: Self-pay | Admitting: Family

## 2022-05-27 ENCOUNTER — Other Ambulatory Visit: Payer: Self-pay

## 2022-05-27 DIAGNOSIS — Z1231 Encounter for screening mammogram for malignant neoplasm of breast: Secondary | ICD-10-CM

## 2022-06-14 DIAGNOSIS — G4733 Obstructive sleep apnea (adult) (pediatric): Secondary | ICD-10-CM | POA: Diagnosis not present

## 2022-06-16 ENCOUNTER — Ambulatory Visit: Payer: BC Managed Care – PPO | Admitting: Family Medicine

## 2022-06-23 ENCOUNTER — Ambulatory Visit: Payer: BC Managed Care – PPO | Admitting: Family Medicine

## 2022-06-23 ENCOUNTER — Encounter: Payer: Self-pay | Admitting: Family Medicine

## 2022-06-23 ENCOUNTER — Ambulatory Visit (HOSPITAL_BASED_OUTPATIENT_CLINIC_OR_DEPARTMENT_OTHER)
Admission: RE | Admit: 2022-06-23 | Discharge: 2022-06-23 | Disposition: A | Discharge status: Home / Self Care | Payer: BC Managed Care – PPO | Source: Ambulatory Visit | Attending: Family Medicine | Admitting: Family Medicine

## 2022-06-23 VITALS — BP 137/65 | HR 71 | Temp 97.7°F | Resp 18 | Ht 63.0 in | Wt 202.6 lb

## 2022-06-23 DIAGNOSIS — M25551 Pain in right hip: Secondary | ICD-10-CM | POA: Diagnosis not present

## 2022-06-23 DIAGNOSIS — M25552 Pain in left hip: Secondary | ICD-10-CM | POA: Diagnosis not present

## 2022-06-23 DIAGNOSIS — M160 Bilateral primary osteoarthritis of hip: Secondary | ICD-10-CM | POA: Diagnosis not present

## 2022-06-23 MED ORDER — MELOXICAM 15 MG PO TABS: 15.0000 mg | ORAL_TABLET | Freq: Every day | ORAL | 0 refills | Status: DC

## 2022-06-23 NOTE — Patient Instructions (Addendum)
Xray today given duration of symptoms Referral to physical therapy Adding trial of Meloxicam (antiinflammatory - do NOT take with any other antiinflammatories like ibuprofen, Advil, Aleve, etc). Tylenol is okay Try heating pad, staying active, stretching - home exercise handout provided  Please contact office for follow-up if symptoms do not improve or worsen. Seek emergency care if symptoms become severe.

## 2022-06-23 NOTE — Progress Notes (Signed)
Acute Office Visit  Subjective:     Patient ID: Amanda [name], female    DOB: 06-03-1965, 57 y.o.   MRN: [id number]  Chief Complaint  Patient presents with   leg pain/ weakness    Worse at night Both legs from hip to ankles started 2 months ago  Taking Tylenol     HPI Patient is in today for hip pain, right worse than left.   Patient state that both of her legs have been bothering her for awhile. Hips are the worse with right being most painful. States she has been diagnosed with arthritis to bilateral knees/ankles, but hips have never been checked - reports negative rheumatology workup a few years ago. No other symptoms. She denies any numbness, tingling, radiating pain, low back pain, incontinence. She has noticed that her hips seem to be worse after being on her feet all day (works packing bags, standing on hard flooring all day) and on days when the weather is cool/rainy. Pain is a deep ache, worse by the end of the day. She has trouble getting comfortable in bed and tosses and turns often to pain. She has been taking Tylenol Arthritis with minimal improvement. Reports Goody's powder helped a little bit, but she is scared to take this often. She is willing to try physical therapy.   ROS All review of systems negative except what is listed in the HPI      Objective:    BP 137/65   Pulse 71   Temp 97.7 F (36.5 C) (Oral)   Resp 18   Ht 5\' 3"  (1.6 m)   Wt 202 lb 9.6 oz (91.9 kg)   SpO2 100%   BMI 35.89 kg/m    Physical Exam Vitals reviewed.  Constitutional:      Appearance: Normal appearance.  Cardiovascular:     Rate and Rhythm: Normal rate and regular rhythm.     Pulses: Normal pulses.     Heart sounds: Normal heart sounds.  Pulmonary:     Effort: Pulmonary effort is normal.     Breath sounds: Normal breath sounds.  Musculoskeletal:        General: No swelling or tenderness. Normal range of motion.     Right lower leg: No edema.     Left lower leg: No  edema.  Skin:    General: Skin is warm and dry.  Neurological:     Mental Status: She is alert and oriented to person, place, and time.  Psychiatric:        Mood and Affect: Mood normal.        Behavior: Behavior normal.        Thought Content: Thought content normal.        Judgment: Judgment normal.     No results found for any visits on 06/23/22.      Assessment & Plan:   Problem List Items Addressed This Visit   None Visit Diagnoses     Bilateral hip pain    -  Primary Xray today given duration of symptoms Referral to physical therapy Adding trial of Meloxicam (antiinflammatory - do NOT take with any other antiinflammatories like ibuprofen, Advil, Aleve, etc). Tylenol is okay Try heating pad, staying active, stretching - home exercise handout provided    Relevant Medications   meloxicam (MOBIC) 15 MG tablet   Other Relevant Orders   DG HIP UNILAT W OR W/O PELVIS 2-3 VIEWS RIGHT   DG HIP UNILAT W OR W/O PELVIS  2-3 VIEWS LEFT   Ambulatory referral to Physical Therapy       Meds ordered this encounter  Medications   meloxicam (MOBIC) 15 MG tablet    Sig: Take 1 tablet (15 mg total) by mouth daily.    Dispense:  30 tablet    Refill:  0    Order Specific Question:   Supervising Provider    Answer:   Penni Homans A [[id number]]    Return if symptoms worsen or fail to improve.  Terrilyn Saver, NP

## 2022-06-24 ENCOUNTER — Ambulatory Visit: Payer: BC Managed Care – PPO | Attending: Family Medicine

## 2022-06-24 DIAGNOSIS — M25552 Pain in left hip: Secondary | ICD-10-CM | POA: Diagnosis not present

## 2022-06-24 DIAGNOSIS — M25551 Pain in right hip: Secondary | ICD-10-CM | POA: Insufficient documentation

## 2022-06-24 DIAGNOSIS — M6281 Muscle weakness (generalized): Secondary | ICD-10-CM | POA: Diagnosis not present

## 2022-06-24 NOTE — Therapy (Signed)
OUTPATIENT PHYSICAL THERAPY LOWER EXTREMITY EVALUATION   Patient Name: Amanda [name] MRN: [id number] DOB:01-27-1966, 57 y.o., female Today's Date: 06/24/2022  END OF SESSION:  PT End of Session - 06/24/22 1730     Visit Number 1    Date for PT Re-Evaluation 09/16/22    Authorization Type BC[employer]    PT Start Time 1730    PT Stop Time 1815    PT Time Calculation (min) 45 min    Activity Tolerance Patient tolerated treatment well             Past Medical History:  Diagnosis Date   Allergy    Diverticulitis    GERD (gastroesophageal reflux disease) 1993   Heart murmur    Hypertension 1993   Leaky heart valve    Sleep apnea    Vitamin D deficiency 2011   Past Surgical History:  Procedure Laterality Date   ABDOMINAL HYSTERECTOMY     CHOLECYSTECTOMY     CHOLECYSTECTOMY, LAPAROSCOPIC     TUBAL LIGATION     Patient Active Problem List   Diagnosis Date Noted   Obstructive sleep apnea treated with continuous positive airway pressure (CPAP) 06/01/2018   Pre-diabetes 06/01/2018   Hyperlipidemia with target LDL less than 100 01/06/2018   Stress incontinence of urine 06/26/2017   Encounter for general adult medical examination with abnormal findings 05/20/2017   Non-seasonal allergic rhinitis 04/14/2017   Moderate obstructive sleep apnea 05/20/2016   Recurrent sinus infections 06/12/2014   Chest pain 01/03/2014   GERD (gastroesophageal reflux disease) 03/08/2012   Benign essential hypertension 03/08/2012    PCP: Jodi Mourning  REFERRING PROVIDER: Caleen Jobs  REFERRING DIAG:  [phone] (ICD-10-CM) - Bilateral hip pain      THERAPY DIAG:  No diagnosis found.  Rationale for Evaluation and Treatment: Rehabilitation  ONSET DATE: "8 months"  SUBJECTIVE:   SUBJECTIVE STATEMENT: I got some pain in my legs and hips especially on the right side. It's more when I lay down flat, it keeps me up at night and I have to take stuff. Before it used to bother me  every now and then but now it's every day. I was going to the gym and lost 65lbs, I quit going to the gym because I thought maybe I was doing too much.  PERTINENT HISTORY: Patient state that both of her legs have been bothering her for awhile. Hips are the worse with right being most painful. States she has been diagnosed with arthritis to bilateral knees/ankles, but hips have never been checked - reports negative rheumatology workup a few years ago. No other symptoms. She denies any numbness, tingling, radiating pain, low back pain, incontinence. She has noticed that her hips seem to be worse after being on her feet all day (works packing bags, standing on hard flooring all day) and on days when the weather is cool/rainy. Pain is a deep ache, worse by the end of the day. She has trouble getting comfortable in bed and tosses and turns often to pain. She has been taking Tylenol Arthritis with minimal improvement. Reports Goody's powder helped a little bit, but she is scared to take this often. She is willing to try physical therapy.   PAIN:  Are you having pain? Yes: NPRS scale: right now 0, at worst it is a 10/10 Pain location: both legs, mostly the R hip   Pain description: achy, worse at end of day  Aggravating factors: rainy weather, laying on the R side Relieving factors:  Tylenol, Goody powder a little bit  PRECAUTIONS: None  WEIGHT BEARING RESTRICTIONS: No  FALLS:  Has patient fallen in last 6 months? No  LIVING ENVIRONMENT: Lives with: lives with their family Lives in: House/apartment Stairs: No Has following equipment at home: None  OCCUPATION: 12hr shifts packing bags, has to stand   PLOF: Independent  PATIENT GOALS: stay out of pain    OBJECTIVE:   DIAGNOSTIC FINDINGS: x-rays taken 06/23/22, no results yet   PATIENT SURVEYS:  FOTO 54  COGNITION: Overall cognitive status: Within functional limits for tasks assessed     SENSATION: WFL   MUSCLE LENGTH: Hamstrings:  mild tightness bilaterally   POSTURE: rounded shoulders and increased thoracic kyphosis  PALPATION: No remarkable findings  LOWER EXTREMITY ROM: grossly WFL   LOWER EXTREMITY MMT:  MMT Right eval Left eval  Hip flexion 4+ 5  Hip extension    Hip abduction    Hip adduction    Hip internal rotation 5 5  Hip external rotation 5 5  Knee flexion 4+ 5  Knee extension 5 5  Ankle dorsiflexion    Ankle plantarflexion    Ankle inversion    Ankle eversion     (Blank rows = not tested)  LOWER EXTREMITY SPECIAL TESTS:  Hip special tests: Saralyn Pilar (FABER) test: positive  and Hip scouring test: negative  FUNCTIONAL TESTS:  5 times sit to stand: 10.22 Timed up and go (TUG): 11.78s   TODAY'S TREATMENT:                                                                                                                              DATE: 06/24/22- EVAL    PATIENT EDUCATION:  Education details: HEP and POC Person educated: Patient Education method: Explanation Education comprehension: verbalized understanding  HOME EXERCISE PROGRAM: Access Code: [email or web address] URL: [email or web address] Date: 06/24/2022 Prepared by: Andris Baumann  Exercises - Supine Bridge  - 1 x daily - 7 x weekly - 2 sets - 10 reps - Supine Lower Trunk Rotation  - 1 x daily - 7 x weekly - 2 sets - 10 reps - Clamshell with Resistance  - 1 x daily - 7 x weekly - 3 sets - 10 reps - Seated Hamstring Stretch  - 2 x daily - 7 x weekly - 2 reps - 30 hold  ASSESSMENT:  CLINICAL IMPRESSION: Patient is a 57 y.o. female who was seen today for physical therapy evaluation and treatment for bilateral hip pain. She reports the pain started about 8 months ago and seems to progressively get worse. She was going to the gym regularly up until November due to increase in pain. Today she states she does not have pain but her usual pain levels can get up to a 10/10 especially when laying down to go to bed. She is unable to lay  flat on her back or on her R side. 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 She got X-rays done yesterday on both hips but no results yet. She will benefit from skilled PT to address her hip pain to be able to work and sleep without difficulty. 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   OBJECTIVE IMPAIRMENTS: pain.   REHAB POTENTIAL: Good  CLINICAL DECISION MAKING: Stable/uncomplicated  EVALUATION COMPLEXITY: Low   GOALS: Goals reviewed with patient? Yes  SHORT TERM GOALS: Target date: 08/05/22  Patient will be independent with initial HEP. Goal status: INITIAL   LONG TERM GOALS: Target date: 09/16/22  Patient will be independent with advanced/ongoing HEP to improve outcomes and carryover.  Goal status: INITIAL  2.  Patient will report at least 75% improvement in bilateral hip pain to improve QOL. Baseline: 10/10 at worst Goal status: INITIAL  3.  Patient will be able to get through work day without an increase in pain. Baseline: worse at end of day Goal status: INITIAL  4.  Patient will report 77 on FOTO to demonstrate improved functional ability. Baseline: 54 Goal status: INITIAL  PLAN:  PT FREQUENCY: 1-2x/week  PT DURATION: 12 weeks  PLANNED INTERVENTIONS: Therapeutic exercises, Therapeutic activity, Neuromuscular re-education, Balance training, Gait training, Patient/Family education, Self Care, Joint mobilization, Stair training, Dry Needling, Electrical stimulation, Cryotherapy, Moist heat, Traction, Ionotophoresis 4mg /ml Dexamethasone, and Manual therapy  PLAN FOR NEXT SESSION: NuStep, functional strengthening for hips   Andris Baumann, PT 06/24/2022, 6:13 PM

## 2022-06-28 ENCOUNTER — Other Ambulatory Visit: Payer: Self-pay

## 2022-06-28 ENCOUNTER — Ambulatory Visit: Payer: BC Managed Care – PPO | Attending: Family Medicine

## 2022-06-28 DIAGNOSIS — M25552 Pain in left hip: Secondary | ICD-10-CM | POA: Insufficient documentation

## 2022-06-28 DIAGNOSIS — M6281 Muscle weakness (generalized): Secondary | ICD-10-CM | POA: Insufficient documentation

## 2022-06-28 DIAGNOSIS — M25551 Pain in right hip: Secondary | ICD-10-CM | POA: Diagnosis not present

## 2022-06-28 NOTE — Therapy (Signed)
OUTPATIENT PHYSICAL THERAPY LOWER EXTREMITY EVALUATION   Patient Name: Amanda [name] MRN: [id number] DOB:07/08/1965, 57 y.o., female Today's Date: 06/28/2022  END OF SESSION:  PT End of Session - 06/28/22 0929     Date for PT Re-Evaluation 09/16/22    Authorization Type [employer]    PT Start Time 0930    PT Stop Time 1015    PT Time Calculation (min) 45 min    Activity Tolerance Patient tolerated treatment well    Behavior During Therapy WFL for tasks assessed/performed              Past Medical History:  Diagnosis Date   Allergy    Diverticulitis    GERD (gastroesophageal reflux disease) 1993   Heart murmur    Hypertension 1993   Leaky heart valve    Sleep apnea    Vitamin D deficiency 2011   Past Surgical History:  Procedure Laterality Date   ABDOMINAL HYSTERECTOMY     CHOLECYSTECTOMY     CHOLECYSTECTOMY, LAPAROSCOPIC     TUBAL LIGATION     Patient Active Problem List   Diagnosis Date Noted   Obstructive sleep apnea treated with continuous positive airway pressure (CPAP) 06/01/2018   Pre-diabetes 06/01/2018   Hyperlipidemia with target LDL less than 100 01/06/2018   Stress incontinence of urine 06/26/2017   Encounter for general adult medical examination with abnormal findings 05/20/2017   Non-seasonal allergic rhinitis 04/14/2017   Moderate obstructive sleep apnea 05/20/2016   Recurrent sinus infections 06/12/2014   Chest pain 01/03/2014   GERD (gastroesophageal reflux disease) 03/08/2012   Benign essential hypertension 03/08/2012    PCP: Jodi Mourning  REFERRING PROVIDER: Caleen Jobs  REFERRING DIAG:  [phone] (ICD-10-CM) - Bilateral hip pain      THERAPY DIAG:  Pain of both hip joints  Pain in right hip  Muscle weakness (generalized)  Rationale for Evaluation and Treatment: Rehabilitation  ONSET DATE: "8 months"  SUBJECTIVE:   SUBJECTIVE STATEMENT: I took the new medicine that they prescribed but it made me so sleepy that I  didn't take it the last 2 nights because of working this weekend.  It did help with the pain though.   PERTINENT HISTORY: Patient state that both of her legs have been bothering her for awhile. Hips are the worse with right being most painful. States she has been diagnosed with arthritis to bilateral knees/ankles, but hips have never been checked - reports negative rheumatology workup a few years ago. No other symptoms. She denies any numbness, tingling, radiating pain, low back pain, incontinence. She has noticed that her hips seem to be worse after being on her feet all day (works packing bags, standing on hard flooring all day) and on days when the weather is cool/rainy. Pain is a deep ache, worse by the end of the day. She has trouble getting comfortable in bed and tosses and turns often to pain. She has been taking Tylenol Arthritis with minimal improvement. Reports Goody's powder helped a little bit, but she is scared to take this often. She is willing to try physical therapy.   PAIN:  Are you having pain? Yes: NPRS scale: right now 0, at worst it is a 10/10 Pain location: both legs, mostly the R hip   Pain description: achy, worse at end of day  Aggravating factors: rainy weather, laying on the R side Relieving factors: Tylenol, Goody powder a little bit  PRECAUTIONS: None  WEIGHT BEARING RESTRICTIONS: No  FALLS:  Has patient  fallen in last 6 months? No  LIVING ENVIRONMENT: Lives with: lives with their family Lives in: House/apartment Stairs: No Has following equipment at home: None  OCCUPATION: 12hr shifts packing bags, has to stand   PLOF: Independent  PATIENT GOALS: stay out of pain    OBJECTIVE:   DIAGNOSTIC FINDINGS: x-rays taken 06/23/22, no results yet   PATIENT SURVEYS:  FOTO 54  COGNITION: Overall cognitive status: Within functional limits for tasks assessed     SENSATION: WFL   MUSCLE LENGTH: Hamstrings: mild tightness bilaterally   POSTURE: rounded  shoulders and increased thoracic kyphosis  PALPATION: No remarkable findings  LOWER EXTREMITY ROM: grossly WFL   LOWER EXTREMITY MMT:  MMT Right eval Left eval  Hip flexion 4+ 5  Hip extension    Hip abduction    Hip adduction    Hip internal rotation 5 5  Hip external rotation 5 5  Knee flexion 4+ 5  Knee extension 5 5  Ankle dorsiflexion    Ankle plantarflexion    Ankle inversion    Ankle eversion     (Blank rows = not tested)  LOWER EXTREMITY SPECIAL TESTS:  Hip special tests: Saralyn Pilar (FABER) test: positive  and Hip scouring test: negative  FUNCTIONAL TESTS:  5 times sit to stand: 10.22 Timed up and go (TUG): 11.78s   TODAY'S TREATMENT:                                                                                                                            DATE:  06/28/22 Manual: Supine for mobilization with movement, R hip for Er and IR with lateral/inf glides of R hip joint, 15 reps, 2 sets, with improved ROM for R hip rotation   R sidelying for R lumbar flex/rot/ SB muscle energy 2 sets 6 reps each, patient with provocation of Sx with PA segmental glides R lumbar region  Therapeutic exercise:  Nustep: level 5, LE's only, 5 min Seated hip abd/ER blue t band, 15 reps Seated R hamstring stretch with strap 10 sec holds, 5 reps Supine for bridging 15 reps Prone pillow under abdomen, alternating hip/ knee extension 15 reps   06/24/22- EVAL    PATIENT EDUCATION:  Education details: HEP and POC Person educated: Patient Education method: Explanation Education comprehension: verbalized understanding  HOME EXERCISE PROGRAM: Access Code: [email or web address] URL: [email or web address] Date: 06/28/2022 Prepared by: Hayden Pedro  Exercises - Supine Bridge  - 1 x daily - 7 x weekly - 2 sets - 10 reps - Supine Lower Trunk Rotation  - 1 x daily - 7 x weekly - 2 sets - 10 reps - Clamshell with Resistance  - 1 x daily - 7 x weekly - 3 sets - 10 reps - Seated  Hamstring Stretch  - 2 x daily - 7 x weekly - 2 reps - 30 hold - Seated Hip Abduction with Resistance  - 1 x daily - 4 x weekly - 1 sets - 20  reps Access Code: [email or web address] URL: [email or web address] Date: 06/24/2022 Prepared by: Andris Baumann  Exercises - Supine Bridge  - 1 x daily - 7 x weekly - 2 sets - 10 reps - Supine Lower Trunk Rotation  - 1 x daily - 7 x weekly - 2 sets - 10 reps - Clamshell with Resistance  - 1 x daily - 7 x weekly - 3 sets - 10 reps - Seated Hamstring Stretch  - 2 x daily - 7 x weekly - 2 reps - 30 hold  ASSESSMENT:  CLINICAL IMPRESSION: Patient is a 57 y.o. female who was seen today for physical therapy  treatment for bilateral hip pain.worked her 12 hr shifts all weekend, reports hasn't started the exercises yet.  Today performed manual techniques for R hip jt mobility and R LS facet jt mobility,with additional strengthening ex hip extensors. Tolerated well. Will continue 1 x weekly. OBJECTIVE IMPAIRMENTS: pain.   REHAB POTENTIAL: Good  CLINICAL DECISION MAKING: Stable/uncomplicated  EVALUATION COMPLEXITY: Low   GOALS: Goals reviewed with patient? Yes  SHORT TERM GOALS: Target date: 08/05/22  Patient will be independent with initial HEP. Goal status: IN PROGRESS   LONG TERM GOALS: Target date: 09/16/22  Patient will be independent with advanced/ongoing HEP to improve outcomes and carryover.  Goal status: IN PROGRESS  2.  Patient will report at least 75% improvement in bilateral hip pain to improve QOL. Baseline: 10/10 at worst Goal status: INITIAL  3.  Patient will be able to get through work day without an increase in pain. Baseline: worse at end of day Goal status: INITIAL  4.  Patient will report 84 on FOTO to demonstrate improved functional ability. Baseline: 54 Goal status: INITIAL  PLAN:  PT FREQUENCY: 1-2x/week  PT DURATION: 12 weeks  PLANNED INTERVENTIONS: Therapeutic exercises, Therapeutic activity, Neuromuscular  re-education, Balance training, Gait training, Patient/Family education, Self Care, Joint mobilization, Stair training, Dry Needling, Electrical stimulation, Cryotherapy, Moist heat, Traction, Ionotophoresis 4mg /ml Dexamethasone, and Manual therapy  PLAN FOR NEXT SESSION: NuStep, functional strengthening for hips, how was additional stretching    Ameya Kutz L Zamzam Whinery, PT 06/28/2022, 10:59 AM

## 2022-06-30 ENCOUNTER — Ambulatory Visit: Payer: BC Managed Care – PPO | Admitting: Family Medicine

## 2022-07-07 DIAGNOSIS — L918 Other hypertrophic disorders of the skin: Secondary | ICD-10-CM | POA: Diagnosis not present

## 2022-07-08 ENCOUNTER — Ambulatory Visit: Payer: BC Managed Care – PPO | Admitting: Family

## 2022-07-16 ENCOUNTER — Ambulatory Visit
Admission: RE | Admit: 2022-07-16 | Discharge: 2022-07-16 | Disposition: A | Discharge status: Home / Self Care | Payer: BC Managed Care – PPO | Source: Ambulatory Visit | Attending: Family | Admitting: Family

## 2022-07-16 DIAGNOSIS — Z1231 Encounter for screening mammogram for malignant neoplasm of breast: Secondary | ICD-10-CM

## 2022-07-16 DIAGNOSIS — G4733 Obstructive sleep apnea (adult) (pediatric): Secondary | ICD-10-CM | POA: Diagnosis not present

## 2022-08-03 ENCOUNTER — Encounter: Payer: BC Managed Care – PPO | Admitting: Family

## 2022-08-05 ENCOUNTER — Encounter: Payer: Self-pay | Admitting: Family

## 2022-08-05 ENCOUNTER — Ambulatory Visit (INDEPENDENT_AMBULATORY_CARE_PROVIDER_SITE_OTHER): Payer: BC Managed Care – PPO | Admitting: Family

## 2022-08-05 VITALS — BP 116/78 | HR 62 | Ht 63.0 in | Wt 199.4 lb

## 2022-08-05 DIAGNOSIS — R232 Flushing: Secondary | ICD-10-CM | POA: Diagnosis not present

## 2022-08-05 DIAGNOSIS — M25552 Pain in left hip: Secondary | ICD-10-CM

## 2022-08-05 DIAGNOSIS — R252 Cramp and spasm: Secondary | ICD-10-CM

## 2022-08-05 DIAGNOSIS — Z13220 Encounter for screening for lipoid disorders: Secondary | ICD-10-CM | POA: Diagnosis not present

## 2022-08-05 DIAGNOSIS — R7303 Prediabetes: Secondary | ICD-10-CM

## 2022-08-05 DIAGNOSIS — Z0000 Encounter for general adult medical examination without abnormal findings: Secondary | ICD-10-CM

## 2022-08-05 DIAGNOSIS — E559 Vitamin D deficiency, unspecified: Secondary | ICD-10-CM

## 2022-08-05 DIAGNOSIS — D509 Iron deficiency anemia, unspecified: Secondary | ICD-10-CM

## 2022-08-05 DIAGNOSIS — M25551 Pain in right hip: Secondary | ICD-10-CM

## 2022-08-05 LAB — CBC WITH DIFFERENTIAL/PLATELET
Basophils Absolute: 0 10*3/uL (ref 0.0–0.1)
Basophils Relative: 0.8 % (ref 0.0–3.0)
Eosinophils Absolute: 0.1 10*3/uL (ref 0.0–0.7)
Eosinophils Relative: 1.7 % (ref 0.0–5.0)
HCT: 39.1 % (ref 36.0–46.0)
Hemoglobin: 13.1 g/dL (ref 12.0–15.0)
Lymphocytes Relative: 35.4 % (ref 12.0–46.0)
Lymphs Abs: 2.2 10*3/uL (ref 0.7–4.0)
MCHC: 33.6 g/dL (ref 30.0–36.0)
MCV: 90.1 fl (ref 78.0–100.0)
Monocytes Absolute: 0.4 10*3/uL (ref 0.1–1.0)
Monocytes Relative: 6.5 % (ref 3.0–12.0)
Neutro Abs: 3.4 10*3/uL (ref 1.4–7.7)
Neutrophils Relative %: 55.6 % (ref 43.0–77.0)
Platelets: 253 10*3/uL (ref 150.0–400.0)
RBC: 4.34 Mil/uL (ref 3.87–5.11)
RDW: 14.1 % (ref 11.5–15.5)
WBC: 6.1 10*3/uL (ref 4.0–10.5)

## 2022-08-05 LAB — COMPREHENSIVE METABOLIC PANEL
ALT: 13 U/L (ref 0–35)
AST: 17 U/L (ref 0–37)
Albumin: 4.5 g/dL (ref 3.5–5.2)
Alkaline Phosphatase: 46 U/L (ref 39–117)
BUN: 15 mg/dL (ref 6–23)
CO2: 30 mEq/L (ref 19–32)
Calcium: 9.8 mg/dL (ref 8.4–10.5)
Chloride: 104 mEq/L (ref 96–112)
Creatinine, Ser: 1 mg/dL (ref 0.40–1.20)
GFR: 63.79 mL/min (ref >60.00)
Glucose, Bld: 84 mg/dL (ref 70–99)
Potassium: 3.6 mEq/L (ref 3.5–5.1)
Sodium: 142 mEq/L (ref 135–145)
Total Bilirubin: 0.7 mg/dL (ref 0.2–1.2)
Total Protein: 7.1 g/dL (ref 6.0–8.3)

## 2022-08-05 LAB — LIPID PANEL
Cholesterol: 116 mg/dL (ref 0–200)
HDL: 53.5 mg/dL (ref >39.00)
LDL Cholesterol: 46 mg/dL (ref 0–99)
NonHDL: 62.69
Total CHOL/HDL Ratio: 2
Triglycerides: 81 mg/dL (ref 0.0–149.0)
VLDL: 16.2 mg/dL (ref 0.0–40.0)

## 2022-08-05 LAB — HEMOGLOBIN A1C: Hgb A1c MFr Bld: 5.6 % (ref 4.6–6.5)

## 2022-08-05 LAB — TSH: TSH: 1.87 u[IU]/mL (ref 0.35–5.50)

## 2022-08-05 LAB — LUTEINIZING HORMONE: LH: 17.65 m[IU]/mL

## 2022-08-05 LAB — VITAMIN B12: Vitamin B-12: 173 pg/mL — ABNORMAL LOW (ref 211–911)

## 2022-08-05 LAB — FOLLICLE STIMULATING HORMONE: FSH: 37.6 m[IU]/mL

## 2022-08-05 LAB — VITAMIN D 25 HYDROXY (VIT D DEFICIENCY, FRACTURES): VITD: 38.13 ng/mL (ref 30.00–100.00)

## 2022-08-05 LAB — MAGNESIUM: Magnesium: 1.9 mg/dL (ref 1.5–2.5)

## 2022-08-05 MED ORDER — MELOXICAM 15 MG PO TABS
15.0000 mg | ORAL_TABLET | Freq: Every day | ORAL | 0 refills | Status: DC
Start: 2022-08-05 — End: 2022-10-28

## 2022-08-05 NOTE — Progress Notes (Signed)
Amanda [name] is a 57 y.o. female with the following history as recorded in [facility]:  Patient Active Problem List   Diagnosis Date Noted   Obstructive sleep apnea treated with continuous positive airway pressure (CPAP) 06/01/2018   Pre-diabetes 06/01/2018   Hyperlipidemia with target LDL less than 100 01/06/2018   Stress incontinence of urine 06/26/2017   Encounter for general adult medical examination with abnormal findings 05/20/2017   Non-seasonal allergic rhinitis 04/14/2017   Moderate obstructive sleep apnea 05/20/2016   Recurrent sinus infections 06/12/2014   Chest pain 01/03/2014   GERD (gastroesophageal reflux disease) 03/08/2012   Benign essential hypertension 03/08/2012    Current Outpatient Medications  Medication Sig Dispense Refill   aspirin EC 81 MG tablet Take 81 mg by mouth daily. Swallow whole.     blood glucose meter kit and supplies KIT Dispense based on patient and insurance preference. Use up to four times daily as directed. (FOR ICD-9 250.00, 250.01). 1 each 0   cetirizine (ZYRTEC) 10 MG tablet Take 1 tablet (10 mg total) by mouth daily. 90 tablet 3   cholecalciferol (VITAMIN D3) 25 MCG (1000 UNIT) tablet Take 1,000 Units by mouth daily.     isosorbide mononitrate (IMDUR) 30 MG 24 hr tablet Take 1 tablet (30 mg total) by mouth daily. 90 tablet 3   losartan (COZAAR) 100 MG tablet TAKE 1 TABLET DAILY 90 tablet 3   metFORMIN (GLUCOPHAGE) 500 MG tablet TAKE 1 TABLET DAILY AFTER SUPPER 90 tablet 3   metoprolol tartrate (LOPRESSOR) 25 MG tablet Take 1 tablet (25 mg total) by mouth 2 (two) times daily. 180 tablet 3   Multiple Vitamins-Minerals (PRESERVISION AREDS 2) CAPS Take by mouth.     ONETOUCH VERIO test strip USE UP TO FOUR TIMES A DAY AS DIRECTED 100 strip 13   pantoprazole (PROTONIX) 40 MG tablet Take 1 tablet (40 mg total) by mouth 2 (two) times daily. 180 tablet 3   rosuvastatin (CRESTOR) 10 MG tablet TAKE 1 TABLET DAILY 90 tablet 3   meloxicam (MOBIC)  15 MG tablet Take 1 tablet (15 mg total) by mouth daily. 90 tablet 0   nitroGLYCERIN (NITROSTAT) 0.4 MG SL tablet Place 1 tablet (0.4 mg total) under the tongue every 5 (five) minutes as needed for chest pain. 25 tablet 3   No current facility-administered medications for this visit.    Allergies: Patient has no known allergies.  Past Medical History:  Diagnosis Date   Allergy    Diverticulitis    GERD (gastroesophageal reflux disease) 1993   Heart murmur    Hypertension 1993   Leaky heart valve    Sleep apnea    Vitamin D deficiency 2011    Past Surgical History:  Procedure Laterality Date   ABDOMINAL HYSTERECTOMY     CHOLECYSTECTOMY     CHOLECYSTECTOMY, LAPAROSCOPIC     TUBAL LIGATION      Family History  Problem Relation Age of Onset   Hypertension Mother    Lupus Mother    Hypertension Father    Diabetes Father    Heart disease Father    Prostate cancer Father    Asthma Brother    Asthma Sister    Cancer Sister        breast cancer    Breast cancer Sister 32   Diabetes Sister    Diabetes Brother    Hypertension Sister    Hypertension Brother     Social History   Tobacco Use   Smoking  status: Never   Smokeless tobacco: Never  Substance Use Topics   Alcohol use: No    Subjective:   Presents for yearly CPE; has lost 50+ pounds in the past year- doing [employer] online; was seen with bilateral hip pain but still having bilateral leg pain- pain more noticeable at night;   Review of Systems  Constitutional: Negative.   HENT: Negative.    Eyes: Negative.   Respiratory: Negative.    Cardiovascular: Negative.   Gastrointestinal: Negative.   Genitourinary: Negative.   Musculoskeletal:  Positive for joint pain and myalgias.  Skin: Negative.   Neurological: Negative.   Endo/Heme/Allergies: Negative.   Psychiatric/Behavioral: Negative.        Objective:  Vitals:   08/05/22 0856  BP: 116/78  Pulse: 62  SpO2: 100%  Weight: 199 lb 6.4 oz (90.4 kg)   Height: 5\' 3"  (1.6 m)    General: Well developed, well nourished, in no acute distress  Skin : Warm and dry.  Head: Normocephalic and atraumatic  Eyes: Sclera and conjunctiva clear; pupils round and reactive to light; extraocular movements intact  Ears: External normal; canals clear; tympanic membranes normal  Oropharynx: Pink, supple. No suspicious lesions  Neck: Supple without thyromegaly, adenopathy  Lungs: Respirations unlabored; clear to auscultation bilaterally without wheeze, rales, rhonchi  CVS exam: normal rate and regular rhythm.  Abdomen: Soft; nontender; nondistended; normoactive bowel sounds; no masses or hepatosplenomegaly  Musculoskeletal: No deformities; no active joint inflammation  Extremities: No edema, cyanosis, clubbing  Vessels: Symmetric bilaterally  Neurologic: Alert and oriented; speech intact; face symmetrical; moves all extremities well; CNII-XII intact without focal deficit   Assessment:  1. PE (physical exam), annual   2. Leg cramps   3. Vitamin D deficiency   4. Pre-diabetes   5. Iron deficiency anemia, unspecified iron deficiency anemia type   6. Lipid screening   7. Bilateral hip pain   8. Hot flashes     Plan:   Age appropriate preventive healthcare needs addressed; encouraged regular eye doctor and dental exams; encouraged regular exercise; will update labs and refills as needed today; follow-up to be determined; Congratulated patient on her commitment to her health and weight loss; Discussed suspicion that B12 level is low secondary to use of Metformin and this may be causing leg pains; follow up to be determined;     No follow-ups on file.  Orders Placed This Encounter  Procedures   B12   Vitamin D (25 hydroxy)   Magnesium   Hemoglobin A1c   Iron, TIBC and Ferritin Panel   CBC with Differential/Platelet   Comp Met (CMET)   Lipid panel   TSH   FSH   LH    Requested Prescriptions   Signed Prescriptions Disp Refills   meloxicam  (MOBIC) 15 MG tablet 90 tablet 0    Sig: Take 1 tablet (15 mg total) by mouth daily.

## 2022-08-06 LAB — IRON,TIBC AND FERRITIN PANEL
%SAT: 25 % (calc) (ref 16–45)
Ferritin: 38 ng/mL (ref 16–232)
Iron: 95 ug/dL (ref 45–160)
TIBC: 382 mcg/dL (calc) (ref 250–450)

## 2022-08-09 ENCOUNTER — Ambulatory Visit (INDEPENDENT_AMBULATORY_CARE_PROVIDER_SITE_OTHER): Payer: BC Managed Care – PPO

## 2022-08-09 ENCOUNTER — Other Ambulatory Visit: Payer: Self-pay | Admitting: Cardiovascular Disease

## 2022-08-09 ENCOUNTER — Ambulatory Visit: Payer: BC Managed Care – PPO | Admitting: Podiatry

## 2022-08-09 DIAGNOSIS — M778 Other enthesopathies, not elsewhere classified: Secondary | ICD-10-CM

## 2022-08-09 DIAGNOSIS — M722 Plantar fascial fibromatosis: Secondary | ICD-10-CM | POA: Diagnosis not present

## 2022-08-09 NOTE — Progress Notes (Signed)
  Subjective:  Patient ID: Amanda [name], female    DOB: 1965/04/07,  MRN: [id number]  Chief Complaint  Patient presents with   Foot Pain    Right heel pain. Does not radiate. Pain is worst in the morning and pain comes and goes.     57 y.o. female presents with the above complaint.  Patient presents with pain in the right heel.  Says it hurts more after she gets out of bed in the morning.  She has previously been diagnosed with heel spur and plantar fasciitis.  Had injection many years ago and said it helped a lot with the pain.   Review of Systems: Negative except as noted in the HPI. Denies N/V/F/Ch.   Objective:  There were no vitals filed for this visit. There is no height or weight on file to calculate BMI. Constitutional Well developed. Well nourished.  Vascular Dorsalis pedis pulses palpable bilaterally. Posterior tibial pulses palpable bilaterally. Capillary refill normal to all digits.  No cyanosis or clubbing noted. Pedal hair growth normal.  Neurologic Normal speech. Oriented to person, place, and time. Epicritic sensation to light touch grossly present bilaterally.  Dermatologic Nails well groomed and normal in appearance. No open wounds. No skin lesions.  Orthopedic: Normal joint ROM without pain or crepitus bilaterally. No visible deformities. Tender to palpation at the calcaneal tuber right. No pain with calcaneal squeeze right. Ankle ROM diminished range of motion right. Silfverskiold Test: negative right.   Radiographs: Taken and reviewed. No acute fractures or dislocations. No evidence of stress fracture.  Plantar heel spur present. Posterior heel spur present.   Assessment:   1. Plantar fasciitis, right   2. Capsulitis of foot, right    Plan:  Patient was evaluated and treated and all questions answered.  Plantar Fasciitis, right - XR reviewed as above.  - Educated on icing and stretching. Instructions given.  - Injection delivered to the  plantar fascia as below. - DME: [facility] will consider at next visit - Pharmacologic management: Meloxicam 15 mg take once daily for the next 30 days. Educated on risks/benefits and proper taking of medication.  Procedure: Injection Tendon/Ligament Location: Right plantar fascia at the glabrous junction; medial approach. Skin Prep: alcohol Injectate: 1 cc 0.5% marcaine plain, 1 cc kenalog 10. Disposition: Patient tolerated procedure well. Injection site dressed with a band-aid.  Return in about 4 weeks (around 09/06/2022) for F/u R PF.

## 2022-08-09 NOTE — Patient Instructions (Signed)

## 2022-08-13 ENCOUNTER — Ambulatory Visit (INDEPENDENT_AMBULATORY_CARE_PROVIDER_SITE_OTHER): Payer: BC Managed Care – PPO

## 2022-08-13 DIAGNOSIS — E538 Deficiency of other specified B group vitamins: Secondary | ICD-10-CM | POA: Diagnosis not present

## 2022-08-13 MED ORDER — CYANOCOBALAMIN 1000 MCG/ML IJ SOLN
1000.0000 ug | Freq: Once | INTRAMUSCULAR | Status: AC
Start: 2022-08-13 — End: 2022-08-13
  Administered 2022-08-13: 1000 ug via INTRAMUSCULAR

## 2022-08-13 NOTE — Progress Notes (Signed)
Amanda [name] is a 57 y.o. female presents to the office today for 1/4 B12 injection, per physician's orders. Original order: 08/05/22: "As we discussed, your B12 level is low and I think this is contributing to the leg pain. We typically recommend that you come get weekly shots x 4 weeks to start treatment. This would be done as a nurse visit. " B12 1000 mcg IM (med) was administered L Deltoid today. Patient tolerated injection. Patient due for follow up labs/provider appt: No Patient next injection due: 1 week , appt made Yes  Creft, Melton Alar L

## 2022-08-15 DIAGNOSIS — G4733 Obstructive sleep apnea (adult) (pediatric): Secondary | ICD-10-CM | POA: Diagnosis not present

## 2022-08-19 ENCOUNTER — Ambulatory Visit (INDEPENDENT_AMBULATORY_CARE_PROVIDER_SITE_OTHER): Payer: BC Managed Care – PPO

## 2022-08-19 DIAGNOSIS — E538 Deficiency of other specified B group vitamins: Secondary | ICD-10-CM | POA: Diagnosis not present

## 2022-08-19 MED ORDER — CYANOCOBALAMIN 1000 MCG/ML IJ SOLN
1000.0000 ug | Freq: Once | INTRAMUSCULAR | Status: AC
Start: 2022-08-19 — End: 2022-08-19
  Administered 2022-08-19: 1000 ug via INTRAMUSCULAR

## 2022-08-19 NOTE — Progress Notes (Signed)
Amanda [name] is a 57 y.o. female presents to the office today for 2/4 weekly B12 injection, per physician's orders.  Original order: 08/05/22: "As we discussed, your B12 level is low and I think this is contributing to the leg pain. We typically recommend that you come get weekly shots x 4 weeks to start treatment. This would be done as a nurse visit. "  B12 1000 mcg IM (med) was administered R Deltoid today. Patient tolerated injection. Patient due for follow up labs/provider appt: No Patient next injection due: 1 week , appt made Yes for 08/26/2022 for b 12 # 3 of 4 weekly.

## 2022-08-26 ENCOUNTER — Encounter: Payer: Self-pay | Admitting: Family

## 2022-08-26 ENCOUNTER — Ambulatory Visit (INDEPENDENT_AMBULATORY_CARE_PROVIDER_SITE_OTHER): Payer: BC Managed Care – PPO | Admitting: Neurology

## 2022-08-26 DIAGNOSIS — E538 Deficiency of other specified B group vitamins: Secondary | ICD-10-CM

## 2022-08-26 MED ORDER — CYANOCOBALAMIN 1000 MCG/ML IJ SOLN
1000.0000 ug | Freq: Once | INTRAMUSCULAR | Status: AC
Start: 2022-08-26 — End: 2022-08-26
  Administered 2022-08-26: 1000 ug via INTRAMUSCULAR

## 2022-08-26 NOTE — Progress Notes (Signed)
Pt here for monthly B12 injection number 3 of 4 per Vernona Rieger.  Original order: 08/05/22: "As we discussed, your B12 level is low and I think this is contributing to the leg pain. We typically recommend that you come get weekly shots x 4 weeks to start treatment. This would be done as a nurse visit. "   Denies gastrointestinal problems or dizziness.   B12 injection to left deltoid with no apparent complications.   Patient scheduled for next injection 09/02/2022 for 4 of 4 weekly injections.

## 2022-08-30 ENCOUNTER — Other Ambulatory Visit: Payer: Self-pay | Admitting: Family

## 2022-08-30 ENCOUNTER — Encounter: Payer: Self-pay | Admitting: Family

## 2022-08-30 DIAGNOSIS — I10 Essential (primary) hypertension: Secondary | ICD-10-CM

## 2022-08-30 MED ORDER — LOSARTAN POTASSIUM 100 MG PO TABS
100.0000 mg | ORAL_TABLET | Freq: Every day | ORAL | 1 refills | Status: DC
Start: 2022-08-30 — End: 2022-09-07

## 2022-08-30 MED ORDER — LOSARTAN POTASSIUM 100 MG PO TABS: 100.0000 mg | ORAL_TABLET | Freq: Every day | ORAL | 3 refills | Status: DC

## 2022-09-02 ENCOUNTER — Ambulatory Visit (INDEPENDENT_AMBULATORY_CARE_PROVIDER_SITE_OTHER): Payer: BC Managed Care – PPO | Admitting: *Deleted

## 2022-09-02 DIAGNOSIS — E538 Deficiency of other specified B group vitamins: Secondary | ICD-10-CM

## 2022-09-02 MED ORDER — CYANOCOBALAMIN 1000 MCG/ML IJ SOLN
1000.0000 ug | Freq: Once | INTRAMUSCULAR | Status: AC
  Administered 2022-09-02: 1000 ug via INTRAMUSCULAR

## 2022-09-02 NOTE — Progress Notes (Signed)
Patient here for 4/4 weekly b12 injection per physicians order.  Injection given in left deltoid and patient tolerated well.  Patient stated that she is still having the leg pain, but will discuss at next visit on 09/16/22.

## 2022-09-06 ENCOUNTER — Ambulatory Visit (INDEPENDENT_AMBULATORY_CARE_PROVIDER_SITE_OTHER): Payer: BC Managed Care – PPO | Admitting: Podiatry

## 2022-09-06 DIAGNOSIS — Z91199 Patient's noncompliance with other medical treatment and regimen due to unspecified reason: Secondary | ICD-10-CM

## 2022-09-06 NOTE — Progress Notes (Signed)
Pt was a no show for apt 

## 2022-09-07 ENCOUNTER — Other Ambulatory Visit: Payer: Self-pay | Admitting: Family

## 2022-09-07 DIAGNOSIS — I10 Essential (primary) hypertension: Secondary | ICD-10-CM

## 2022-09-07 MED ORDER — LOSARTAN POTASSIUM 100 MG PO TABS
100.0000 mg | ORAL_TABLET | Freq: Every day | ORAL | 3 refills | Status: DC
Start: 2022-09-07 — End: 2023-05-02

## 2022-09-09 ENCOUNTER — Other Ambulatory Visit: Payer: Self-pay | Admitting: Family

## 2022-09-15 DIAGNOSIS — G4733 Obstructive sleep apnea (adult) (pediatric): Secondary | ICD-10-CM | POA: Diagnosis not present

## 2022-09-16 ENCOUNTER — Ambulatory Visit: Payer: BC Managed Care – PPO | Admitting: Family

## 2022-09-16 ENCOUNTER — Encounter: Payer: Self-pay | Admitting: Family

## 2022-09-16 VITALS — BP 126/72 | HR 59 | Ht 63.0 in | Wt 214.0 lb

## 2022-09-16 DIAGNOSIS — R232 Flushing: Secondary | ICD-10-CM

## 2022-09-16 DIAGNOSIS — R7989 Other specified abnormal findings of blood chemistry: Secondary | ICD-10-CM | POA: Diagnosis not present

## 2022-09-16 LAB — VITAMIN B12: Vitamin B-12: 438 pg/mL (ref 211–911)

## 2022-09-16 MED ORDER — VENLAFAXINE HCL ER 37.5 MG PO CP24: 37.5000 mg | ORAL_CAPSULE | Freq: Every day | ORAL | 0 refills | Status: DC

## 2022-09-16 NOTE — Patient Instructions (Addendum)
Please start taking 1000 mcg of B12 daily for maintenance;   Please let me hear from you regarding your response to Effexor XR for the hot flashes;

## 2022-09-16 NOTE — Progress Notes (Signed)
Amanda [name] is a 57 y.o. female with the following history as recorded in Epic[facility]:  Patient Active Problem List   Diagnosis Date Noted   Obstructive sleep apnea treated with continuous positive airway pressure (CPAP) 06/01/2018   Pre-diabetes 06/01/2018   Hyperlipidemia with target LDL less than 100 01/06/2018   Stress incontinence of urine 06/26/2017   Encounter for general adult medical examination with abnormal findings 05/20/2017   Non-seasonal allergic rhinitis 04/14/2017   Moderate obstructive sleep apnea 05/20/2016   Recurrent sinus infections 06/12/2014   Chest pain 01/03/2014   GERD (gastroesophageal reflux disease) 03/08/2012   Benign essential hypertension 03/08/2012    Current Outpatient Medications  Medication Sig Dispense Refill   aspirin EC 81 MG tablet Take 81 mg by mouth daily. Swallow whole.     blood glucose meter kit and supplies KIT Dispense based on patient and insurance preference. Use up to four times daily as directed. (FOR ICD-9 250.00, 250.01). 1 each 0   cetirizine (ZYRTEC) 10 MG tablet Take 1 tablet (10 mg total) by mouth daily. 90 tablet 3   cholecalciferol (VITAMIN D3) 25 MCG (1000 UNIT) tablet Take 1,000 Units by mouth daily.     isosorbide mononitrate (IMDUR) 30 MG 24 hr tablet TAKE 1 TABLET DAILY 90 tablet 0   losartan (COZAAR) 100 MG tablet Take 1 tablet (100 mg total) by mouth daily. 90 tablet 3   meloxicam (MOBIC) 15 MG tablet Take 1 tablet (15 mg total) by mouth daily. 90 tablet 0   metFORMIN (GLUCOPHAGE) 500 MG tablet TAKE 1 TABLET DAILY AFTER SUPPER 90 tablet 3   metoprolol tartrate (LOPRESSOR) 25 MG tablet TAKE 1 TABLET TWICE A DAY 180 tablet 0   Multiple Vitamins-Minerals (PRESERVISION AREDS 2) CAPS Take by mouth.     ONETOUCH VERIO test strip USE UP TO FOUR TIMES A DAY AS DIRECTED 100 strip 13   pantoprazole (PROTONIX) 40 MG tablet Take 1 tablet (40 mg total) by mouth 2 (two) times daily. 180 tablet 1   rosuvastatin (CRESTOR) 10 MG  tablet TAKE 1 TABLET DAILY 90 tablet 3   venlafaxine XR (EFFEXOR XR) 37.5 MG 24 hr capsule Take 1 capsule (37.5 mg total) by mouth daily with breakfast. 30 capsule 0   nitroGLYCERIN (NITROSTAT) 0.4 MG SL tablet Place 1 tablet (0.4 mg total) under the tongue every 5 (five) minutes as needed for chest pain. 25 tablet 3   No current facility-administered medications for this visit.    Allergies: Patient has no known allergies.  Past Medical History:  Diagnosis Date   Allergy    Diverticulitis    GERD (gastroesophageal reflux disease) 1993   Heart murmur    Hypertension 1993   Leaky heart valve    Sleep apnea    Vitamin D deficiency 2011    Past Surgical History:  Procedure Laterality Date   ABDOMINAL HYSTERECTOMY     CHOLECYSTECTOMY     CHOLECYSTECTOMY, LAPAROSCOPIC     TUBAL LIGATION      Family History  Problem Relation Age of Onset   Hypertension Mother    Lupus Mother    Hypertension Father    Diabetes Father    Heart disease Father    Prostate cancer Father    Asthma Brother    Asthma Sister    Cancer Sister        breast cancer    Breast cancer Sister 39   Diabetes Sister    Diabetes Brother    Hypertension  Sister    Hypertension Brother     Social History   Tobacco Use   Smoking status: Never   Smokeless tobacco: Never  Substance Use Topics   Alcohol use: No    Subjective:   Patient is seen for follow up on low B12 level- has done 4 weeks of injections; has been feeling better; leg pain is improving/ energy level is better- knows that she needs to get back to the gym;  Still struggling with hot flashes- prefers non hormonal treatment due to cardiac history;    Objective:  Vitals:   09/16/22 0831  BP: 126/72  Pulse: (!) 59  SpO2: 99%  Weight: 214 lb (97.1 kg)  Height: 5\' 3"  (1.6 m)    General: Well developed, well nourished, in no acute distress  Skin : Warm and dry.  Head: Normocephalic and atraumatic  Eyes: Sclera and conjunctiva clear; pupils  round and reactive to light; extraocular movements intact  Ears: External normal; canals clear; tympanic membranes normal  Oropharynx: Pink, supple. No suspicious lesions  Neck: Supple without thyromegaly, adenopathy  Lungs: Respirations unlabored; clear to auscultation bilaterally without wheeze, rales, rhonchi  CVS exam: normal rate and regular rhythm.  Neurologic: Alert and oriented; speech intact; face symmetrical; moves all extremities well; CNII-XII intact without focal deficit   Assessment:  1. Low vitamin B12 level   2. Hot flashes     Plan:  Responding to treatment; check B12 level today; start taking oral medication;  Trial of Effexor XR 37.5 mg daily; she will call back with response in 1 month- to consider increasing dosage;   No follow-ups on file.  Orders Placed This Encounter  Procedures   B12    Requested Prescriptions   Signed Prescriptions Disp Refills   venlafaxine XR (EFFEXOR XR) 37.5 MG 24 hr capsule 30 capsule 0    Sig: Take 1 capsule (37.5 mg total) by mouth daily with breakfast.

## 2022-09-20 DIAGNOSIS — R059 Cough, unspecified: Secondary | ICD-10-CM | POA: Diagnosis not present

## 2022-09-20 DIAGNOSIS — H6122 Impacted cerumen, left ear: Secondary | ICD-10-CM | POA: Diagnosis not present

## 2022-09-20 DIAGNOSIS — J40 Bronchitis, not specified as acute or chronic: Secondary | ICD-10-CM | POA: Diagnosis not present

## 2022-09-20 DIAGNOSIS — R0981 Nasal congestion: Secondary | ICD-10-CM | POA: Diagnosis not present

## 2022-09-27 ENCOUNTER — Other Ambulatory Visit: Payer: Self-pay | Admitting: Family

## 2022-10-04 ENCOUNTER — Encounter: Payer: Self-pay | Admitting: Family

## 2022-10-05 ENCOUNTER — Other Ambulatory Visit: Payer: Self-pay | Admitting: Family

## 2022-10-05 DIAGNOSIS — M545 Low back pain, unspecified: Secondary | ICD-10-CM

## 2022-10-05 MED ORDER — GABAPENTIN 100 MG PO CAPS: 200.0000 mg | ORAL_CAPSULE | Freq: Every day | ORAL | 0 refills | Status: DC

## 2022-10-13 ENCOUNTER — Ambulatory Visit: Payer: BC Managed Care – PPO | Admitting: Family Medicine

## 2022-10-13 ENCOUNTER — Ambulatory Visit (INDEPENDENT_AMBULATORY_CARE_PROVIDER_SITE_OTHER)
Admission: RE | Admit: 2022-10-13 | Discharge: 2022-10-13 | Disposition: A | Discharge status: Home / Self Care | Payer: BC Managed Care – PPO | Source: Ambulatory Visit | Attending: Family | Admitting: Family

## 2022-10-13 ENCOUNTER — Encounter: Payer: Self-pay | Admitting: Family Medicine

## 2022-10-13 VITALS — BP 128/68 | HR 57 | Ht 62.0 in | Wt 226.0 lb

## 2022-10-13 DIAGNOSIS — G4733 Obstructive sleep apnea (adult) (pediatric): Secondary | ICD-10-CM

## 2022-10-13 DIAGNOSIS — M79604 Pain in right leg: Secondary | ICD-10-CM

## 2022-10-13 DIAGNOSIS — M79605 Pain in left leg: Secondary | ICD-10-CM

## 2022-10-13 DIAGNOSIS — M545 Low back pain, unspecified: Secondary | ICD-10-CM

## 2022-10-13 DIAGNOSIS — M47816 Spondylosis without myelopathy or radiculopathy, lumbar region: Secondary | ICD-10-CM | POA: Diagnosis not present

## 2022-10-13 NOTE — Patient Instructions (Signed)

## 2022-10-13 NOTE — Progress Notes (Signed)
PATIENT: Amanda [name] DOB: 04/24/1965  REASON FOR VISIT: follow up HISTORY FROM: patient  Chief Complaint  Patient presents with   Follow-up    Pt in room 2, granddaughter in room. Her for cpap follow up. Pt reports doing well on cpap, wearing at least 4 hours. Pt has questions about the mouth guard.      HISTORY OF PRESENT ILLNESS:  10/13/22 ALL:  Amanda [name] returns for follow up for OSA on CPAP. She is doing fairly well. She does not love using CPAP but recognizes medical benefit. She has been curious about trying an oral appliance. She has lost about 30lbs since last visit. She was down to 65lb weight loss but had trouble with hip pain and unable to continue exercise. She has recently gotten hip pain under control and wishes to resume exercise. HST 2018 showed AHI 13.8/h and O2 nadir 72%. ONO showed resolution of hypoxia on CPAP.     06/15/2021 ALL: Amanda [name] returns for follow up for OSA on CPAP. She continues to do well on therapy. She is using her machine every night. She has been a little more tired and sleepy during the day. She was recently evaluated by cardiology for concerns of chest pain and shob. Dr Melburn Popper noted a murmur and has scheduled ECHO and CT. She was started on metoprolol 25mg  BID and Imdur 30mg  daily. She has follow up with PCP next week.     06/12/2020 ALL:  She returns for follow up for OSA on CPAP. She is doing very well. She was diagnosed with Covid in 03/2020 and treated for PNA 05/26/2020. She had a period of about 3-4 days where she could not use her CPAP due to coughing and felt that she did not sleep well at all. She does report feeling significantly better on CPAP therapy. No concerns with machine or supplies.       06/13/2019 ALL:  Amanda [name] is a 57 y.o. female here today for follow up for OSA on CPAP therapy. Overall, she is doing well. She does endorse more fatigue and headaches recently. She wakes up at 3 days a week feeling  exhausted. She reports having a mild headache about once weekly, frontal and throbbing. No migraine symptoms or vision changes. Headache resolves with Tylenol. BP has been slightly elevated. She was seen by cardiology and started on chlorthalidone 12.5mg  daily and potassium supplements. She has also noted significant hair loss that started about 2 years ago. She had labs with PCP that were normal. She was told it was related to stress. She had to place parents in nursing homes in the recent years. Her father died in in 09-29-2017.  She is working 12 hour shifts at work. She tries to drink 3 bottles of water daily.   Compliance report dated 05/13/2019 through 06/11/2019 reveals that she has used CPAP 30 of the last 30 days for compliance of 100%.  She used CPAP greater than 4 hours all 30 days for compliance 100%.  Average usage was 7 hours and 45 minutes.  Residual AHI was 1.5 on 12 cm of water and an EPR of 3.  There were no significant leaks noted.  HISTORY: (copied from Sturgeon Martin's note on 06/01/2018)  Amanda [name] is a 57 year old right-handed woman with an underlying medical history of hypertension, allergies, reflux disease, vitamin D deficiency, and morbid obesity, who presents for follow-up consultation of her sleep disorder, after home sleep study testing and trial of AutoPap therapy.  The patient is unaccompanied today. I last saw her on 08/10/2016, at which time she reported doing much better after starting AutoPap therapy. She felt that her sleep quality and sleep consolidation as well as daytime energy were improved as well as her morning headaches. She was motivated to work on her weight loss and continue with AutoPap. I suggested we proceed with an overnight pulse oximetry test to monitor oxygen saturations for 1 night. She had a pulse ox on 08/24/2016 with a total duration of 5 hours and 56 minutes, average oxygen saturation of 97.3%, nadir of 92%.   Today, 02/10/2017: I reviewed her AutoPap  compliance data from 01/10/2017 through 02/08/2017 which is a total of 30 days, during which time she used her machine every night with percent used days greater than 4 hours at 90%, indicating excellent compliance with an average usage of 7 hours and 34 minutes, residual AHI at goal at 1.6 per hour, pressure for the 95th percentile right at 12 cm, leak very low, pressure setting of 4-13 cm with EPR. She reports doing well, will start going to the gym. Had a bout of bronchitis, went to UC and had a Zpack. Could not use the machine then. Otherwise doing well. Would be okay trying CPAP of 12 cm, using nasal mask.   UPDATE 3/5/2020CM Amanda [name], 57 year old female returns for follow-up with history of obstructive sleep apnea here for CPAP compliance.  She is doing well with her CPAP.  She claims she has more sleepiness lately.  She was recently switched from an 8-hour shift to a 12-hour shift and she is still adjusting to this.  CPAP data dated 05/01/2018-05/30/2018 shows compliance greater than 4 hours at 83%.  Average usage 7 hours 19 minutes.  Set pressure 12 cm leak 95th percentile 0.9.  AHI 0.9.  The other 5 days she did not use the machine she had a cold.  She returns for reevaluation   REVIEW OF SYSTEMS: Out of a complete 14 system review of symptoms, the patient complains only of the following symptoms, chest pain, shob, dizziness, and all other reviewed systems are negative.   ESS: 9/24, previously 13/24   ALLERGIES: No Known Allergies  HOME MEDICATIONS: Outpatient Medications Prior to Visit  Medication Sig Dispense Refill   aspirin EC 81 MG tablet Take 81 mg by mouth daily. Swallow whole.     blood glucose meter kit and supplies KIT Dispense based on patient and insurance preference. Use up to four times daily as directed. (FOR ICD-9 250.00, 250.01). 1 each 0   cetirizine (ZYRTEC) 10 MG tablet Take 1 tablet (10 mg total) by mouth daily. 90 tablet 3   cholecalciferol (VITAMIN D3) 25 MCG  (1000 UNIT) tablet Take 1,000 Units by mouth daily.     gabapentin (NEURONTIN) 100 MG capsule Take 2 capsules (200 mg total) by mouth at bedtime. 90 capsule 0   isosorbide mononitrate (IMDUR) 30 MG 24 hr tablet TAKE 1 TABLET DAILY 90 tablet 0   losartan (COZAAR) 100 MG tablet Take 1 tablet (100 mg total) by mouth daily. 90 tablet 3   meloxicam (MOBIC) 15 MG tablet Take 1 tablet (15 mg total) by mouth daily. 90 tablet 0   metFORMIN (GLUCOPHAGE) 500 MG tablet TAKE 1 TABLET DAILY AFTER SUPPER 90 tablet 3   Methylcobalamin (B12-ACTIVE PO) Take by mouth. 1 tablet per day [facility]     metoprolol tartrate (LOPRESSOR) 25 MG tablet TAKE 1 TABLET TWICE A DAY 180 tablet 0  Multiple Vitamins-Minerals (PRESERVISION AREDS 2) CAPS Take by mouth.     ONETOUCH VERIO test strip USE UP TO FOUR TIMES A DAY AS DIRECTED 100 strip 13   pantoprazole (PROTONIX) 40 MG tablet Take 1 tablet (40 mg total) by mouth 2 (two) times daily. 180 tablet 1   rosuvastatin (CRESTOR) 10 MG tablet TAKE 1 TABLET DAILY 90 tablet 3   venlafaxine XR (EFFEXOR XR) 37.5 MG 24 hr capsule Take 1 capsule (37.5 mg total) by mouth daily with breakfast. 30 capsule 0   nitroGLYCERIN (NITROSTAT) 0.4 MG SL tablet Place 1 tablet (0.4 mg total) under the tongue every 5 (five) minutes as needed for chest pain. 25 tablet 3   No facility-administered medications prior to visit.    PAST MEDICAL HISTORY: Past Medical History:  Diagnosis Date   Allergy    Diverticulitis    GERD (gastroesophageal reflux disease) 1993   Heart murmur    Hypertension 1993   Leaky heart valve    Sleep apnea    Vitamin D deficiency 2011    PAST SURGICAL HISTORY: Past Surgical History:  Procedure Laterality Date   ABDOMINAL HYSTERECTOMY     CHOLECYSTECTOMY     CHOLECYSTECTOMY, LAPAROSCOPIC     TUBAL LIGATION      FAMILY HISTORY: Family History  Problem Relation Age of Onset   Hypertension Mother    Lupus Mother    Hypertension Father    Diabetes Father     Heart disease Father    Prostate cancer Father    Asthma Brother    Asthma Sister    Cancer Sister        breast cancer    Breast cancer Sister 67   Diabetes Sister    Diabetes Brother    Hypertension Sister    Hypertension Brother     SOCIAL HISTORY: Social History   Socioeconomic History   Marital status: Widowed    Spouse name: Not on file   Number of children: 2   Years of education: GED   Highest education level: Not on file  Occupational History   Occupation: Location manager  Tobacco Use   Smoking status: Never   Smokeless tobacco: Never  Substance and Sexual Activity   Alcohol use: No   Drug use: No   Sexual activity: Not on file  Other Topics Concern   Not on file  Social History Narrative   Lives in [place] with her sister. She works at [employer] living.    Drinks about 2 caffeine drinks a week    Social Determinants of Corporate investment banker Strain: Not on file  Food Insecurity: Not on file  Transportation Needs: Not on file  Physical Activity: Not on file  Stress: Not on file  Social Connections: Not on file  Intimate Partner Violence: Not on file      PHYSICAL EXAM  Vitals:   10/13/22 1056  BP: 128/68  Pulse: (!) 57  Weight: 226 lb (102.5 kg)  Height: 5\' 2"  (1.575 m)     Body mass index is 41.34 kg/m.  Generalized: Well developed, in no acute distress  Cardiology: normal rate and rhythm, systolic murmur auscultated  Respiratory: clear to auscultation bilaterally  Respiratory: Clear to auscultation bilaterally Neurological examination  Mentation: Alert oriented to time, place, history taking. Follows all commands speech and language fluent Cranial nerve II-XII: Pupils were equal round reactive to light. Extraocular movements were full, visual field were full on confrontational test. Facial sensation  and strength were normal. Head turning and shoulder shrug  were normal and symmetric. Motor: The motor testing  reveals 5 over 5 strength of all 4 extremities. Good symmetric motor tone is noted throughout.  Gait and station: Gait is normal.  DIAGNOSTIC DATA (LABS, IMAGING, TESTING) - I reviewed patient records, labs, notes, testing and imaging myself where available.      No data to display           Lab Results  Component Value Date   WBC 6.1 08/05/2022   HGB 13.1 08/05/2022   HCT 39.1 08/05/2022   MCV 90.1 08/05/2022   PLT 253.0 08/05/2022      Component Value Date/Time   NA 142 08/05/2022 0930   NA 141 06/11/2021 1117   K 3.6 08/05/2022 0930   CL 104 08/05/2022 0930   CO2 30 08/05/2022 0930   GLUCOSE 84 08/05/2022 0930   BUN 15 08/05/2022 0930   BUN 11 06/11/2021 1117   CREATININE 1.00 08/05/2022 0930   CREATININE 0.85 05/10/2016 1101   CALCIUM 9.8 08/05/2022 0930   PROT 7.1 08/05/2022 0930   PROT 6.6 12/22/2017 0934   ALBUMIN 4.5 08/05/2022 0930   ALBUMIN 4.5 12/22/2017 0934   AST 17 08/05/2022 0930   ALT 13 08/05/2022 0930   ALKPHOS 46 08/05/2022 0930   BILITOT 0.7 08/05/2022 0930   BILITOT 0.5 12/22/2017 0934   GFRNONAA 65 06/01/2019 0736   GFRNONAA 81 05/10/2016 1101   GFRAA 74 06/01/2019 0736   GFRAA >89 05/10/2016 1101   Lab Results  Component Value Date   CHOL 116 08/05/2022   HDL 53.50 08/05/2022   LDLCALC 46 08/05/2022   TRIG 81.0 08/05/2022   CHOLHDL 2 08/05/2022   Lab Results  Component Value Date   HGBA1C 5.6 08/05/2022   Lab Results  Component Value Date   VITAMINB12 438 09/16/2022   Lab Results  Component Value Date   TSH 1.87 08/05/2022       ASSESSMENT AND PLAN 57 y.o. year old female  has a past medical history of Allergy, Diverticulitis, GERD (gastroesophageal reflux disease) (1993), Heart murmur, Hypertension (1993), Leaky heart valve, Sleep apnea, and Vitamin D deficiency (2011). here with     ICD-10-CM   1. Obstructive sleep apnea treated with continuous positive airway pressure (CPAP)  G47.33 Home sleep test    For home use  only DME continuous positive airway pressure (CPAP)      She continues to do fairly well with CPAP therapy.  Compliance report reveals excellent compliance.  She is encouraged to continue using CPAP nightly and for greater than 4 hours each night.  Supply orders have been placed for the next year. I will repeat HST. Pending results, we will discuss getting new machine versus options for oral appliance if O2 sustained. She will continue close follow up with PCP and cardiology. Well balanced diet and regular exercise encouraged. She will follow-up with me pending sleep study.  She verbalizes understanding and agreement with this plan.  Orders Placed This Encounter  Procedures   For home use only DME continuous positive airway pressure (CPAP)    Supplies    Order Specific Question:   Length of Need    Answer:   Lifetime    Order Specific Question:   Patient has OSA or probable OSA    Answer:   Yes    Order Specific Question:   Is the patient currently using CPAP in the home    Answer:  Yes    Order Specific Question:   Settings    Answer:   Other see comments    Order Specific Question:   CPAP supplies needed    Answer:   Mask, headgear, cushions, filters, heated tubing and water chamber   Home sleep test    Standing Status:   Future    Standing Expiration Date:   10/13/2023    Order Specific Question:   Where should this test be performed:    Answer:   [facility]     No orders of the defined types were placed in this encounter.      Shawnie Dapper, FNP-C 10/13/2022, 11:23 AM [facility] [address] [place], [place] [address] [phone]

## 2022-10-17 ENCOUNTER — Encounter: Payer: Self-pay | Admitting: Cardiovascular Disease

## 2022-10-17 NOTE — Progress Notes (Unsigned)
Cardiology Office Note:    Date:  10/18/2022   ID:  PHILANA YOUNIS, DOB 1966-03-28, MRN [id number]  PCP:  Olive Bass, FNP  Cardiologist:   New to [facility]   Referring MD: Olive Bass,*   Chief Complaint  Patient presents with   Hypertension        Chest Pain   Coronary Artery Disease            SHRISTI SCHEIB is a 57 y.o. female with a hx of hypertension.  We are asked to see her today for further evaluation of chest discomfort by Dr.  Aura Camps   Has had palpitations for the past 5 years.  Associated with near syncope Has occasional CP.    Wakes up with chest pain .   Early morning hours or middle of the night .  Not exertional . Does not get any regular exercise.   Just bought a treadmill .   Has not started using it yet   Is eating more salt than usual.   earting more fast foods.   Having to take care of her mom.  Her father is now in a memory care unit.  Looking for assisted living care.     Sept. 26, , 2019 Shantana presented to me several months ago with some atypical chest pain.  We performed a coronary CT angiogram.  She was found to have mild coronary artery disease involving the left anterior descending artery.  Her coronary calcium score was 1.  Feb. 12, 2021:  Dara is seen today for a follow-up visit.  She is had some chest pain in the past.  Coronary CT angiogram reveals mild coronary artery disease involving the left anterior descending artery.  Coronary calcium score was 1. BP is a little elevated.    Still eating salt .   Is not exercising any longer   June 11, 2021:  Emmilia is seen today for follow-up visit.  She has a history of hypertension and hyperlipidemia.  Lipid levels from April, 2022 were reviewed.  Her total cholesterol is 139. HDL is 47. LDL is 67. Triglyceride level is 126.  Has sudden episodes of generalized weakness, dyspnea , milgl chest pain  Has to lie down  Might last several min Feels  better after a nap   Has OSA , uses her CPAP regularly   Previous echo in [place] 2016:   normal LV function , EF 55-60%. Grade 1 DD   Works 12 hours days,  really wears her out .     October 19, 2021 Devonna is seen for follow up of her HTN, hyperlipidemia  Has lost weight  Wt is 230 lbs.   Rare episodes of chest tightness   Coronary CT angiogram performed in July, 2019 revealed a coronary calcium score of 1 which places her in the 21 percentile for age and gender matched controls. CT angiograms of her coronary arteries revealed mild coronary disease.  I stressed the importance of lipid control.  Her last LDL was performed in March, 2023 and her LDL level was 73.  Triglyceride levels 132.  Has mild MR on exam and on echo     July 22,2024 Blaise is seen for follow up of her HTN, HLD Wt is 228 lbs ,   Some mild CP and dyspnea  Coronary CTA in March, 2023 reveals mild/minimal nonobstructive coronary artery disease.  Labs from May night, 2024 reveal a total cholesterol of 116 HDL is 53.5 LDL  is 46 Triglyceride level is 81.  Having back pain when she lies down.  Not while sitting or standing.  She will ask her primary MD about her back pain    Past Medical History:  Diagnosis Date   Allergy    Diverticulitis    GERD (gastroesophageal reflux disease) 1993   Heart murmur    Hypertension 1993   Leaky heart valve    Sleep apnea    Vitamin D deficiency 2011    Past Surgical History:  Procedure Laterality Date   ABDOMINAL HYSTERECTOMY     CHOLECYSTECTOMY     CHOLECYSTECTOMY, LAPAROSCOPIC     TUBAL LIGATION      Current Medications: No outpatient medications have been marked as taking for the 10/18/22 encounter (Office Visit) with Charnee Turnipseed, Deloris Ping, MD.     Allergies:   Patient has no known allergies.   Social History   Socioeconomic History   Marital status: Widowed    Spouse name: Not on file   Number of children: 2   Years of education: GED   Highest  education level: Not on file  Occupational History   Occupation: Location manager  Tobacco Use   Smoking status: Never   Smokeless tobacco: Never  Substance and Sexual Activity   Alcohol use: No   Drug use: No   Sexual activity: Not on file  Other Topics Concern   Not on file  Social History Narrative   Lives in [place] with her sister. She works at [employer] living.    Drinks about 2 caffeine drinks a week    Social Determinants of Corporate investment banker Strain: Not on file  Food Insecurity: Not on file  Transportation Needs: Not on file  Physical Activity: Not on file  Stress: Not on file  Social Connections: Not on file     Family History: The patient's family history includes Asthma in her brother and sister; Breast cancer (age of onset: 79) in her sister; Cancer in her sister; Diabetes in her brother, father, and sister; Heart disease in her father; Hypertension in her brother, father, mother, and sister; Lupus in her mother; Prostate cancer in her father.  ROS:   Please see the history of present illness.     All other systems reviewed and are negative.  EKGs/Labs/Other Studies Reviewed:    The following studies were reviewed today:    Recent Labs: 08/05/2022: ALT 13; BUN 15; Creatinine, Ser 1.00; Hemoglobin 13.1; Magnesium 1.9; Platelets 253.0; Potassium 3.6; Sodium 142; TSH 1.87  Recent Lipid Panel    Component Value Date/Time   CHOL 116 08/05/2022 0930   CHOL 144 06/11/2021 1117   TRIG 81.0 08/05/2022 0930   HDL 53.50 08/05/2022 0930   HDL 48 06/11/2021 1117   CHOLHDL 2 08/05/2022 0930   VLDL 16.2 08/05/2022 0930   LDLCALC 46 08/05/2022 0930   LDLCALC 73 06/11/2021 1117   Physical Exam: Blood pressure 138/78, pulse 68, height 5\' 2"  (1.575 m), weight 228 lb 3.2 oz (103.5 kg), SpO2 98%.       GEN:  middle age, moderately obese female, in no acute distress HEENT: Normal NECK: No JVD; No carotid bruits LYMPHATICS: No  lymphadenopathy CARDIAC: RRR  ,  very soft systolic murmur  RESPIRATORY:  Clear to auscultation without rales, wheezing or rhonchi  ABDOMEN: Soft, non-tender, non-distended MUSCULOSKELETAL:  No edema; No deformity  SKIN: Warm and dry NEUROLOGIC:  Alert and oriented x 3    EKG:  EKG Interpretation Date/Time:  Monday October 18 2022 10:55:20 EDT Ventricular Rate:  68 PR Interval:  140 QRS Duration:  78 QT Interval:  398 QTC Calculation: 423 R Axis:   48  Text Interpretation: Normal sinus rhythm Normal ECG When compared with ECG of 03-Jan-2014 09:20, No significant change was found Confirmed by Kristeen Miss ([id number]) on 10/18/2022 11:33:09 AM    ASSESSMENT:    1. Other chest pain   2. Benign essential hypertension   3. Hyperlipidemia with target LDL less than 100      PLAN:     Chest pain:       She has rare episodes of chest pain.  She has nonobstructive coronary artery disease by 2 different coronary CT angiograms.  I do not think that her chest pain is due to cardiac etiology.   2.  Hypertension:      Blood pressure is well-controlled.  Continue current medications.  Continue weight loss efforts.    3.  Hyperlipidemia:   Her last LDL looked great.  Continue current medications.  Will see her in 1 year     Medication Adjustments/Labs and Tests Ordered: Current medicines are reviewed at length with the patient today.  Concerns regarding medicines are outlined above.  Orders Placed This Encounter  Procedures   EKG 12-Lead   No orders of the defined types were placed in this encounter.     Patient Instructions  Medication Instructions:  Your physician recommends that you continue on your current medications as directed. Please refer to the Current Medication list given to you today.  *If you need a refill on your cardiac medications before your next appointment, please call your pharmacy*  Lab Work: None ordered today.  Testing/Procedures: None ordered  today.  Follow-Up: At [facility], you and your health needs are our priority.  As part of our continuing mission to provide you with exceptional heart care, we have created designated Provider Care Teams.  These Care Teams include your primary Cardiologist (physician) and Advanced Practice Providers (APPs -  Physician Assistants and Nurse Practitioners) who all work together to provide you with the care you need, when you need it.  Your next appointment:   1 year(s)  The format for your next appointment:   In Person  Provider:   Kristeen Miss, MD {   Signed, Kristeen Miss, MD  10/18/2022 11:33 AM    [facility]

## 2022-10-18 ENCOUNTER — Encounter: Payer: Self-pay | Admitting: Cardiovascular Disease

## 2022-10-18 ENCOUNTER — Ambulatory Visit: Payer: BC Managed Care – PPO | Attending: Cardiovascular Disease | Admitting: Cardiovascular Disease

## 2022-10-18 VITALS — BP 138/78 | HR 68 | Ht 62.0 in | Wt 228.2 lb

## 2022-10-18 DIAGNOSIS — H524 Presbyopia: Secondary | ICD-10-CM | POA: Diagnosis not present

## 2022-10-18 DIAGNOSIS — R0789 Other chest pain: Secondary | ICD-10-CM

## 2022-10-18 DIAGNOSIS — I10 Essential (primary) hypertension: Secondary | ICD-10-CM | POA: Diagnosis not present

## 2022-10-18 DIAGNOSIS — H2513 Age-related nuclear cataract, bilateral: Secondary | ICD-10-CM | POA: Diagnosis not present

## 2022-10-18 DIAGNOSIS — E785 Hyperlipidemia, unspecified: Secondary | ICD-10-CM | POA: Diagnosis not present

## 2022-10-18 DIAGNOSIS — E119 Type 2 diabetes mellitus without complications: Secondary | ICD-10-CM | POA: Diagnosis not present

## 2022-10-18 LAB — HM DIABETES EYE EXAM

## 2022-10-18 NOTE — Patient Instructions (Addendum)
Medication Instructions:  Your physician recommends that you continue on your current medications as directed. Please refer to the Current Medication list given to you today.  *If you need a refill on your cardiac medications before your next appointment, please call your pharmacy*  Lab Work: None ordered today.  Testing/Procedures: None ordered today.  Follow-Up: At [facility], you and your health needs are our priority.  As part of our continuing mission to provide you with exceptional heart care, we have created designated Provider Care Teams.  These Care Teams include your primary Cardiologist (physician) and Advanced Practice Providers (APPs -  Physician Assistants and Nurse Practitioners) who all work together to provide you with the care you need, when you need it.  Your next appointment:   1 year(s)  The format for your next appointment:   In Person  Provider:   Philip Nahser, MD { 

## 2022-10-20 DIAGNOSIS — G4733 Obstructive sleep apnea (adult) (pediatric): Secondary | ICD-10-CM | POA: Diagnosis not present

## 2022-10-25 ENCOUNTER — Other Ambulatory Visit: Payer: Self-pay | Admitting: Family

## 2022-10-25 DIAGNOSIS — M5416 Radiculopathy, lumbar region: Secondary | ICD-10-CM

## 2022-10-28 ENCOUNTER — Other Ambulatory Visit: Payer: Self-pay | Admitting: Family

## 2022-10-28 ENCOUNTER — Ambulatory Visit: Payer: BC Managed Care – PPO | Admitting: Physical Medicine and Rehabilitation

## 2022-10-28 ENCOUNTER — Encounter: Payer: Self-pay | Admitting: Physical Medicine and Rehabilitation

## 2022-10-28 DIAGNOSIS — M5442 Lumbago with sciatica, left side: Secondary | ICD-10-CM

## 2022-10-28 DIAGNOSIS — M5416 Radiculopathy, lumbar region: Secondary | ICD-10-CM | POA: Diagnosis not present

## 2022-10-28 DIAGNOSIS — M7918 Myalgia, other site: Secondary | ICD-10-CM

## 2022-10-28 DIAGNOSIS — M79604 Pain in right leg: Secondary | ICD-10-CM | POA: Diagnosis not present

## 2022-10-28 DIAGNOSIS — M25552 Pain in left hip: Secondary | ICD-10-CM

## 2022-10-28 DIAGNOSIS — M79605 Pain in left leg: Secondary | ICD-10-CM

## 2022-10-28 DIAGNOSIS — G8929 Other chronic pain: Secondary | ICD-10-CM

## 2022-10-28 DIAGNOSIS — M5441 Lumbago with sciatica, right side: Secondary | ICD-10-CM

## 2022-10-28 MED ORDER — ROPINIROLE HCL 0.25 MG PO TABS: 0.2500 mg | ORAL_TABLET | Freq: Every day | ORAL | 0 refills | Status: DC

## 2022-10-28 NOTE — Progress Notes (Signed)
Amanda [name] - 57 y.o. female MRN [id number]  Date of birth: 05/03/1965  Office Visit Note: Visit Date: 10/28/2022 PCP: Olive Bass, FNP Referred by: Olive Bass,*  Subjective: Chief Complaint  Patient presents with   Lower Back - Pain   HPI: Amanda [name] is a 57 y.o. female who comes in today per the request of Ria Clock, FNP for evaluation of chronic, worsening and severe pain to bilateral legs. Also reports intermittent tenderness to bilateral lower back, especially when working. History of lumbar strain in the past. Pain ongoing for several months, worsens with laying flat to sleep. Standing completely resolves her pain. She describes discomfort as aching sensation, denies pain at this time. Feels she constantly needs to move her legs at night to get comfortable. Some relief of pain with home exercise regimen, rest and use of medications. She recently attended 2 sessions of formal physical therapy with some relief of pain, states she is unable to return due to financial reasons. Reports PCP recently started her on Gabapentin at bedtime, she reports significant relief of pain with this medication. Recent lumbar x-ray imaging exhibits multilevel degenerative changes of the lumbar spine most pronounced at L2-L3 and L5-S1. She is scheduled for lumbar MRI imaging on 11/07/2022. 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 Patient denies focal weakness, numbness and tingling. No recent trauma or falls.   Of note, history of connective tissue disease, morbid obesity, and pre-diabetes.    Oswestry Disability Index Score 56% 20 to 30 (60%) severe disability: Pain remains the main problem in this group but activities of daily living are affected. These patients require a detailed investigation.  Review of Systems  Musculoskeletal:  Positive for back pain.  Neurological:  Negative for tingling, sensory change, focal weakness and weakness.  All  other systems reviewed and are negative.  Otherwise per HPI.  Assessment & Plan: Visit Diagnoses:    ICD-10-CM   1. Bilateral leg pain  M79.604    M79.605     2. Chronic bilateral low back pain with bilateral sciatica  M54.42    M54.41    G89.29     3. Lumbar radiculopathy  M54.16     4. Myofascial pain syndrome  M79.18        Plan: Findings:  Chronic, worsening and severe pain to bilateral legs. Intermittent tightness to bilateral lower back. Bilateral leg pain seems to be most severe issue at this time. She continues to have severe pain despite good conservative therapies such as formal physical therapy, home exercise regimen, rest and use of medications. Patients clinical presentation and exam are complex, differentials include lumbar radiculopathy and myofascial pain syndrome. Could also be intrinsic leg issue such as neurological or vascular issue. She is scheduled for lumbar MRI imaging on 11/07/2022. I will have her follow up post MRI imaging to discuss treatment plan. Depending on lumbar MRI results would consider performing epidural steroid injection. I discussed medication management and prescribed Requip for her to try at bedtime. I do feel she would benefit from re-grouping with PT, however current financial issues are a concern. Patient instructed to continue current medication regimen. No red flag symptoms noted upon exam today.     Meds & Orders:  Meds ordered this encounter  Medications   rOPINIRole (REQUIP) 0.25 MG tablet    Sig: Take 1 tablet (0.25 mg total) by mouth at bedtime.    Dispense:  30 tablet    Refill:  0   No orders of the defined types were placed in this encounter.   Follow-up: Return for follow up for lumbar MRI review.   Procedures: No procedures performed      Clinical History: CLINICAL DATA:  low back pain   EXAM: LUMBAR SPINE - 2-3 VIEW   COMPARISON:  September 09, 2020.   FINDINGS: There are five non-rib bearing lumbar-type vertebral  bodies. Mild retrolisthesis with a posterior disc osteophyte complex L2-3. There is no acute compression fracture deformity. Moderate intervertebral disc space height loss at L5-S1 and L2-3 with a posterior disc osteophyte complex. Moderate intervertebral disc space height loss at L1-2 and T12-L1. Multilevel endplate proliferative changes. Facet arthropathy. Visualized abdomen is unremarkable.   IMPRESSION: Multilevel degenerative changes of the lumbar spine most pronounced at L2-3 and L5-S1.     Electronically Signed   By: Meda Klinefelter M.D.   On: 10/22/2022 16:25   She reports that she has never smoked. She has never used smokeless tobacco.  Recent Labs    12/01/21 1033 08/05/22 0930  HGBA1C 6.0 5.6    Objective:  VS:  HT:    WT:   BMI:     BP:   HR: bpm  TEMP: ( )  RESP:  Physical Exam Vitals and nursing note reviewed.  HENT:     Head: Normocephalic and atraumatic.     Right Ear: External ear normal.     Left Ear: External ear normal.     Nose: Nose normal.     Mouth/Throat:     Mouth: Mucous membranes are moist.  Eyes:     Extraocular Movements: Extraocular movements intact.  Cardiovascular:     Rate and Rhythm: Normal rate.     Pulses: Normal pulses.  Pulmonary:     Effort: Pulmonary effort is normal.  Abdominal:     General: Abdomen is flat. There is no distension.  Musculoskeletal:        General: Tenderness present.     Cervical back: Normal range of motion.     Comments: Patient rises from seated position to standing without difficulty. Good lumbar range of motion. No pain noted with facet loading. 5/5 strength noted with bilateral hip flexion, knee flexion/extension, ankle dorsiflexion/plantarflexion and EHL. No clonus noted bilaterally. No pain upon palpation of greater trochanters. No pain with internal/external rotation of bilateral hips. Sensation intact bilaterally. Tenderness noted to bilateral lumbar paraspinal regions. Negative slump test  bilaterally. Ambulates without aid, gait steady.     Skin:    General: Skin is warm and dry.     Capillary Refill: Capillary refill takes less than 2 seconds.  Neurological:     General: No focal deficit present.     Mental Status: She is alert and oriented to person, place, and time.  Psychiatric:        Mood and Affect: Mood normal.        Behavior: Behavior normal.     Ortho Exam  Imaging: No results found.  Past Medical/Family/Surgical/Social History: Medications & Allergies reviewed per EMR, new medications updated. Patient Active Problem List   Diagnosis Date Noted   Obstructive sleep apnea treated with continuous positive airway pressure (CPAP) 06/01/2018   Pre-diabetes 06/01/2018   Hyperlipidemia with target LDL less than 100 01/06/2018   Stress incontinence of urine 06/26/2017   Encounter for general adult medical examination with abnormal findings 05/20/2017   Non-seasonal allergic rhinitis 04/14/2017   Moderate obstructive sleep apnea 05/20/2016   Recurrent sinus  infections 06/12/2014   Chest pain 01/03/2014   GERD (gastroesophageal reflux disease) 03/08/2012   Benign essential hypertension 03/08/2012   Past Medical History:  Diagnosis Date   Allergy    Diverticulitis    GERD (gastroesophageal reflux disease) 1993   Heart murmur    Hypertension 1993   Leaky heart valve    Sleep apnea    Vitamin D deficiency 2011   Family History  Problem Relation Age of Onset   Hypertension Mother    Lupus Mother    Hypertension Father    Diabetes Father    Heart disease Father    Prostate cancer Father    Asthma Brother    Asthma Sister    Cancer Sister        breast cancer    Breast cancer Sister 49   Diabetes Sister    Diabetes Brother    Hypertension Sister    Hypertension Brother    Past Surgical History:  Procedure Laterality Date   ABDOMINAL HYSTERECTOMY     CHOLECYSTECTOMY     CHOLECYSTECTOMY, LAPAROSCOPIC     TUBAL LIGATION     Social History    Occupational History   Occupation: Location manager  Tobacco Use   Smoking status: Never   Smokeless tobacco: Never  Substance and Sexual Activity   Alcohol use: No   Drug use: No   Sexual activity: Not on file

## 2022-10-28 NOTE — Progress Notes (Signed)
Functional Pain Scale - descriptive words and definitions  Distracting (5)    Aware of pain/able to complete some ADL's but limited by pain/sleep is affected and active distractions are only slightly useful. Moderate range order  Average Pain 9-10 when lying down  Lower back pain in the middle of back that radiates to the legs. Pain worse when lying down

## 2022-11-01 ENCOUNTER — Encounter: Payer: Self-pay | Admitting: Family Medicine

## 2022-11-05 ENCOUNTER — Other Ambulatory Visit: Payer: Self-pay | Admitting: Cardiovascular Disease

## 2022-11-07 ENCOUNTER — Other Ambulatory Visit: Payer: BC Managed Care – PPO

## 2022-11-08 ENCOUNTER — Encounter: Payer: Self-pay | Admitting: Family

## 2022-11-09 ENCOUNTER — Other Ambulatory Visit: Payer: Self-pay | Admitting: Family

## 2022-11-09 MED ORDER — GABAPENTIN 100 MG PO CAPS: 200.0000 mg | ORAL_CAPSULE | Freq: Every day | ORAL | 0 refills | Status: DC

## 2022-11-10 ENCOUNTER — Other Ambulatory Visit: Payer: BC Managed Care – PPO

## 2022-11-10 ENCOUNTER — Ambulatory Visit
Admission: RE | Admit: 2022-11-10 | Discharge: 2022-11-10 | Disposition: A | Discharge status: Home / Self Care | Payer: BC Managed Care – PPO | Source: Ambulatory Visit | Attending: Family | Admitting: Family

## 2022-11-10 DIAGNOSIS — M5126 Other intervertebral disc displacement, lumbar region: Secondary | ICD-10-CM | POA: Diagnosis not present

## 2022-11-10 DIAGNOSIS — M47816 Spondylosis without myelopathy or radiculopathy, lumbar region: Secondary | ICD-10-CM | POA: Diagnosis not present

## 2022-11-10 DIAGNOSIS — M48061 Spinal stenosis, lumbar region without neurogenic claudication: Secondary | ICD-10-CM | POA: Diagnosis not present

## 2022-11-10 DIAGNOSIS — M5416 Radiculopathy, lumbar region: Secondary | ICD-10-CM

## 2022-11-16 ENCOUNTER — Ambulatory Visit: Payer: BC Managed Care – PPO | Admitting: Neurology

## 2022-11-16 DIAGNOSIS — G4733 Obstructive sleep apnea (adult) (pediatric): Secondary | ICD-10-CM | POA: Diagnosis not present

## 2022-11-17 NOTE — Progress Notes (Signed)
See procedure note.

## 2022-11-19 NOTE — Procedures (Signed)
   [facility]  HOME SLEEP TEST (Watch PAT) REPORT  STUDY DATE: 11/16/2022  DOB: [birth year]  MRN: [id number]  ORDERING CLINICIAN: Huston Foley, MD, PhD   REFERRING CLINICIAN: Shawnie Dapper, NP  CLINICAL INFORMATION/HISTORY: 57 year old female with an underlying medical history of hypertension, allergies, reflux disease, vitamin D deficiency, and obesity, who presents for reevaluation of her obstructive sleep apnea.  She has had significant weight loss.  She has been compliant with her CPAP of 12 cm with EPR of 3 with excellent apnea control and tolerance of treatment.  She is potentially interested in an oral appliance.  BMI: 41.8 kg/m  FINDINGS:   Sleep Summary:   Total Recording Time (hours, min): 7 hours, 16 min  Total Sleep Time (hours, min):  5 hours, 25 min  Percent REM (%):    18%   Respiratory Indices:   Calculated pAHI (per hour):  13.1/hour         REM pAHI:    28.7/hour       NREM pAHI: 9.7/hour  Central pAHI: 0/hour  Oxygen Saturation Statistics:    Oxygen Saturation (%) Mean: 96%   Minimum oxygen saturation (%):                 90%   O2 Saturation Range (%): 90-100%    O2 Saturation (minutes) <=88%: 0 min  Pulse Rate Statistics:   Pulse Mean (bpm):    63/min    Pulse Range (49-98/min)   IMPRESSION: OSA (obstructive sleep apnea), mild  RECOMMENDATION:  This home sleep test demonstrates overall mild obstructive sleep apnea with a total AHI of 13.1/hour and O2 nadir of 90%. Snoring was detected, ranging from mild to louder.  Treatment options include weight loss along with avoidance of the supine sleep position, a dental device through dentistry or orthodontics, or ongoing treatment with positive airway pressure in the form of CPAP or AutoPap.  If the patient prefers to maintain treatment with a positive airway pressure device, she may be eligible for a new machine, and a new CPAP with a set pressure of 12 cm can be prescribed.   Please  note that untreated obstructive sleep apnea may carry additional perioperative morbidity. Patients with significant obstructive sleep apnea should receive perioperative PAP therapy and the surgeons and particularly the anesthesiologist should be informed of the diagnosis and the severity of the sleep disordered breathing. The patient should be cautioned not to drive, work at heights, or operate dangerous or heavy equipment when tired or sleepy. Review and reiteration of good sleep hygiene measures should be pursued with any patient. Other causes of the patient's symptoms, including circadian rhythm disturbances, an underlying mood disorder, medication effect and/or an underlying medical problem cannot be ruled out based on this test. Clinical correlation is recommended.  The patient and her referring provider will be notified of the test results. The patient will be seen in follow up in sleep clinic at [facility], as necessary.  I certify that I have reviewed the raw data recording prior to the issuance of this report in accordance with the standards of the American Academy of Sleep Medicine (AASM).  INTERPRETING PHYSICIAN:   Huston Foley, MD, PhD Medical Director, [facility]) Diplomat, ABPN (Neurology and Sleep)   [facility] [address] [place], [place] [address] [phone]

## 2022-11-20 DIAGNOSIS — G4733 Obstructive sleep apnea (adult) (pediatric): Secondary | ICD-10-CM | POA: Diagnosis not present

## 2022-11-22 ENCOUNTER — Encounter: Payer: Self-pay | Admitting: Family

## 2022-11-22 ENCOUNTER — Encounter: Payer: Self-pay | Admitting: Family Medicine

## 2022-11-22 DIAGNOSIS — G4733 Obstructive sleep apnea (adult) (pediatric): Secondary | ICD-10-CM

## 2022-11-23 ENCOUNTER — Telehealth: Payer: Self-pay

## 2022-11-23 NOTE — Telephone Encounter (Signed)
Call to patient, no answer. Left message to call regarding CPAP supplies and new machine patient believes was ordered.

## 2022-11-24 ENCOUNTER — Telehealth: Payer: Self-pay

## 2022-11-24 NOTE — Telephone Encounter (Signed)
LVM to return call to schedule MRI review

## 2022-11-24 NOTE — Addendum Note (Signed)
Addended by: Judi Cong on: 11/24/2022 12:27 PM   Modules accepted: Orders

## 2022-11-24 NOTE — Telephone Encounter (Signed)
Pt calling to let neurologist to order a new CPAP machine not supplies.

## 2022-11-24 NOTE — Telephone Encounter (Signed)
Will send the patient a mychart message to confirm if she would like to continue to get her new machine and supplies through current DME provider or another DME?

## 2022-11-24 NOTE — Telephone Encounter (Signed)
-----   Message from [place], [place] E sent at 11/23/2022  1:08 PM EDT ----- Can we get her scheduled for lumbar MRI review? Thanks ----- Message ----- From: Olive Bass, FNP Sent: 11/23/2022  12:06 PM EDT To: Juanda Chance, NP  Hi there-  I wanted to let you know that I finally got Amanda [name]'s MRI results back. I was not sure what type of follow up was planned with your office but I do think she needs further treatment. Please tell me what we need to do to help get her seen in follow up?  Thanks, Amanda Clock, FNP

## 2022-11-24 NOTE — Telephone Encounter (Signed)
Order has been sent to [facility]

## 2022-11-25 ENCOUNTER — Encounter: Payer: Self-pay | Admitting: Physical Medicine and Rehabilitation

## 2022-11-25 ENCOUNTER — Ambulatory Visit: Payer: BC Managed Care – PPO | Admitting: Physical Medicine and Rehabilitation

## 2022-11-25 DIAGNOSIS — M5416 Radiculopathy, lumbar region: Secondary | ICD-10-CM

## 2022-11-25 DIAGNOSIS — M5116 Intervertebral disc disorders with radiculopathy, lumbar region: Secondary | ICD-10-CM | POA: Diagnosis not present

## 2022-11-25 DIAGNOSIS — G8929 Other chronic pain: Secondary | ICD-10-CM | POA: Diagnosis not present

## 2022-11-25 NOTE — Progress Notes (Signed)
Amanda [name] - 57 y.o. female MRN [id number]  Date of birth: [birth year]  Office Visit Note: Visit Date: 11/25/2022 PCP: Olive Bass, FNP Referred by: Olive Bass,*  Subjective: Chief Complaint  Patient presents with   Lower Back - Follow-up   HPI: Amanda [name] is a 57 y.o. female who comes in today for evaluation of chronic pain to bilateral legs, tightness to bilateral lower back. States her pain is more intermittent since starting Requip. Pain ongoing for several months, worsens with prolonged sitting and laying down to sleep.  She describes discomfort as aching sensation, denies pain at this time. Some relief of pain with home exercise regimen, rest and use of medications. She recently attended 2 sessions of formal physical therapy with some relief of pain, states she is unable to return due to financial reasons. During our last office visit I started her on Requip and she reports some relief of pain, however she continues to experience severe pain to both legs intermittently. Recent lumbar MRI imaging exhibits small left subarticular disc extrusion at L5-S1 with mass effect on left S1 nerve root. Moderate bilateral facet arthropathy at L2-L3. No high grade spinal canal stenosis. Patient works at [employer] ([employer]), states she works 12 hour shifts and stands on her feet for long periods of time. Patient denies focal weakness, numbness and tingling. No recent trauma or falls.    Review of Systems  Musculoskeletal:  Positive for back pain.  Neurological:  Negative for tingling, sensory change, focal weakness and weakness.  All other systems reviewed and are negative.  Otherwise per HPI.  Assessment & Plan: Visit Diagnoses:    ICD-10-CM   1. Chronic bilateral low back pain with bilateral sciatica  M54.42 Ambulatory referral to Physical Medicine Rehab   M54.41    G89.29     2. Lumbar radiculopathy  M54.16 Ambulatory referral to Physical Medicine  Rehab    3. Intervertebral disc disorders with radiculopathy, lumbar region  M51.16 Ambulatory referral to Physical Medicine Rehab       Plan: Findings:  Chronic intermittent pain to bilateral legs, tightness to lower back. Patient continues to have severe pain despite good conservative therapies such as home exercise regimen, rest and use of medications. I discussed recent lumbar MRI today using images and spine model. Patients clinical presentation and exam are complex, her symptoms do not directly fit with recently lumbar MRI imaging, however there is disc extrusion at L5-S1 that could cause referred pain. Could also be more vascular or neurological related. Some relief of symptoms with Requip. I discussed treatment options with her in detail today. Next step is to perform diagnostic and hopefully therapeutic left L5-S1 interlaminar epidural steroid injection under fluoroscopic guidance. She is not currently taking anticoagulants. If good relief of pain we can repeat this injection infrequently as needed. Dr. Alvester Morin at bedside to discuss injection procedure, she has no questions at this time. No red flag symptoms noted upon exam today.     Meds & Orders: No orders of the defined types were placed in this encounter.   Orders Placed This Encounter  Procedures   Ambulatory referral to Physical Medicine Rehab    Follow-up: Return for Left L5-S1 interlaminar epidural steroid injection.   Procedures: No procedures performed      Clinical History: Narrative & Impression CLINICAL DATA:  Low back pain for 6-7 months, pain radiating to bilateral legs   EXAM: MRI LUMBAR SPINE WITHOUT CONTRAST   TECHNIQUE: Multiplanar, multisequence  MR imaging of the lumbar spine was performed. No intravenous contrast was administered.   COMPARISON:  None Available.   FINDINGS: Segmentation:  Standard.   Alignment:  2 mm retrolisthesis of T12 on L1.   Vertebrae: No acute fracture, evidence of discitis,  or aggressive bone lesion.   Conus medullaris and cauda equina: Conus extends to the L2 level. Conus and cauda equina appear normal.   Paraspinal and other soft tissues: No acute paraspinal abnormality.   Disc levels:   Disc spaces: Degenerative disease with disc height loss at T10-11, T11-12, T12-L1, L1-2, L2-3, L3-4 and L5-S1.   T11-12: Mild broad-based disc bulge. No foraminal or central canal stenosis.   T12-L1: Broad-based shallow right paracentral disc protrusion. No foraminal or central canal stenosis. Mild bilateral facet arthropathy.   L1-L2: Mild broad-based disc bulge. Mild bilateral facet arthropathy. No foraminal or central canal stenosis.   L2-L3: Broad-based disc bulge with a broad left paracentral disc protrusion. Moderate bilateral facet arthropathy. Mild spinal stenosis. Left subarticular recess stenosis. No foraminal stenosis.   L3-L4: Mild broad-based disc bulge. Mild bilateral facet arthropathy. No foraminal or central canal stenosis.   L4-L5: Mild broad-based disc bulge. Mild bilateral facet arthropathy. No foraminal or central canal stenosis.   L5-S1: Broad-based disc bulge with a small left subarticular disc extrusion and mass effect on the left intraspinal S1 nerve root. Mild-moderate bilateral foraminal stenosis. No spinal stenosis.   IMPRESSION: 1. At L5-S1 there is a broad-based disc bulge with a small left subarticular disc extrusion and mass effect on the left intraspinal S1 nerve root. Mild-moderate bilateral foraminal stenosis. 2. At L2-3 there is a broad-based disc bulge with a broad left paracentral disc protrusion. Moderate bilateral facet arthropathy. Mild spinal stenosis. Left subarticular recess stenosis. 3. Diffuse lumbar spine spondylosis as described above. 4. No acute osseous injury of the lumbar spine.     Electronically Signed   By: Elige Ko M.D.   On: 11/22/2022 08:21   She reports that she has never smoked. She has  never used smokeless tobacco.  Recent Labs    12/01/21 1033 08/05/22 0930  HGBA1C 6.0 5.6    Objective:  VS:  HT:    WT:   BMI:     BP:   HR: bpm  TEMP: ( )  RESP:  Physical Exam Vitals and nursing note reviewed.  HENT:     Head: Normocephalic and atraumatic.     Right Ear: External ear normal.     Left Ear: External ear normal.     Nose: Nose normal.     Mouth/Throat:     Mouth: Mucous membranes are moist.  Eyes:     Extraocular Movements: Extraocular movements intact.  Cardiovascular:     Rate and Rhythm: Normal rate.     Pulses: Normal pulses.  Pulmonary:     Effort: Pulmonary effort is normal.  Abdominal:     General: Abdomen is flat. There is no distension.  Musculoskeletal:        General: Tenderness present.     Cervical back: Normal range of motion.     Comments: Patient rises from seated position to standing without difficulty. Good lumbar range of motion. No pain noted with facet loading. 5/5 strength noted with bilateral hip flexion, knee flexion/extension, ankle dorsiflexion/plantarflexion and EHL. No clonus noted bilaterally. No pain upon palpation of greater trochanters. No pain with internal/external rotation of bilateral hips. Sensation intact bilaterally. Tenderness noted to bilateral lumbar paraspinal regions. Negative slump  test bilaterally. Ambulates without aid, gait steady.       Skin:    General: Skin is warm and dry.     Capillary Refill: Capillary refill takes less than 2 seconds.  Neurological:     General: No focal deficit present.     Mental Status: She is alert and oriented to person, place, and time.  Psychiatric:        Mood and Affect: Mood normal.        Behavior: Behavior normal.     Ortho Exam  Imaging: No results found.  Past Medical/Family/Surgical/Social History: Medications & Allergies reviewed per EMR, new medications updated. Patient Active Problem List   Diagnosis Date Noted   Obstructive sleep apnea treated with  continuous positive airway pressure (CPAP) 06/01/2018   Pre-diabetes 06/01/2018   Hyperlipidemia with target LDL less than 100 01/06/2018   Stress incontinence of urine 06/26/2017   Encounter for general adult medical examination with abnormal findings 05/20/2017   Non-seasonal allergic rhinitis 04/14/2017   Moderate obstructive sleep apnea 05/20/2016   Recurrent sinus infections 06/12/2014   Chest pain 01/03/2014   GERD (gastroesophageal reflux disease) 03/08/2012   Benign essential hypertension 03/08/2012   Past Medical History:  Diagnosis Date   Allergy    Diverticulitis    GERD (gastroesophageal reflux disease) 1993   Heart murmur    Hypertension 1993   Leaky heart valve    Sleep apnea    Vitamin D deficiency 2011   Family History  Problem Relation Age of Onset   Hypertension Mother    Lupus Mother    Hypertension Father    Diabetes Father    Heart disease Father    Prostate cancer Father    Asthma Brother    Asthma Sister    Cancer Sister        breast cancer    Breast cancer Sister 45   Diabetes Sister    Diabetes Brother    Hypertension Sister    Hypertension Brother    Past Surgical History:  Procedure Laterality Date   ABDOMINAL HYSTERECTOMY     CHOLECYSTECTOMY     CHOLECYSTECTOMY, LAPAROSCOPIC     TUBAL LIGATION     Social History   Occupational History   Occupation: Location manager  Tobacco Use   Smoking status: Never   Smokeless tobacco: Never  Substance and Sexual Activity   Alcohol use: No   Drug use: No   Sexual activity: Not on file

## 2022-12-14 ENCOUNTER — Telehealth: Payer: Self-pay

## 2022-12-14 ENCOUNTER — Other Ambulatory Visit: Payer: Self-pay | Admitting: Physical Medicine and Rehabilitation

## 2022-12-14 MED ORDER — DIAZEPAM 5 MG PO TABS
ORAL_TABLET | ORAL | 0 refills | Status: DC
Start: 2022-12-14 — End: 2023-12-02

## 2022-12-14 NOTE — Telephone Encounter (Signed)
Patient is scheduled for injection 12/22/22. Needs pre procedure Valium sent to [facility]

## 2022-12-21 DIAGNOSIS — G4733 Obstructive sleep apnea (adult) (pediatric): Secondary | ICD-10-CM | POA: Diagnosis not present

## 2022-12-22 ENCOUNTER — Other Ambulatory Visit: Payer: Self-pay

## 2022-12-22 ENCOUNTER — Ambulatory Visit: Payer: BC Managed Care – PPO | Admitting: Physical Medicine and Rehabilitation

## 2022-12-22 VITALS — BP 116/73 | HR 60

## 2022-12-22 DIAGNOSIS — M5416 Radiculopathy, lumbar region: Secondary | ICD-10-CM

## 2022-12-22 MED ORDER — METHYLPREDNISOLONE ACETATE 80 MG/ML IJ SUSP
80.0000 mg | Freq: Once | INTRAMUSCULAR | Status: AC
  Administered 2022-12-22: 80 mg

## 2022-12-22 NOTE — Patient Instructions (Signed)

## 2022-12-22 NOTE — Progress Notes (Signed)
Functional Pain Scale - descriptive words and definitions  Distressing (6)    Pain is present/unable to complete most ADLs limited by pain/sleep is difficult and active distraction is only marginal. Moderate range order  Average Pain 7-8 when lying down   +Driver, -BT, -Dye Allergies.  Lower back pain on left side that radiates into the left leg to the ankle. Pain worse when lying down

## 2022-12-24 NOTE — Procedures (Signed)
Lumbar Epidural Steroid Injection - Interlaminar Approach with Fluoroscopic Guidance  Patient: Amanda [name]      Date of Birth: [birth year] MRN: [id number] PCP: Olive Bass, FNP      Visit Date: 12/22/2022   Universal Protocol:     Consent Given By: the patient  Position: PRONE  Additional Comments: Vital signs were monitored before and after the procedure. Patient was prepped and draped in the usual sterile fashion. The correct patient, procedure, and site was verified.   Injection Procedure Details:   Procedure diagnoses: Lumbar radiculopathy [M54.16]   Meds Administered:  Meds ordered this encounter  Medications   methylPREDNISolone acetate (DEPO-MEDROL) injection 80 mg     Laterality: Left  Location/Site:  L5-S1  Needle: 4.5 in., 20 ga. Tuohy  Needle Placement: Paramedian epidural  Findings:   -Comments: Excellent flow of contrast into the epidural space.  Procedure Details: Using a paramedian approach from the side mentioned above, the region overlying the inferior lamina was localized under fluoroscopic visualization and the soft tissues overlying this structure were infiltrated with 4 ml. of 1% Lidocaine without Epinephrine. The Tuohy needle was inserted into the epidural space using a paramedian approach.   The epidural space was localized using loss of resistance along with counter oblique bi-planar fluoroscopic views.  After negative aspirate for air, blood, and CSF, a 2 ml. volume of Isovue-250 was injected into the epidural space and the flow of contrast was observed. Radiographs were obtained for documentation purposes.    The injectate was administered into the level noted above.   Additional Comments:  No complications occurred Dressing: 2 x 2 sterile gauze and Band-Aid    Post-procedure details: Patient was observed during the procedure. Post-procedure instructions were reviewed.  Patient left the clinic in stable condition.

## 2022-12-24 NOTE — Progress Notes (Signed)
Amanda [name] - 57 y.o. female MRN [id number]  Date of birth: 03/03/1966  Office Visit Note: Visit Date: 12/22/2022 PCP: Olive Bass, FNP Referred by: Olive Bass,*  Subjective: Chief Complaint  Patient presents with   Lower Back - Pain   HPI:  Amanda [name] is a 57 y.o. female who comes in today at the request of Ellin Goodie, FNP for planned Left L5-S1 Lumbar Interlaminar epidural steroid injection with fluoroscopic guidance.  The patient has failed conservative care including home exercise, medications, time and activity modification.  This injection will be diagnostic and hopefully therapeutic.  Please see requesting physician notes for further details and justification.   ROS Otherwise per HPI.  Assessment & Plan: Visit Diagnoses:    ICD-10-CM   1. Lumbar radiculopathy  M54.16 XR C-ARM NO REPORT    Epidural Steroid injection    methylPREDNISolone acetate (DEPO-MEDROL) injection 80 mg      Plan: No additional findings.   Meds & Orders:  Meds ordered this encounter  Medications   methylPREDNISolone acetate (DEPO-MEDROL) injection 80 mg    Orders Placed This Encounter  Procedures   XR C-ARM NO REPORT   Epidural Steroid injection    Follow-up: Return for visit to requesting provider as needed.   Procedures: No procedures performed  Lumbar Epidural Steroid Injection - Interlaminar Approach with Fluoroscopic Guidance  Patient: Amanda [name]      Date of Birth: 1965-04-12 MRN: [id number] PCP: Olive Bass, FNP      Visit Date: 12/22/2022   Universal Protocol:     Consent Given By: the patient  Position: PRONE  Additional Comments: Vital signs were monitored before and after the procedure. Patient was prepped and draped in the usual sterile fashion. The correct patient, procedure, and site was verified.   Injection Procedure Details:   Procedure diagnoses: Lumbar radiculopathy [M54.16]   Meds  Administered:  Meds ordered this encounter  Medications   methylPREDNISolone acetate (DEPO-MEDROL) injection 80 mg     Laterality: Left  Location/Site:  L5-S1  Needle: 4.5 in., 20 ga. Tuohy  Needle Placement: Paramedian epidural  Findings:   -Comments: Excellent flow of contrast into the epidural space.  Procedure Details: Using a paramedian approach from the side mentioned above, the region overlying the inferior lamina was localized under fluoroscopic visualization and the soft tissues overlying this structure were infiltrated with 4 ml. of 1% Lidocaine without Epinephrine. The Tuohy needle was inserted into the epidural space using a paramedian approach.   The epidural space was localized using loss of resistance along with counter oblique bi-planar fluoroscopic views.  After negative aspirate for air, blood, and CSF, a 2 ml. volume of Isovue-250 was injected into the epidural space and the flow of contrast was observed. Radiographs were obtained for documentation purposes.    The injectate was administered into the level noted above.   Additional Comments:  No complications occurred Dressing: 2 x 2 sterile gauze and Band-Aid    Post-procedure details: Patient was observed during the procedure. Post-procedure instructions were reviewed.  Patient left the clinic in stable condition.   Clinical History: Narrative & Impression CLINICAL DATA:  Low back pain for 6-7 months, pain radiating to bilateral legs   EXAM: MRI LUMBAR SPINE WITHOUT CONTRAST   TECHNIQUE: Multiplanar, multisequence MR imaging of the lumbar spine was performed. No intravenous contrast was administered.   COMPARISON:  None Available.   FINDINGS: Segmentation:  Standard.   Alignment:  2 mm retrolisthesis  of T12 on L1.   Vertebrae: No acute fracture, evidence of discitis, or aggressive bone lesion.   Conus medullaris and cauda equina: Conus extends to the L2 level. Conus and cauda equina  appear normal.   Paraspinal and other soft tissues: No acute paraspinal abnormality.   Disc levels:   Disc spaces: Degenerative disease with disc height loss at T10-11, T11-12, T12-L1, L1-2, L2-3, L3-4 and L5-S1.   T11-12: Mild broad-based disc bulge. No foraminal or central canal stenosis.   T12-L1: Broad-based shallow right paracentral disc protrusion. No foraminal or central canal stenosis. Mild bilateral facet arthropathy.   L1-L2: Mild broad-based disc bulge. Mild bilateral facet arthropathy. No foraminal or central canal stenosis.   L2-L3: Broad-based disc bulge with a broad left paracentral disc protrusion. Moderate bilateral facet arthropathy. Mild spinal stenosis. Left subarticular recess stenosis. No foraminal stenosis.   L3-L4: Mild broad-based disc bulge. Mild bilateral facet arthropathy. No foraminal or central canal stenosis.   L4-L5: Mild broad-based disc bulge. Mild bilateral facet arthropathy. No foraminal or central canal stenosis.   L5-S1: Broad-based disc bulge with a small left subarticular disc extrusion and mass effect on the left intraspinal S1 nerve root. Mild-moderate bilateral foraminal stenosis. No spinal stenosis.   IMPRESSION: 1. At L5-S1 there is a broad-based disc bulge with a small left subarticular disc extrusion and mass effect on the left intraspinal S1 nerve root. Mild-moderate bilateral foraminal stenosis. 2. At L2-3 there is a broad-based disc bulge with a broad left paracentral disc protrusion. Moderate bilateral facet arthropathy. Mild spinal stenosis. Left subarticular recess stenosis. 3. Diffuse lumbar spine spondylosis as described above. 4. No acute osseous injury of the lumbar spine.     Electronically Signed   By: Elige Ko M.D.   On: 11/22/2022 08:21     Objective:  VS:  HT:    WT:   BMI:     BP:116/73  HR:60bpm  TEMP: ( )  RESP:  Physical Exam Vitals and nursing note reviewed.  Constitutional:       General: She is not in acute distress.    Appearance: Normal appearance. She is not ill-appearing.  HENT:     Head: Normocephalic and atraumatic.     Right Ear: External ear normal.     Left Ear: External ear normal.  Eyes:     Extraocular Movements: Extraocular movements intact.  Cardiovascular:     Rate and Rhythm: Normal rate.     Pulses: Normal pulses.  Pulmonary:     Effort: Pulmonary effort is normal. No respiratory distress.  Abdominal:     General: There is no distension.     Palpations: Abdomen is soft.  Musculoskeletal:        General: Tenderness present.     Cervical back: Neck supple.     Right lower leg: No edema.     Left lower leg: No edema.     Comments: Patient has good distal strength with no pain over the greater trochanters.  No clonus or focal weakness.  Skin:    Findings: No erythema, lesion or rash.  Neurological:     General: No focal deficit present.     Mental Status: She is alert and oriented to person, place, and time.     Sensory: No sensory deficit.     Motor: No weakness or abnormal muscle tone.     Coordination: Coordination normal.  Psychiatric:        Mood and Affect: Mood normal.  Behavior: Behavior normal.      Imaging: No results found.

## 2022-12-27 DIAGNOSIS — G4733 Obstructive sleep apnea (adult) (pediatric): Secondary | ICD-10-CM | POA: Diagnosis not present

## 2023-01-06 ENCOUNTER — Ambulatory Visit: Payer: BC Managed Care – PPO | Admitting: Physical Medicine and Rehabilitation

## 2023-01-26 DIAGNOSIS — G4733 Obstructive sleep apnea (adult) (pediatric): Secondary | ICD-10-CM | POA: Diagnosis not present

## 2023-02-02 DIAGNOSIS — R519 Headache, unspecified: Secondary | ICD-10-CM | POA: Diagnosis not present

## 2023-02-02 DIAGNOSIS — K529 Noninfective gastroenteritis and colitis, unspecified: Secondary | ICD-10-CM | POA: Diagnosis not present

## 2023-02-02 DIAGNOSIS — R110 Nausea: Secondary | ICD-10-CM | POA: Diagnosis not present

## 2023-02-08 ENCOUNTER — Other Ambulatory Visit: Payer: Self-pay | Admitting: Family

## 2023-02-08 MED ORDER — ROSUVASTATIN CALCIUM 10 MG PO TABS: 10.0000 mg | ORAL_TABLET | Freq: Every day | ORAL | 3 refills | Status: DC

## 2023-02-08 MED ORDER — PANTOPRAZOLE SODIUM 40 MG PO TBEC: 40.0000 mg | DELAYED_RELEASE_TABLET | Freq: Two times a day (BID) | ORAL | 3 refills | Status: DC

## 2023-02-14 NOTE — Patient Instructions (Signed)

## 2023-02-14 NOTE — Progress Notes (Unsigned)
PATIENT: Amanda [name] DOB: 1965-06-22  REASON FOR VISIT: follow up HISTORY FROM: patient  No chief complaint on file.    HISTORY OF PRESENT ILLNESS:  02/14/23 ALL:  Amanda [name] returns for follow up for OSA on CPAP. She was last seen 09/2022 and eligible for a new machine. HST 10/2022 showed mild obstructive sleep apnea with a total AHI of 13.1/hour and O2 nadir of 90%. New autoPAP was ordered.     10/13/2022 ALL:  Amanda [name] returns for follow up for OSA on CPAP. She is doing fairly well. She does not love using CPAP but recognizes medical benefit. She has been curious about trying an oral appliance. She has lost about 30lbs since last visit. She was down to 65lb weight loss but had trouble with hip pain and unable to continue exercise. She has recently gotten hip pain under control and wishes to resume exercise. HST 2018 showed AHI 13.8/h and O2 nadir 72%. ONO showed resolution of hypoxia on CPAP.     06/15/2021 ALL: Amanda [name] returns for follow up for OSA on CPAP. She continues to do well on therapy. She is using her machine every night. She has been a little more tired and sleepy during the day. She was recently evaluated by cardiology for concerns of chest pain and shob. Dr Melburn Popper noted a murmur and has scheduled ECHO and CT. She was started on metoprolol 25mg  BID and Imdur 30mg  daily. She has follow up with PCP next week.     06/12/2020 ALL:  She returns for follow up for OSA on CPAP. She is doing very well. She was diagnosed with Covid in 03/2020 and treated for PNA 05/26/2020. She had a period of about 3-4 days where she could not use her CPAP due to coughing and felt that she did not sleep well at all. She does report feeling significantly better on CPAP therapy. No concerns with machine or supplies.       06/13/2019 ALL:  Amanda [name] is a 57 y.o. female here today for follow up for OSA on CPAP therapy. Overall, she is doing well. She does endorse more fatigue and  headaches recently. She wakes up at 3 days a week feeling exhausted. She reports having a mild headache about once weekly, frontal and throbbing. No migraine symptoms or vision changes. Headache resolves with Tylenol. BP has been slightly elevated. She was seen by cardiology and started on chlorthalidone 12.5mg  daily and potassium supplements. She has also noted significant hair loss that started about 2 years ago. She had labs with PCP that were normal. She was told it was related to stress. She had to place parents in nursing homes in the recent years. Her father died in in 10-06-17.  She is working 12 hour shifts at work. She tries to drink 3 bottles of water daily.   Compliance report dated 05/13/2019 through 06/11/2019 reveals that she has used CPAP 30 of the last 30 days for compliance of 100%.  She used CPAP greater than 4 hours all 30 days for compliance 100%.  Average usage was 7 hours and 45 minutes.  Residual AHI was 1.5 on 12 cm of water and an EPR of 3.  There were no significant leaks noted.  HISTORY: (copied from [facility] Martin's note on 06/01/2018)  Amanda [name] is a 57 year old right-handed woman with an underlying medical history of hypertension, allergies, reflux disease, vitamin D deficiency, and morbid obesity, who presents for follow-up consultation of her sleep disorder,  after home sleep study testing and trial of AutoPap therapy. The patient is unaccompanied today. I last saw her on 08/10/2016, at which time she reported doing much better after starting AutoPap therapy. She felt that her sleep quality and sleep consolidation as well as daytime energy were improved as well as her morning headaches. She was motivated to work on her weight loss and continue with AutoPap. I suggested we proceed with an overnight pulse oximetry test to monitor oxygen saturations for 1 night. She had a pulse ox on 08/24/2016 with a total duration of 5 hours and 56 minutes, average oxygen saturation of 97.3%,  nadir of 92%.   Today, 02/10/2017: I reviewed her AutoPap compliance data from 01/10/2017 through 02/08/2017 which is a total of 30 days, during which time she used her machine every night with percent used days greater than 4 hours at 90%, indicating excellent compliance with an average usage of 7 hours and 34 minutes, residual AHI at goal at 1.6 per hour, pressure for the 95th percentile right at 12 cm, leak very low, pressure setting of 4-13 cm with EPR. She reports doing well, will start going to the gym. Had a bout of bronchitis, went to UC and had a Zpack. Could not use the machine then. Otherwise doing well. Would be okay trying CPAP of 12 cm, using nasal mask.   UPDATE 3/5/2020CM Amanda [name], 57 year old female returns for follow-up with history of obstructive sleep apnea here for CPAP compliance.  She is doing well with her CPAP.  She claims she has more sleepiness lately.  She was recently switched from an 8-hour shift to a 12-hour shift and she is still adjusting to this.  CPAP data dated 05/01/2018-05/30/2018 shows compliance greater than 4 hours at 83%.  Average usage 7 hours 19 minutes.  Set pressure 12 cm leak 95th percentile 0.9.  AHI 0.9.  The other 5 days she did not use the machine she had a cold.  She returns for reevaluation   REVIEW OF SYSTEMS: Out of a complete 14 system review of symptoms, the patient complains only of the following symptoms, chest pain, shob, dizziness, and all other reviewed systems are negative.   ESS: 9/24, previously 13/24   ALLERGIES: No Known Allergies  HOME MEDICATIONS: Outpatient Medications Prior to Visit  Medication Sig Dispense Refill   aspirin EC 81 MG tablet Take 81 mg by mouth daily. Swallow whole.     blood glucose meter kit and supplies KIT Dispense based on patient and insurance preference. Use up to four times daily as directed. (FOR ICD-9 250.00, 250.01). 1 each 0   cetirizine (ZYRTEC) 10 MG tablet Take 1 tablet (10 mg total) by mouth  daily. 90 tablet 3   cholecalciferol (VITAMIN D3) 25 MCG (1000 UNIT) tablet Take 1,000 Units by mouth daily.     diazepam (VALIUM) 5 MG tablet Take one tablet by mouth with food one hour prior to procedure. May repeat 30 minutes prior if needed. 2 tablet 0   gabapentin (NEURONTIN) 100 MG capsule Take 2 capsules (200 mg total) by mouth at bedtime. 90 capsule 0   isosorbide mononitrate (IMDUR) 30 MG 24 hr tablet TAKE 1 TABLET DAILY 90 tablet 3   losartan (COZAAR) 100 MG tablet Take 1 tablet (100 mg total) by mouth daily. 90 tablet 3   meloxicam (MOBIC) 15 MG tablet TAKE 1 TABLET DAILY 90 tablet 3   metFORMIN (GLUCOPHAGE) 500 MG tablet TAKE 1 TABLET DAILY AFTER SUPPER 90 tablet  3   Methylcobalamin (B12-ACTIVE PO) Take by mouth. 1 tablet per day [facility]     metoprolol tartrate (LOPRESSOR) 25 MG tablet TAKE 1 TABLET TWICE A DAY 180 tablet 3   Multiple Vitamins-Minerals (PRESERVISION AREDS 2) CAPS Take by mouth.     nitroGLYCERIN (NITROSTAT) 0.4 MG SL tablet Place 1 tablet (0.4 mg total) under the tongue every 5 (five) minutes as needed for chest pain. 25 tablet 3   ONETOUCH VERIO test strip USE UP TO FOUR TIMES A DAY AS DIRECTED 100 strip 13   pantoprazole (PROTONIX) 40 MG tablet Take 1 tablet (40 mg total) by mouth 2 (two) times daily. 180 tablet 3   rOPINIRole (REQUIP) 0.25 MG tablet Take 1 tablet (0.25 mg total) by mouth at bedtime. 30 tablet 0   rosuvastatin (CRESTOR) 10 MG tablet Take 1 tablet (10 mg total) by mouth daily. 90 tablet 3   venlafaxine XR (EFFEXOR XR) 37.5 MG 24 hr capsule Take 1 capsule (37.5 mg total) by mouth daily with breakfast. 30 capsule 0   No facility-administered medications prior to visit.    PAST MEDICAL HISTORY: Past Medical History:  Diagnosis Date   Allergy    Diverticulitis    GERD (gastroesophageal reflux disease) 1993   Heart murmur    Hypertension 1993   Leaky heart valve    Sleep apnea    Vitamin D deficiency 2011    PAST SURGICAL  HISTORY: Past Surgical History:  Procedure Laterality Date   ABDOMINAL HYSTERECTOMY     CHOLECYSTECTOMY     CHOLECYSTECTOMY, LAPAROSCOPIC     TUBAL LIGATION      FAMILY HISTORY: Family History  Problem Relation Age of Onset   Hypertension Mother    Lupus Mother    Hypertension Father    Diabetes Father    Heart disease Father    Prostate cancer Father    Asthma Brother    Asthma Sister    Cancer Sister        breast cancer    Breast cancer Sister 53   Diabetes Sister    Diabetes Brother    Hypertension Sister    Hypertension Brother     SOCIAL HISTORY: Social History   Socioeconomic History   Marital status: Widowed    Spouse name: Not on file   Number of children: 2   Years of education: GED   Highest education level: Not on file  Occupational History   Occupation: Location manager  Tobacco Use   Smoking status: Never   Smokeless tobacco: Never  Substance and Sexual Activity   Alcohol use: No   Drug use: No   Sexual activity: Not on file  Other Topics Concern   Not on file  Social History Narrative   Lives in [place] with her sister. She works at [employer] living.    Drinks about 2 caffeine drinks a week    Social Determinants of Corporate investment banker Strain: Not on file  Food Insecurity: Not on file  Transportation Needs: Not on file  Physical Activity: Not on file  Stress: Not on file  Social Connections: Not on file  Intimate Partner Violence: Not on file      PHYSICAL EXAM  There were no vitals filed for this visit.    There is no height or weight on file to calculate BMI.  Generalized: Well developed, in no acute distress  Cardiology: normal rate and rhythm, systolic murmur auscultated  Respiratory: clear to  auscultation bilaterally  Respiratory: Clear to auscultation bilaterally Neurological examination  Mentation: Alert oriented to time, place, history taking. Follows all commands speech and language  fluent Cranial nerve II-XII: Pupils were equal round reactive to light. Extraocular movements were full, visual field were full on confrontational test. Facial sensation and strength were normal. Head turning and shoulder shrug  were normal and symmetric. Motor: The motor testing reveals 5 over 5 strength of all 4 extremities. Good symmetric motor tone is noted throughout.  Gait and station: Gait is normal.  DIAGNOSTIC DATA (LABS, IMAGING, TESTING) - I reviewed patient records, labs, notes, testing and imaging myself where available.      No data to display           Lab Results  Component Value Date   WBC 6.1 08/05/2022   HGB 13.1 08/05/2022   HCT 39.1 08/05/2022   MCV 90.1 08/05/2022   PLT 253.0 08/05/2022      Component Value Date/Time   NA 142 08/05/2022 0930   NA 141 06/11/2021 1117   K 3.6 08/05/2022 0930   CL 104 08/05/2022 0930   CO2 30 08/05/2022 0930   GLUCOSE 84 08/05/2022 0930   BUN 15 08/05/2022 0930   BUN 11 06/11/2021 1117   CREATININE 1.00 08/05/2022 0930   CREATININE 0.85 05/10/2016 1101   CALCIUM 9.8 08/05/2022 0930   PROT 7.1 08/05/2022 0930   PROT 6.6 12/22/2017 0934   ALBUMIN 4.5 08/05/2022 0930   ALBUMIN 4.5 12/22/2017 0934   AST 17 08/05/2022 0930   ALT 13 08/05/2022 0930   ALKPHOS 46 08/05/2022 0930   BILITOT 0.7 08/05/2022 0930   BILITOT 0.5 12/22/2017 0934   GFRNONAA 65 06/01/2019 0736   GFRNONAA 81 05/10/2016 1101   GFRAA 74 06/01/2019 0736   GFRAA >89 05/10/2016 1101   Lab Results  Component Value Date   CHOL 116 08/05/2022   HDL 53.50 08/05/2022   LDLCALC 46 08/05/2022   TRIG 81.0 08/05/2022   CHOLHDL 2 08/05/2022   Lab Results  Component Value Date   HGBA1C 5.6 08/05/2022   Lab Results  Component Value Date   VITAMINB12 438 09/16/2022   Lab Results  Component Value Date   TSH 1.87 08/05/2022       ASSESSMENT AND PLAN 57 y.o. year old female  has a past medical history of Allergy, Diverticulitis, GERD  (gastroesophageal reflux disease) (1993), Heart murmur, Hypertension (1993), Leaky heart valve, Sleep apnea, and Vitamin D deficiency (2011). here with   No diagnosis found.   She continues to do fairly well with CPAP therapy.  Compliance report reveals excellent compliance.  She is encouraged to continue using CPAP nightly and for greater than 4 hours each night.  Supply orders have been placed for the next year. I will repeat HST. Pending results, we will discuss getting new machine versus options for oral appliance if O2 sustained. She will continue close follow up with PCP and cardiology. Well balanced diet and regular exercise encouraged. She will follow-up with me pending sleep study.  She verbalizes understanding and agreement with this plan.  No orders of the defined types were placed in this encounter.    No orders of the defined types were placed in this encounter.      Shawnie Dapper, FNP-C 02/14/2023, 4:00 PM [facility] [address], [address] [place], [place] [address] [phone]

## 2023-02-16 ENCOUNTER — Encounter: Payer: Self-pay | Admitting: Family Medicine

## 2023-02-16 ENCOUNTER — Ambulatory Visit (INDEPENDENT_AMBULATORY_CARE_PROVIDER_SITE_OTHER): Payer: Self-pay | Admitting: Family Medicine

## 2023-02-16 VITALS — BP 122/63 | HR 76 | Ht 62.0 in | Wt 226.5 lb

## 2023-02-16 DIAGNOSIS — G4733 Obstructive sleep apnea (adult) (pediatric): Secondary | ICD-10-CM

## 2023-02-26 DIAGNOSIS — G4733 Obstructive sleep apnea (adult) (pediatric): Secondary | ICD-10-CM | POA: Diagnosis not present

## 2023-03-08 ENCOUNTER — Other Ambulatory Visit: Payer: Self-pay | Admitting: Family

## 2023-03-21 ENCOUNTER — Other Ambulatory Visit: Payer: Self-pay | Admitting: Family

## 2023-03-25 DIAGNOSIS — Z113 Encounter for screening for infections with a predominantly sexual mode of transmission: Secondary | ICD-10-CM | POA: Diagnosis not present

## 2023-03-31 ENCOUNTER — Other Ambulatory Visit: Payer: Self-pay | Admitting: Family

## 2023-03-31 ENCOUNTER — Encounter: Payer: Self-pay | Admitting: Family

## 2023-03-31 MED ORDER — BLOOD GLUCOSE MONITORING SUPPL DEVI: 1.0000 | Freq: Three times a day (TID) | 0 refills | Status: DC

## 2023-03-31 MED ORDER — LANCETS MISC. MISC: 1.0000 | Freq: Three times a day (TID) | 0 refills | Status: AC

## 2023-03-31 MED ORDER — LANCET DEVICE MISC: 1.0000 | Freq: Three times a day (TID) | 0 refills | Status: AC

## 2023-03-31 MED ORDER — BLOOD GLUCOSE TEST VI STRP: 1.0000 | ORAL_STRIP | Freq: Three times a day (TID) | 0 refills | Status: AC

## 2023-04-13 ENCOUNTER — Other Ambulatory Visit: Payer: Self-pay | Admitting: Medical Genetics

## 2023-04-28 ENCOUNTER — Encounter: Payer: Self-pay | Admitting: Family

## 2023-05-02 ENCOUNTER — Other Ambulatory Visit: Payer: Self-pay | Admitting: Family

## 2023-05-02 DIAGNOSIS — M25552 Pain in left hip: Secondary | ICD-10-CM

## 2023-05-02 DIAGNOSIS — I10 Essential (primary) hypertension: Secondary | ICD-10-CM

## 2023-05-02 MED ORDER — LOSARTAN POTASSIUM 100 MG PO TABS: 100.0000 mg | ORAL_TABLET | Freq: Every day | ORAL | 3 refills | Status: AC

## 2023-05-02 MED ORDER — CETIRIZINE HCL 10 MG PO TABS: 10.0000 mg | ORAL_TABLET | Freq: Every day | ORAL | 3 refills | Status: AC

## 2023-05-02 MED ORDER — PANTOPRAZOLE SODIUM 40 MG PO TBEC: 40.0000 mg | DELAYED_RELEASE_TABLET | Freq: Two times a day (BID) | ORAL | 3 refills | Status: AC

## 2023-05-02 MED ORDER — METFORMIN HCL 500 MG PO TABS: 500.0000 mg | ORAL_TABLET | Freq: Every day | ORAL | 3 refills | Status: AC

## 2023-05-02 MED ORDER — MELOXICAM 15 MG PO TABS: 15.0000 mg | ORAL_TABLET | Freq: Every day | ORAL | 3 refills | Status: DC

## 2023-05-02 MED ORDER — ROSUVASTATIN CALCIUM 10 MG PO TABS: 10.0000 mg | ORAL_TABLET | Freq: Every day | ORAL | 3 refills | Status: DC

## 2023-05-11 ENCOUNTER — Other Ambulatory Visit (HOSPITAL_COMMUNITY): Payer: Self-pay

## 2023-05-16 ENCOUNTER — Other Ambulatory Visit (HOSPITAL_COMMUNITY): Payer: Self-pay

## 2023-05-17 ENCOUNTER — Other Ambulatory Visit: Payer: Self-pay | Admitting: Medical Genetics

## 2023-05-17 ENCOUNTER — Other Ambulatory Visit: Payer: Self-pay

## 2023-05-17 DIAGNOSIS — Z006 Encounter for examination for normal comparison and control in clinical research program: Secondary | ICD-10-CM

## 2023-05-20 ENCOUNTER — Telehealth: Payer: Self-pay

## 2023-05-20 NOTE — Telephone Encounter (Signed)
 Received- plan requires we call them directly at [phone] to initate PA- I may not have time today before I leave.

## 2023-05-20 NOTE — Telephone Encounter (Signed)
 Copied from CRM [id number]. Topic: Clinical - Medication Question >> May 20, 2023 11:48 AM Almira Coaster wrote: Reason for CRM: [facility] is calling to advise that pantoprazole (PROTONIX) 40 MG tablet prescription requires a prior auth. States they have submitted the request to make it easier for the office to complete through cover my meds.

## 2023-05-20 NOTE — Telephone Encounter (Signed)
 Mychart message sent to Pt- need copy of front and back of her prescription insurance card. Awaiting response.

## 2023-05-24 ENCOUNTER — Other Ambulatory Visit (HOSPITAL_COMMUNITY): Payer: Self-pay

## 2023-05-24 ENCOUNTER — Telehealth: Payer: Self-pay | Admitting: Pharmacy Technician

## 2023-05-24 NOTE — Telephone Encounter (Signed)
 Pharmacy Patient Advocate Encounter   Received notification from CoverMyMeds that prior authorization for PANTOPRAZOLE 40MG  TABLETS is required/requested.   Insurance verification completed.   The patient is insured through [employer]  .   [employer] company today and was advised that the plan allows 1 tablet daily. Per the plan the patient has to submit her own letter for rationale to exceed plan limits. They will not accept the request from the provider. Patient can contact plan at [phone] for guidance on how to submit her own appeal.

## 2023-05-26 NOTE — Telephone Encounter (Signed)
 PA has been completed by PA team, pt must reach out to insurance company to try to start an appeal herself.

## 2023-05-27 NOTE — Telephone Encounter (Signed)
 Mychart message has been sent to pt.

## 2023-05-29 LAB — GENECONNECT MOLECULAR SCREEN: Genetic Analysis Overall Interpretation: NEGATIVE

## 2023-06-03 ENCOUNTER — Other Ambulatory Visit: Payer: Self-pay | Admitting: Family

## 2023-06-03 DIAGNOSIS — Z1231 Encounter for screening mammogram for malignant neoplasm of breast: Secondary | ICD-10-CM

## 2023-06-07 IMAGING — MG MM DIGITAL SCREENING BILAT W/ TOMO AND CAD
6 of 10 series · 6 of 30 positions shown · non-contrast
Comparison: Previous exam(s).

CLINICAL DATA: Screening.

EXAM:
DIGITAL SCREENING BILATERAL MAMMOGRAM WITH TOMOSYNTHESIS AND CAD
TECHNIQUE: Bilateral screening digital craniocaudal and mediolateral oblique
mammograms were obtained. Bilateral screening digital breast
tomosynthesis was performed. The images were evaluated with
computer-aided detection.

[R MLO synth-2D]
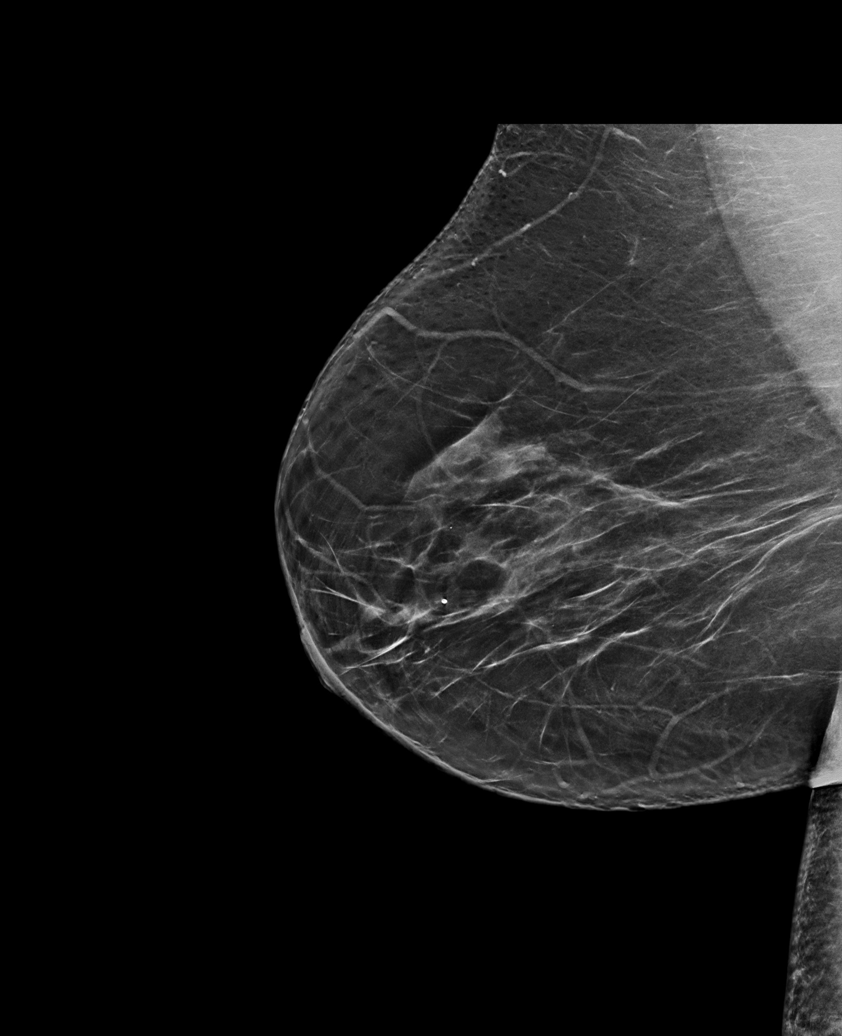

[L CC synth-2D (1 of 2)]
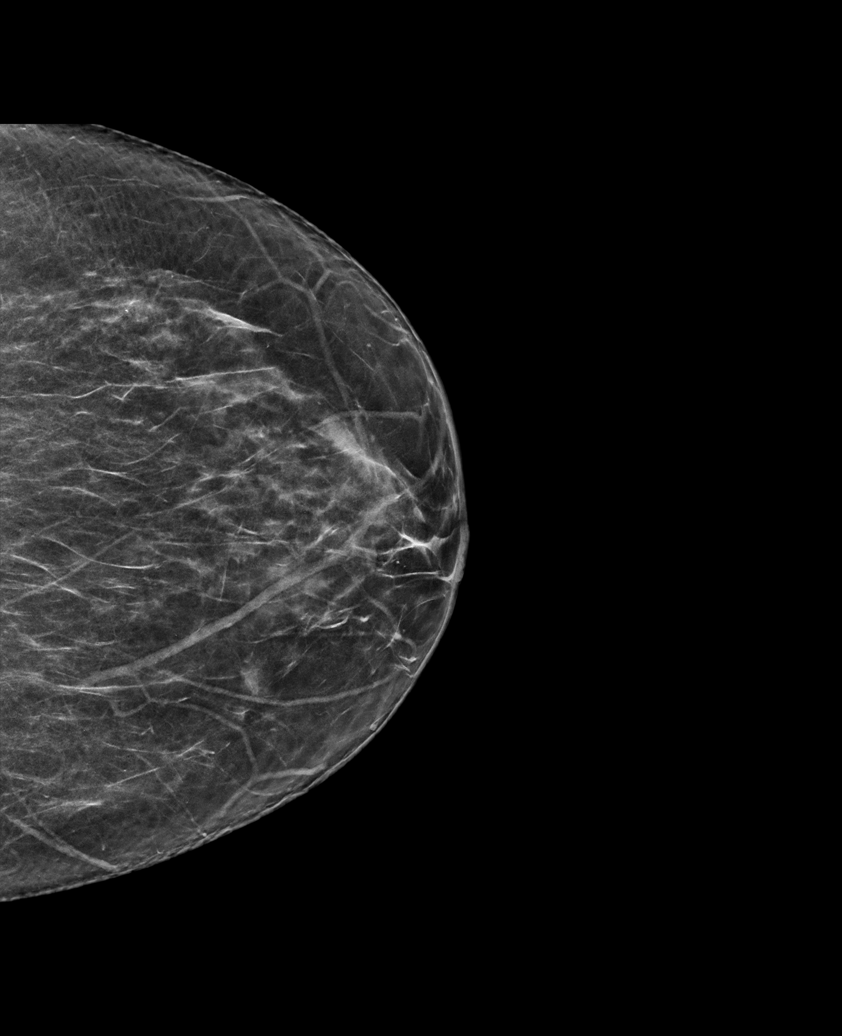

[R CC synth-2D]
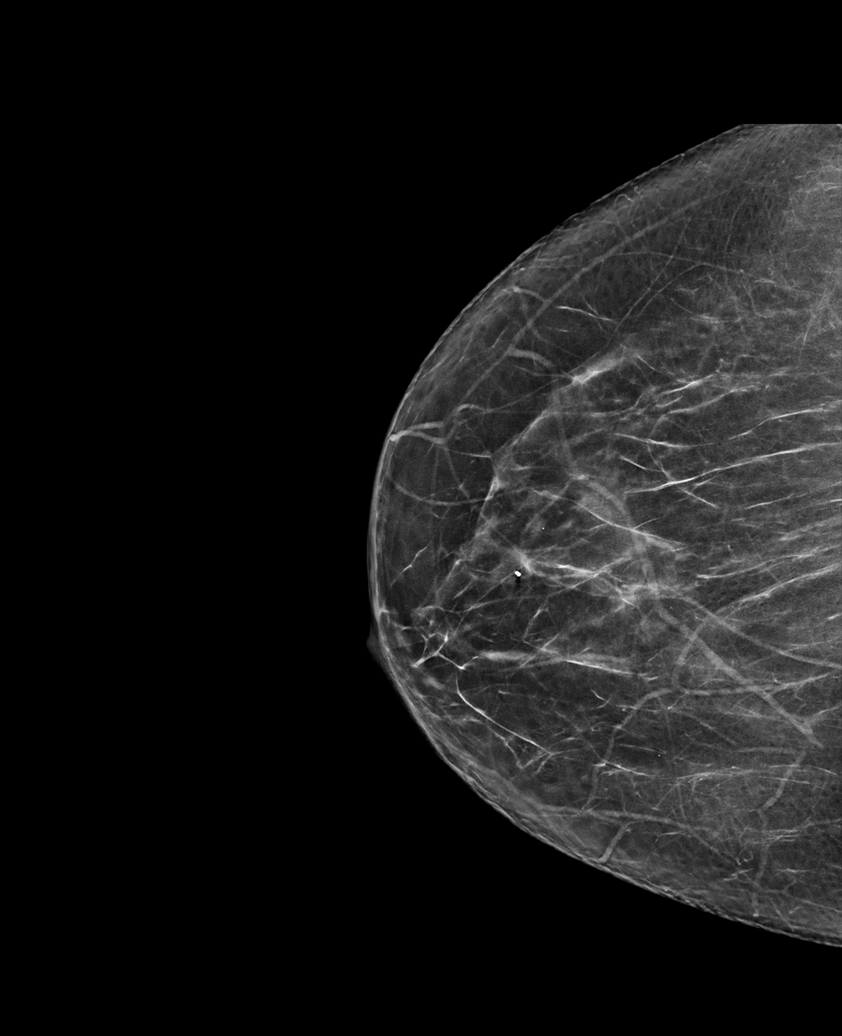

[L CC synth-2D (2 of 2)]
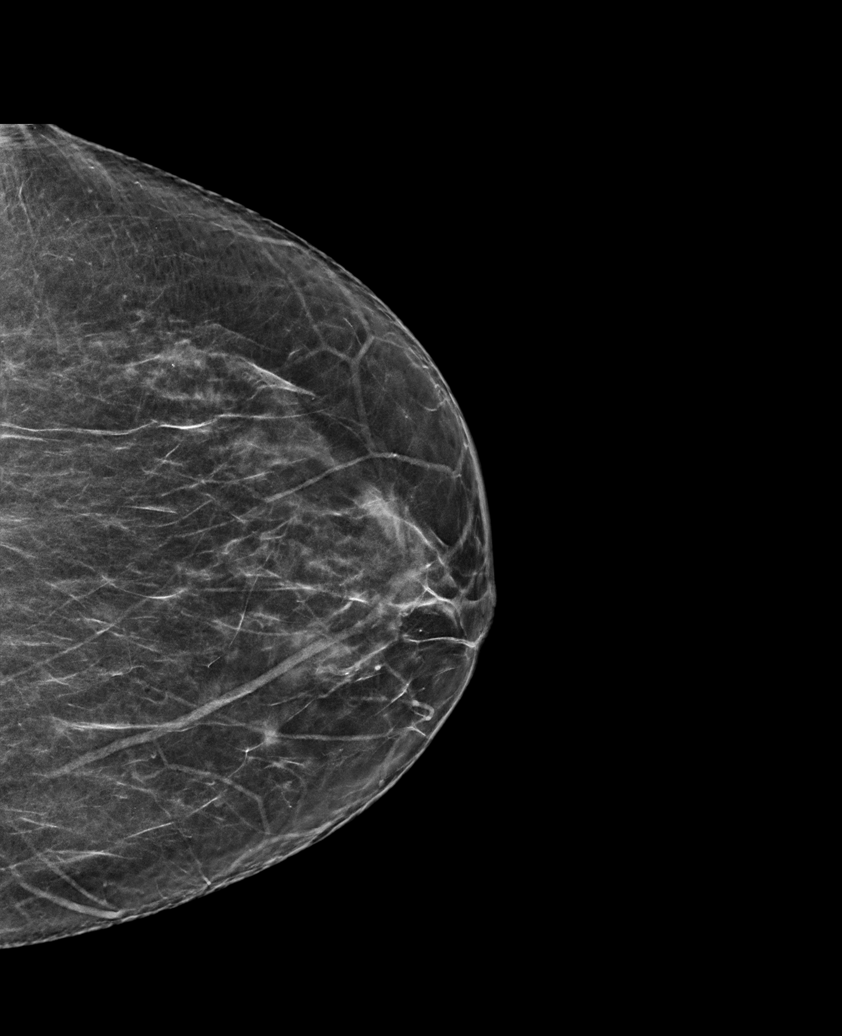

[L MLO synth-2D]
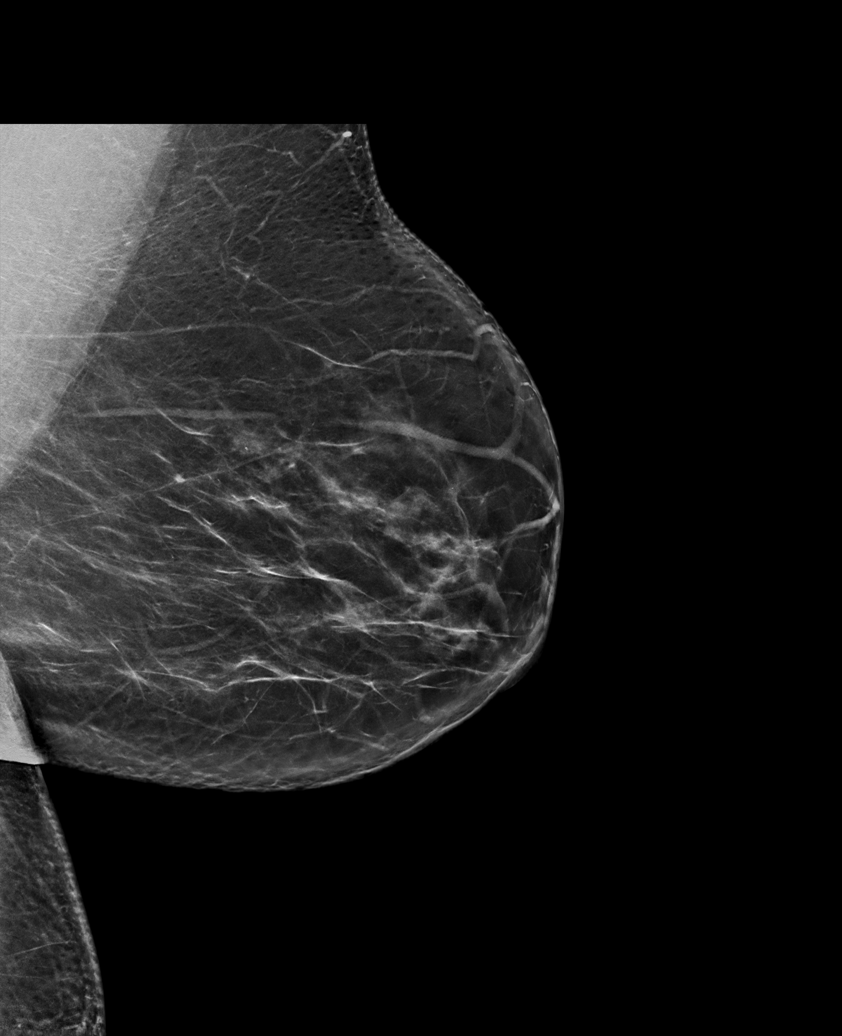

[R MLO tomo · tomo slice 39/78.0]
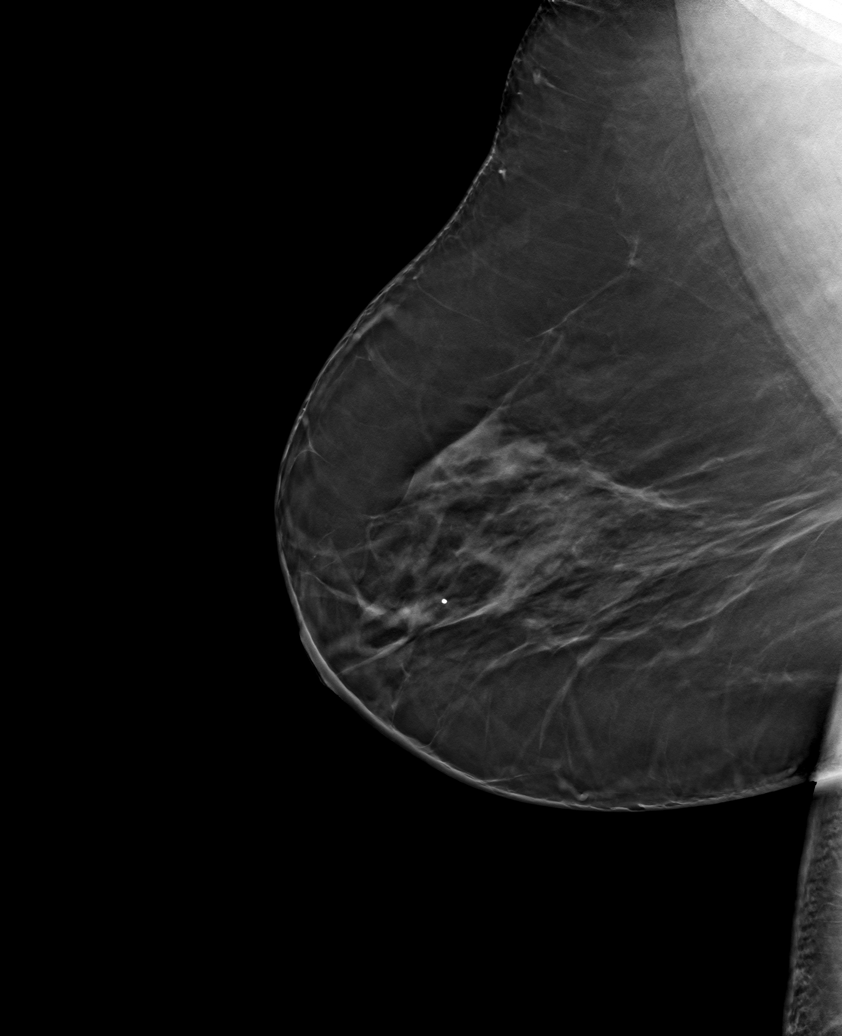

[6 of 30 positions shown; findings below may reference images not displayed]

ACR Breast Density Category b: There are scattered areas of
fibroglandular density.
FINDINGS: There are no findings suspicious for malignancy.
IMPRESSION: No mammographic evidence of malignancy. A result letter of this
screening mammogram will be mailed directly to the patient.

RECOMMENDATION:
Screening mammogram in one year. (Code:51-O-LD2)

BI-RADS CATEGORY  1: Negative.

## 2023-07-20 ENCOUNTER — Ambulatory Visit
Admission: RE | Admit: 2023-07-20 | Discharge: 2023-07-20 | Disposition: A | Discharge status: Home / Self Care | Payer: Self-pay | Source: Ambulatory Visit | Attending: Family | Admitting: Family

## 2023-07-20 DIAGNOSIS — Z1231 Encounter for screening mammogram for malignant neoplasm of breast: Secondary | ICD-10-CM

## 2023-07-25 ENCOUNTER — Other Ambulatory Visit: Payer: Self-pay | Admitting: Family

## 2023-07-25 DIAGNOSIS — R928 Other abnormal and inconclusive findings on diagnostic imaging of breast: Secondary | ICD-10-CM

## 2023-08-04 ENCOUNTER — Encounter

## 2023-08-04 ENCOUNTER — Other Ambulatory Visit

## 2023-08-17 ENCOUNTER — Ambulatory Visit
Admission: RE | Admit: 2023-08-17 | Discharge: 2023-08-17 | Disposition: A | Discharge status: Home / Self Care | Source: Ambulatory Visit | Attending: Family | Admitting: Family

## 2023-08-17 DIAGNOSIS — R928 Other abnormal and inconclusive findings on diagnostic imaging of breast: Secondary | ICD-10-CM

## 2023-09-08 ENCOUNTER — Encounter: Payer: Self-pay | Admitting: Cardiovascular Disease

## 2023-10-21 LAB — HM DIABETES EYE EXAM

## 2023-10-27 ENCOUNTER — Ambulatory Visit: Admitting: Cardiology

## 2023-11-17 ENCOUNTER — Ambulatory Visit: Admitting: Cardiology

## 2023-12-01 ENCOUNTER — Encounter: Payer: Self-pay | Admitting: Cardiology

## 2023-12-01 ENCOUNTER — Encounter: Payer: Self-pay | Admitting: Family Medicine

## 2023-12-01 ENCOUNTER — Other Ambulatory Visit: Payer: Self-pay

## 2023-12-01 DIAGNOSIS — M1990 Unspecified osteoarthritis, unspecified site: Secondary | ICD-10-CM | POA: Insufficient documentation

## 2023-12-01 DIAGNOSIS — I10 Essential (primary) hypertension: Secondary | ICD-10-CM | POA: Insufficient documentation

## 2023-12-01 DIAGNOSIS — M359 Systemic involvement of connective tissue, unspecified: Secondary | ICD-10-CM | POA: Insufficient documentation

## 2023-12-01 MED ORDER — ISOSORBIDE MONONITRATE ER 30 MG PO TB24: 30.0000 mg | ORAL_TABLET | Freq: Every day | ORAL | 0 refills | Status: DC

## 2023-12-01 MED ORDER — METOPROLOL TARTRATE 25 MG PO TABS: 25.0000 mg | ORAL_TABLET | Freq: Two times a day (BID) | ORAL | 0 refills | Status: DC

## 2023-12-01 NOTE — Telephone Encounter (Signed)
 RX sent in

## 2023-12-02 ENCOUNTER — Ambulatory Visit (INDEPENDENT_AMBULATORY_CARE_PROVIDER_SITE_OTHER): Admitting: Family Medicine

## 2023-12-02 ENCOUNTER — Encounter: Payer: Self-pay | Admitting: Family Medicine

## 2023-12-02 VITALS — BP 111/52 | HR 60 | Ht 62.0 in | Wt 234.0 lb

## 2023-12-02 DIAGNOSIS — E559 Vitamin D deficiency, unspecified: Secondary | ICD-10-CM

## 2023-12-02 DIAGNOSIS — R7303 Prediabetes: Secondary | ICD-10-CM

## 2023-12-02 DIAGNOSIS — E785 Hyperlipidemia, unspecified: Secondary | ICD-10-CM

## 2023-12-02 DIAGNOSIS — K219 Gastro-esophageal reflux disease without esophagitis: Secondary | ICD-10-CM | POA: Diagnosis not present

## 2023-12-02 DIAGNOSIS — E538 Deficiency of other specified B group vitamins: Secondary | ICD-10-CM

## 2023-12-02 DIAGNOSIS — I10 Essential (primary) hypertension: Secondary | ICD-10-CM

## 2023-12-02 DIAGNOSIS — M1990 Unspecified osteoarthritis, unspecified site: Secondary | ICD-10-CM

## 2023-12-02 DIAGNOSIS — Z0000 Encounter for general adult medical examination without abnormal findings: Secondary | ICD-10-CM

## 2023-12-02 DIAGNOSIS — G4733 Obstructive sleep apnea (adult) (pediatric): Secondary | ICD-10-CM

## 2023-12-02 LAB — CBC WITH DIFFERENTIAL/PLATELET
Basophils Absolute: 0 K/uL (ref 0.0–0.1)
Basophils Relative: 0.5 % (ref 0.0–3.0)
Eosinophils Absolute: 0.1 K/uL (ref 0.0–0.7)
Eosinophils Relative: 2.1 % (ref 0.0–5.0)
HCT: 37.7 % (ref 36.0–46.0)
Hemoglobin: 12.6 g/dL (ref 12.0–15.0)
Lymphocytes Relative: 39.1 % (ref 12.0–46.0)
Lymphs Abs: 2.3 K/uL (ref 0.7–4.0)
MCHC: 33.4 g/dL (ref 30.0–36.0)
MCV: 89.2 fl (ref 78.0–100.0)
Monocytes Absolute: 0.3 K/uL (ref 0.1–1.0)
Monocytes Relative: 5.9 % (ref 3.0–12.0)
Neutro Abs: 3 K/uL (ref 1.4–7.7)
Neutrophils Relative %: 52.4 % (ref 43.0–77.0)
Platelets: 249 K/uL (ref 150.0–400.0)
RBC: 4.22 Mil/uL (ref 3.87–5.11)
RDW: 14.2 % (ref 11.5–15.5)
WBC: 5.8 K/uL (ref 4.0–10.5)

## 2023-12-02 LAB — LIPID PANEL
Cholesterol: 175 mg/dL (ref 0–200)
HDL: 44.7 mg/dL (ref >39.00)
LDL Cholesterol: 93 mg/dL (ref 0–99)
NonHDL: 130.04
Total CHOL/HDL Ratio: 4
Triglycerides: 187 mg/dL — ABNORMAL HIGH (ref 0.0–149.0)
VLDL: 37.4 mg/dL (ref 0.0–40.0)

## 2023-12-02 LAB — COMPREHENSIVE METABOLIC PANEL WITH GFR
ALT: 14 U/L (ref 0–35)
AST: 16 U/L (ref 0–37)
Albumin: 4.1 g/dL (ref 3.5–5.2)
Alkaline Phosphatase: 60 U/L (ref 39–117)
BUN: 10 mg/dL (ref 6–23)
CO2: 27 meq/L (ref 19–32)
Calcium: 9 mg/dL (ref 8.4–10.5)
Chloride: 102 meq/L (ref 96–112)
Creatinine, Ser: 0.83 mg/dL (ref 0.40–1.20)
GFR: 79.03 mL/min (ref >60.00)
Glucose, Bld: 115 mg/dL — ABNORMAL HIGH (ref 70–99)
Potassium: 3.7 meq/L (ref 3.5–5.1)
Sodium: 139 meq/L (ref 135–145)
Total Bilirubin: 0.3 mg/dL (ref 0.2–1.2)
Total Protein: 7.2 g/dL (ref 6.0–8.3)

## 2023-12-02 LAB — VITAMIN D 25 HYDROXY (VIT D DEFICIENCY, FRACTURES): VITD: 38.74 ng/mL (ref 30.00–100.00)

## 2023-12-02 LAB — B12 AND FOLATE PANEL
Folate: 9.5 ng/mL (ref >5.9)
Vitamin B-12: 1264 pg/mL — ABNORMAL HIGH (ref 211–911)

## 2023-12-02 LAB — HEMOGLOBIN A1C: Hgb A1c MFr Bld: 6.4 % (ref 4.6–6.5)

## 2023-12-02 LAB — TSH: TSH: 2.07 u[IU]/mL (ref 0.35–5.50)

## 2023-12-02 NOTE — Assessment & Plan Note (Signed)
Blood pressure is at goal for age and co-morbidities.   Recommendations: continue current regimen - BP goal <130/80 - monitor and log blood pressures at home - check around the same time each day in a relaxed setting - Limit salt to <2000 mg/day - Follow DASH eating plan (heart healthy diet) - limit alcohol to 2 standard drinks per day for men and 1 per day for women - avoid tobacco products - get at least 2 hours of regular aerobic exercise weekly Patient aware of signs/symptoms requiring further/urgent evaluation. Labs updated today.  

## 2023-12-02 NOTE — Progress Notes (Signed)
 Established Patient Office Visit - Transfer of Care  Subjective   Patient ID: Amanda [name], female    DOB: [birth year]  Age: 58 y.o. MRN: [id number]  Chief Complaint  Patient presents with   Establish Care    HPI   Patient is here for transfer of care. She has been doing well overall (currently recovering from COVID).   Hypertension: - Medications: Imdur  20 mg daily, losartan  100 mg daily, metoprolol  20 mg BID - Compliance: good - Denies any SOB, recurrent headaches, CP, vision changes, LE edema, dizziness, palpitations, or medication side effects. - Follows with [facility] - sees them later this month   Hyperlipidemia: - medications: rosuvastatin  10 mg daily - compliance: good - medication SEs:  none The ASCVD Risk score (Arnett DK, et al., 2019) failed to calculate for the following reasons:   The valid total cholesterol range is 130 to 320 mg/dL   Prediabetes: - Checking glucose at home: no - Medications: metformin  500 mg daily - Compliance: good - Denies symptoms of hypoglycemia, polyuria, polydipsia, numbness extremities, foot ulcers/trauma, wounds that are not healing, medication side effects  Lab Results  Component Value Date   HGBA1C 5.6 08/05/2022    GERD: - management: Protonix  40 mg BID - symptoms: well controlled    OSA: - management: CPAP - symptoms: none   Vitamin D  deficiency: - management: 1,000 units D3 daily - symptoms: none   Vitamin B12 deficiency: - management: OTC supplement daily - symptoms: none         ROS All review of systems negative except what is listed in the HPI    Objective:     BP (!) 111/52   Pulse 60   Ht 5' 2 (1.575 m)   Wt 234 lb (106.1 kg)   SpO2 100%   BMI 42.80 kg/m    Physical Exam Vitals reviewed.  Constitutional:      General: She is not in acute distress.    Appearance: Normal appearance. She is obese. She is not ill-appearing.  Cardiovascular:     Rate and Rhythm:  Normal rate and regular rhythm.     Heart sounds: Normal heart sounds.  Pulmonary:     Effort: Pulmonary effort is normal.     Breath sounds: Normal breath sounds.  Skin:    General: Skin is warm and dry.  Neurological:     Mental Status: She is alert and oriented to person, place, and time.  Psychiatric:        Mood and Affect: Mood normal.        Behavior: Behavior normal.        Thought Content: Thought content normal.        Judgment: Judgment normal.          No results found for any visits on 12/02/23.    The ASCVD Risk score (Arnett DK, et al., 2019) failed to calculate for the following reasons:   The valid total cholesterol range is 130 to 320 mg/dL    Assessment & Plan:   Problem List Items Addressed This Visit       Active Problems   Benign essential hypertension (Chronic)   Blood pressure is at goal for age and co-morbidities.   Recommendations: continue current regimen - BP goal <130/80 - monitor and log blood pressures at home - check around the same time each day in a relaxed setting - Limit salt to <2000 mg/day - Follow DASH eating plan (heart healthy diet) -  limit alcohol to 2 standard drinks per day for men and 1 per day for women - avoid tobacco products - get at least 2 hours of regular aerobic exercise weekly Patient aware of signs/symptoms requiring further/urgent evaluation. Labs updated today.       Relevant Orders   CBC with Differential/Platelet   Comprehensive metabolic panel with GFR   TSH   GERD (gastroesophageal reflux disease)   GERD managed with Protonix . - Continue Protonix  40 mg twice a day.      Hyperlipidemia with target LDL less than 100   Medication management: Crestor  10 mg daily Lifestyle factors for lowering cholesterol include: Diet therapy - heart-healthy diet rich in fruits, veggies, fiber-rich whole grains, lean meats, chicken, fish (at least twice a week), fat-free or 1% dairy products; foods low in  saturated/trans fats, cholesterol, sodium, and sugar. Mediterranean diet has shown to be very heart healthy. Regular exercise - recommend at least 30 minutes a day, 5 times per week Weight management  Repeat CMP and lipid panel today       Relevant Orders   Lipid panel   Obstructive sleep apnea treated with continuous positive airway pressure (CPAP)   Sleep apnea well controlled with CPAP. - Continue using CPAP nightly.      Pre-diabetes   Prediabetes managed with metformin  and lifestyle measures.  - Continue metformin  500 mg once a day.      Relevant Orders   Hemoglobin A1c   Osteoarthritis   Occasionally needs meloxicam       B12 deficiency   Supplement and monitor      Relevant Orders   B12 and Folate Panel   Vitamin D  deficiency - Primary   Supplement and monitor      Relevant Orders   VITAMIN D  25 Hydroxy (Vit-D Deficiency, Fractures)              Return in about 6 weeks (around 01/13/2024) for physical, chronic disease management.    Waddell KATHEE Mon, NP

## 2023-12-02 NOTE — Assessment & Plan Note (Signed)
 Sleep apnea well controlled with CPAP. - Continue using CPAP nightly.

## 2023-12-02 NOTE — Assessment & Plan Note (Signed)
 Supplement and monitor

## 2023-12-02 NOTE — Assessment & Plan Note (Signed)
 Prediabetes managed with metformin  and lifestyle measures.  - Continue metformin  500 mg once a day.

## 2023-12-02 NOTE — Assessment & Plan Note (Signed)
 GERD managed with Protonix . - Continue Protonix  40 mg twice a day.

## 2023-12-02 NOTE — Assessment & Plan Note (Signed)
 Occasionally needs meloxicam 

## 2023-12-02 NOTE — Assessment & Plan Note (Signed)
Medication management: Crestor 10 mg daily Lifestyle factors for lowering cholesterol include: Diet therapy - heart-healthy diet rich in fruits, veggies, fiber-rich whole grains, lean meats, chicken, fish (at least twice a week), fat-free or 1% dairy products; foods low in saturated/trans fats, cholesterol, sodium, and sugar. Mediterranean diet has shown to be very heart healthy. Regular exercise - recommend at least 30 minutes a day, 5 times per week Weight management  Repeat CMP and lipid panel today

## 2023-12-05 ENCOUNTER — Ambulatory Visit: Payer: Self-pay | Admitting: Family Medicine

## 2023-12-16 ENCOUNTER — Other Ambulatory Visit (HOSPITAL_COMMUNITY): Payer: Self-pay

## 2023-12-16 ENCOUNTER — Encounter: Payer: Self-pay | Admitting: Cardiology

## 2023-12-16 ENCOUNTER — Ambulatory Visit: Attending: Cardiology | Admitting: Cardiology

## 2023-12-16 VITALS — BP 114/70 | HR 57 | Ht 62.0 in | Wt 236.4 lb

## 2023-12-16 DIAGNOSIS — I2510 Atherosclerotic heart disease of native coronary artery without angina pectoris: Secondary | ICD-10-CM | POA: Diagnosis not present

## 2023-12-16 DIAGNOSIS — I10 Essential (primary) hypertension: Secondary | ICD-10-CM

## 2023-12-16 DIAGNOSIS — I2584 Coronary atherosclerosis due to calcified coronary lesion: Secondary | ICD-10-CM

## 2023-12-16 DIAGNOSIS — E782 Mixed hyperlipidemia: Secondary | ICD-10-CM | POA: Diagnosis not present

## 2023-12-16 MED ORDER — ROSUVASTATIN CALCIUM 20 MG PO TABS
20.0000 mg | ORAL_TABLET | Freq: Every day | ORAL | 1 refills | Status: AC
  Filled 2023-12-16: qty 30, 30d supply, fill #0
  Filled 2024-01-10: qty 30, 30d supply, fill #1

## 2023-12-16 NOTE — Patient Instructions (Signed)
 Medication Instructions:  STOP Aspirin  STOP Isosorbide  STOP Nitroglycerin   INCREASE Crestor  to 20 mg daily   *If you need a refill on your cardiac medications before your next appointment, please call your pharmacy*   Follow-Up: At [facility], you and your health needs are our priority.  As part of our continuing mission to provide you with exceptional heart care, our providers are all part of one team.  This team includes your primary Cardiologist (physician) and Advanced Practice Providers or APPs (Physician Assistants and Nurse Practitioners) who all work together to provide you with the care you need, when you need it.  Your next appointment:   As needed   Provider:   Newman JINNY Lawrence, MD

## 2023-12-16 NOTE — Progress Notes (Signed)
 Cardiology Office Note:  .   Date:  12/16/2023  ID:  Amanda [name], DOB 07/18/1965, MRN [id number] PCP: Almarie Waddell NOVAK, NP  [facility] Providers Cardiologist:  Newman Lawrence, MD PCP: Almarie Waddell NOVAK, NP  Chief Complaint  Patient presents with   Hypertension     Amanda [name] is a 58 y.o. female with hypertension, hyperlipidemia, prediabetes, mild coronary calcification   History of Present Illness  Patient is overall coming from COVID that she had at the end of August.  Only during COVID, she had occasional sharp chest pain and shortness of breath.  Otherwise, she denies any chest pain shortness of breath.  Her blood pressure has been running low lately.  With that, she has had occasional lightheadedness episodes.  Separately, she is perimenopausal and wonders if hormone replacement therapy would be safe for her.     Vitals:   12/16/23 1620  BP: 114/70  Pulse: (!) 57  SpO2: 97%      Review of Systems  Cardiovascular:  Negative for chest pain, dyspnea on exertion, leg swelling, palpitations and syncope.        Studies Reviewed: SABRA        EKG 12/16/2023: Sinus rhythm 57 bpm Nonspecific T wave abnormality When compared with ECG of 18-Oct-2022 10:55, Nonspecific T wave abnormality now evident in Lateral leads   CCTA 2023: 1. Coronary calcium  score of 1.37. This was 66 percentile for age-, sex, and race-matched controls. 2. Normal coronary origin with right dominance. 3. Minimal CAD.  Echocardiogram 2023:  1. Left ventricular ejection fraction, by estimation, is 60 to 65%. The  left ventricle has normal function. The left ventricle has no regional  wall motion abnormalities. There is moderate left ventricular hypertrophy.  Left ventricular diastolic  parameters were normal.   2. Right ventricular systolic function is normal. The right ventricular  size is normal. Tricuspid regurgitation signal is inadequate for assessing  PA pressure.    3. The mitral valve is normal in structure. Mild mitral valve  regurgitation.   4. The aortic valve is normal in structure. Aortic valve regurgitation is  not visualized.   5. The inferior vena cava is normal in size with greater than 50%  respiratory variability, suggesting right atrial pressure of 3 mmHg.   Comparison(s): No prior Echocardiogram.   Labs 11/2023: Chol 175, TG 187, HDL 44, LDL 93 HbA1C 6.4% Hb 12.6 Cr 0.83 TSH 2.0   Physical Exam Vitals and nursing note reviewed.  Constitutional:      General: She is not in acute distress. Neck:     Vascular: No JVD.  Cardiovascular:     Rate and Rhythm: Normal rate and regular rhythm.     Heart sounds: Normal heart sounds. No murmur heard. Pulmonary:     Effort: Pulmonary effort is normal.     Breath sounds: Normal breath sounds. No wheezing or rales.  Musculoskeletal:     Right lower leg: No edema.     Left lower leg: No edema.      VISIT DIAGNOSES:   ICD-10-CM   1. Primary hypertension  I10 EKG 12-Lead    2. Mixed hyperlipidemia  E78.2     3. Coronary artery calcification of native artery  I25.10    I25.84        Amanda [name] is a 58 y.o. female with hypertension, hyperlipidemia, prediabetes, mild coronary calcification Assessment & Plan  Coronary calcification: Mild.  No anginal symptoms.  Okay to stop  aspirin and Imdur .  I expect her lightheadedness to improve with this. Continue statin, increased from Crestor  10 mg daily to 20 mg daily with goal LDL <70. Crestor  prescription can be taken over by her PCP going forward. With her perimenopausal symptoms and no obstructive CAD, I do think hormone replacement therapy benefits outweigh the risks, especially if combination estrogen/progesterone.  Avoid estrogen only hormone replacement therapy.  Hypertension: Continue losartan .  Stopped Imdur . In future, may be able to wean off metoprolol  as well.  Mixed hyperlipidemia: As above.  Meds ordered  this encounter  Medications   rosuvastatin  (CRESTOR ) 20 MG tablet    Sig: Take 1 tablet (20 mg total) by mouth daily.    Dispense:  90 tablet    Refill:  1    Continue follow-up with PCP.  I will see her as needed.  Signed, Newman JINNY Lawrence, MD

## 2023-12-22 ENCOUNTER — Encounter: Payer: Self-pay | Admitting: Family Medicine

## 2023-12-22 DIAGNOSIS — R232 Flushing: Secondary | ICD-10-CM

## 2023-12-22 DIAGNOSIS — N951 Menopausal and female climacteric states: Secondary | ICD-10-CM

## 2024-01-10 ENCOUNTER — Other Ambulatory Visit (HOSPITAL_COMMUNITY): Payer: Self-pay

## 2024-01-13 ENCOUNTER — Other Ambulatory Visit (HOSPITAL_COMMUNITY): Payer: Self-pay

## 2024-02-16 ENCOUNTER — Ambulatory Visit: Payer: Self-pay

## 2024-02-16 NOTE — Telephone Encounter (Signed)
 Appt scheduled

## 2024-02-16 NOTE — Telephone Encounter (Signed)
 FYI Only or Action Required?: FYI only for provider: appointment scheduled on 12/04.  Patient was last seen in primary care on 12/02/2023 by Almarie Waddell NOVAK, NP.  Called Nurse Triage reporting Fatigue.  Symptoms began several months ago.  Symptoms are: gradually worsening.  Triage Disposition: See PCP Within 2 Weeks  Patient/caregiver understands and will follow disposition?: Yes     Copied from CRM #[id number]. Topic: Clinical - Red Word Triage >> Feb 16, 2024 12:33 PM Alfonso ORN wrote: Red Word that prompted transfer to Nurse Triage:  weak, fatigue, light headed , nauseated comes and goes . symptoms were seen by pcp previously now more frequently .        Reason for Disposition  Fatigue is a chronic symptom (recurrent or ongoing AND present > 4 weeks)  Answer Assessment - Initial Assessment Questions Patient only available on certain days due to her work schedule. First available appointment scheduled for patient and placed on the wait list. Patient instructed to call back for new or worsening symptoms. Patient verbalized understanding and agreement with this plan.     1. DESCRIPTION: Describe how you are feeling.     Fatigued  2. SEVERITY: How bad is it?  Can you stand and walk?     Mild to moderate  3. ONSET: When did these symptoms begin? (e.g., hours, days, weeks, months)     2-3 months ago  4. CAUSE: What do you think is causing the weakness or fatigue? (e.g., not drinking enough fluids, medical problem, trouble sleeping)     Unsure  5. NEW MEDICINES:  Have you started on any new medicines recently? (e.g., opioid pain medicines, benzodiazepines, muscle relaxants, antidepressants, antihistamines, neuroleptics, beta blockers)     No 6. OTHER SYMPTOMS: Do you have any other symptoms? (e.g., chest pain, fever, cough, SOB, vomiting, diarrhea, bleeding, other areas of pain)     Intermittent dizziness, nausea, and weakness  Protocols used: Weakness (Generalized)  and Fatigue-A-AH

## 2024-02-21 ENCOUNTER — Ambulatory Visit: Payer: BC Managed Care – PPO | Admitting: Family Medicine

## 2024-03-01 ENCOUNTER — Encounter: Payer: Self-pay | Admitting: Family Medicine

## 2024-03-01 ENCOUNTER — Ambulatory Visit: Admitting: Family Medicine

## 2024-03-01 ENCOUNTER — Encounter: Admitting: Family Medicine

## 2024-03-01 VITALS — BP 113/73 | HR 52 | Ht 62.0 in | Wt 238.0 lb

## 2024-03-01 DIAGNOSIS — I10 Essential (primary) hypertension: Secondary | ICD-10-CM | POA: Diagnosis not present

## 2024-03-01 DIAGNOSIS — Z23 Encounter for immunization: Secondary | ICD-10-CM | POA: Diagnosis not present

## 2024-03-01 DIAGNOSIS — R5383 Other fatigue: Secondary | ICD-10-CM | POA: Diagnosis not present

## 2024-03-01 LAB — IBC + FERRITIN
Ferritin: 28.8 ng/mL (ref 10.0–291.0)
Iron: 78 ug/dL (ref 42–145)
Saturation Ratios: 18.6 % — ABNORMAL LOW (ref 20.0–50.0)
TIBC: 418.6 ug/dL (ref 250.0–450.0)
Transferrin: 299 mg/dL (ref 212.0–360.0)

## 2024-03-01 LAB — COMPREHENSIVE METABOLIC PANEL WITH GFR
ALT: 14 U/L (ref 0–35)
AST: 16 U/L (ref 0–37)
Albumin: 4.3 g/dL (ref 3.5–5.2)
Alkaline Phosphatase: 48 U/L (ref 39–117)
BUN: 13 mg/dL (ref 6–23)
CO2: 30 meq/L (ref 19–32)
Calcium: 9.5 mg/dL (ref 8.4–10.5)
Chloride: 103 meq/L (ref 96–112)
Creatinine, Ser: 0.85 mg/dL (ref 0.40–1.20)
GFR: 76.68 mL/min (ref >60.00)
Glucose, Bld: 101 mg/dL — ABNORMAL HIGH (ref 70–99)
Potassium: 4 meq/L (ref 3.5–5.1)
Sodium: 140 meq/L (ref 135–145)
Total Bilirubin: 0.6 mg/dL (ref 0.2–1.2)
Total Protein: 6.8 g/dL (ref 6.0–8.3)

## 2024-03-01 LAB — CBC WITH DIFFERENTIAL/PLATELET
Basophils Absolute: 0 K/uL (ref 0.0–0.1)
Basophils Relative: 0.7 % (ref 0.0–3.0)
Eosinophils Absolute: 0.1 K/uL (ref 0.0–0.7)
Eosinophils Relative: 2.2 % (ref 0.0–5.0)
HCT: 36.9 % (ref 36.0–46.0)
Hemoglobin: 12.5 g/dL (ref 12.0–15.0)
Lymphocytes Relative: 29.6 % (ref 12.0–46.0)
Lymphs Abs: 1.9 K/uL (ref 0.7–4.0)
MCHC: 33.9 g/dL (ref 30.0–36.0)
MCV: 89 fl (ref 78.0–100.0)
Monocytes Absolute: 0.5 K/uL (ref 0.1–1.0)
Monocytes Relative: 7.6 % (ref 3.0–12.0)
Neutro Abs: 3.9 K/uL (ref 1.4–7.7)
Neutrophils Relative %: 59.9 % (ref 43.0–77.0)
Platelets: 230 K/uL (ref 150.0–400.0)
RBC: 4.15 Mil/uL (ref 3.87–5.11)
RDW: 14.4 % (ref 11.5–15.5)
WBC: 6.5 K/uL (ref 4.0–10.5)

## 2024-03-01 LAB — TSH: TSH: 2.42 u[IU]/mL (ref 0.35–5.50)

## 2024-03-01 LAB — B12 AND FOLATE PANEL
Folate: 10.9 ng/mL (ref >5.9)
Vitamin B-12: 705 pg/mL (ref 211–911)

## 2024-03-01 MED ORDER — METOPROLOL TARTRATE 25 MG PO TABS: 12.5000 mg | ORAL_TABLET | Freq: Two times a day (BID) | ORAL | Status: DC

## 2024-03-01 NOTE — Progress Notes (Signed)
 Acute Office Visit  Subjective:  Patient ID: Amanda [name], female    DOB: 16-Nov-1965  Age: 58 y.o. MRN: [id number]  CC:  Chief Complaint  Patient presents with   Fatigue      HPI Amanda [name] is here for fatigue.  Discussed the use of AI scribe software for clinical note transcription with the patient, who gave verbal consent to proceed.  History of Present Illness Amanda [name] is a 58 year old female with sleep apnea and hypertension who presents with persistent fatigue and headaches.  She has been experiencing persistent fatigue for several months, which has worsened since her last visit in September. The fatigue is severe enough to cause her to miss work or leave early on occasion.  She reports new onset headaches that are intermittent and sometimes accompanied by nausea. No chest pain, shortness of breath, or vision changes. She experiences occasional lightheadedness.  She has a history of sleep apnea and uses a CPAP machine nightly. Despite using the CPAP, she wakes up once or twice during the night but is able to fall back asleep. She maintains adequate hydration, drinking about four bottles of water daily, and reports eating and drinking adequately.  Her past medical history includes hypertension, for which she has been on metoprolol  and losartan  (cardiology d/c'd Imdur  at last visit due to lightheadedness/headaches). She does not monitor her blood pressure or heart rate at home.  She has a history of weight fluctuations, having lost 65 pounds previously but regaining about 30 pounds recently.        Past Medical History:  Diagnosis Date   Allergy    Diverticulitis    GERD (gastroesophageal reflux disease) 1993   Heart murmur    Hypertension 1993   Leaky heart valve    Sleep apnea    Vitamin D  deficiency 2011    Past Surgical History:  Procedure Laterality Date   ABDOMINAL HYSTERECTOMY     CHOLECYSTECTOMY     CHOLECYSTECTOMY,  LAPAROSCOPIC     TUBAL LIGATION      Family History  Problem Relation Age of Onset   Hypertension Mother    Lupus Mother    Hypertension Father    Diabetes Father    Heart disease Father    Prostate cancer Father    Asthma Sister    Breast cancer Sister 42   Diabetes Sister    Lung cancer Sister    Hypertension Sister    Asthma Brother    Leukemia Brother    Diabetes Brother    Hypertension Brother    Breast cancer Niece 46   Breast cancer Niece 32    Social History   Socioeconomic History   Marital status: Widowed    Spouse name: Not on file   Number of children: 2   Years of education: GED   Highest education level: Not on file  Occupational History   Occupation: Location Manager  Tobacco Use   Smoking status: Never   Smokeless tobacco: Never  Substance and Sexual Activity   Alcohol use: No   Drug use: No   Sexual activity: Not on file  Other Topics Concern   Not on file  Social History Narrative   Lives in [place] with her sister. She works at [employer] company living.    Drinks about 2 caffeine drinks a week    Social Drivers of Corporate Investment Banker Strain: Not on file  Food Insecurity: Not on file  Transportation Needs: Not on file  Physical Activity: Not on file  Stress: Not on file  Social Connections: Not on file  Intimate Partner Violence: Not on file    ROS All ROS negative except what is listed in the HPI.   Objective:   Today's Vitals: BP 113/73   Pulse (!) 52   Ht 5' 2 (1.575 m)   Wt 238 lb (108 kg)   SpO2 99%   BMI 43.53 kg/m   Physical Exam Vitals reviewed.  Constitutional:      General: She is not in acute distress.    Appearance: Normal appearance. She is obese. She is not ill-appearing.  Cardiovascular:     Rate and Rhythm: Regular rhythm. Bradycardia present.     Heart sounds: Normal heart sounds.  Pulmonary:     Effort: Pulmonary effort is normal.     Breath sounds: Normal breath sounds.  Skin:     General: Skin is warm and dry.  Neurological:     Mental Status: She is alert and oriented to person, place, and time.  Psychiatric:        Mood and Affect: Mood normal.        Behavior: Behavior normal.        Thought Content: Thought content normal.        Judgment: Judgment normal.        Assessment & Plan:   Problem List Items Addressed This Visit       Active Problems   Benign essential hypertension (Chronic)   HTN well controlled, but bradycardic and fatigued. For one month, cut metoprolol  pills in half and then send me a message with average BP/HR and let me know if fatigue is improving - then we will either continue reduced dose or d/c altogether.       Relevant Medications   metoprolol  tartrate (LOPRESSOR ) 25 MG tablet   Other Visit Diagnoses       Other fatigue    -  Primary Labs today B12 was high at last check so she has only been taking every other day - recheck today Reduce metoprolol  dose by half and monitor BP/HR   Relevant Orders   CBC with Differential/Platelet   Comprehensive metabolic panel with GFR   B12 and Folate Panel   IBC + Ferritin   TSH     Immunization due       Relevant Orders   Flu vaccine trivalent PF, 6mos and older(Flulaval,Afluria,Fluarix,Fluzone) (Completed)   Pneumococcal conjugate vaccine 20-valent (Prevnar 20) (Completed)         Follow-up: Return if symptoms worsen or fail to improve.   Amanda FURY Almarie, DNP, FNP-C  I,Emily Lagle,acting as a neurosurgeon for Amanda KATHEE Almarie, NP.,have documented all relevant documentation on the behalf of Amanda KATHEE Almarie, NP.   I, Amanda KATHEE Almarie, NP, have reviewed all documentation for this visit. The documentation on 03/01/2024 for the exam, diagnosis, procedures, and orders are all accurate and complete.

## 2024-03-01 NOTE — Assessment & Plan Note (Signed)
 HTN well controlled, but bradycardic and fatigued. For one month, cut metoprolol  pills in half and then send me a message with average BP/HR and let me know if fatigue is improving - then we will either continue reduced dose or d/c altogether.

## 2024-03-02 ENCOUNTER — Ambulatory Visit: Payer: Self-pay | Admitting: Family Medicine

## 2024-03-08 NOTE — Progress Notes (Unsigned)
 Amanda [name]

## 2024-03-08 NOTE — Patient Instructions (Signed)

## 2024-03-08 NOTE — Progress Notes (Unsigned)
 PATIENT: Amanda [name] DOB: [birth year]  REASON FOR VISIT: follow up HISTORY FROM: patient  No chief complaint on file.    HISTORY OF PRESENT ILLNESS:  03/08/2024 ALL:  Amanda [name] returns for follow up for OSA on CPAP.     02/16/2023 ALL:  Amanda [name] returns for follow up for OSA on CPAP. She was last seen 09/2022 and eligible for a new machine. HST 10/2022 showed mild obstructive sleep apnea with a total AHI of 13.1/hour and O2 nadir of 90%. New autoPAP was ordered. She reports doing well with her new machine. She is resting well. No concerns with supplies. She did not have a sims card with new machine.     10/13/2022 ALL:  Amanda [name] returns for follow up for OSA on CPAP. She is doing fairly well. She does not love using CPAP but recognizes medical benefit. She has been curious about trying an oral appliance. She has lost about 30lbs since last visit. She was down to 65lb weight loss but had trouble with hip pain and unable to continue exercise. She has recently gotten hip pain under control and wishes to resume exercise. HST 2018 showed AHI 13.8/h and O2 nadir 72%. ONO showed resolution of hypoxia on CPAP.     06/15/2021 ALL: Amanda [name] returns for follow up for OSA on CPAP. She continues to do well on therapy. She is using her machine every night. She has been a little more tired and sleepy during the day. She was recently evaluated by cardiology for concerns of chest pain and shob. Dr Calhoun noted a murmur and has scheduled ECHO and CT. She was started on metoprolol  25mg  BID and Imdur  30mg  daily. She has follow up with PCP next week.     06/12/2020 ALL:  She returns for follow up for OSA on CPAP. She is doing very well. She was diagnosed with Covid in 03/2020 and treated for PNA 05/26/2020. She had a period of about 3-4 days where she could not use her CPAP due to coughing and felt that she did not sleep well at all. She does report feeling significantly better on CPAP therapy. No  concerns with machine or supplies.       06/13/2019 ALL:  Amanda [name] is a 58 y.o. female here today for follow up for OSA on CPAP therapy. Overall, she is doing well. She does endorse more fatigue and headaches recently. She wakes up at 3 days a week feeling exhausted. She reports having a mild headache about once weekly, frontal and throbbing. No migraine symptoms or vision changes. Headache resolves with Tylenol. BP has been slightly elevated. She was seen by cardiology and started on chlorthalidone  12.5mg  daily and potassium supplements. She has also noted significant hair loss that started about 2 years ago. She had labs with PCP that were normal. She was told it was related to stress. She had to place parents in nursing homes in the recent years. Her father died in in 09-25-2017.  She is working 12 hour shifts at work. She tries to drink 3 bottles of water daily.   Compliance report dated 05/13/2019 through 06/11/2019 reveals that she has used CPAP 30 of the last 30 days for compliance of 100%.  She used CPAP greater than 4 hours all 30 days for compliance 100%.  Average usage was 7 hours and 45 minutes.  Residual AHI was 1.5 on 12 cm of water and an EPR of 3.  There were no significant leaks noted.  HISTORY: (copied from [employer] [facility]'s note on 06/01/2018)  Amanda [name] is a 58 year old right-handed woman with an underlying medical history of hypertension, allergies, reflux disease, vitamin D  deficiency, and morbid obesity, who presents for follow-up consultation of her sleep disorder, after home sleep study testing and trial of AutoPap therapy. The patient is unaccompanied today. I last saw her on 08/10/2016, at which time she reported doing much better after starting AutoPap therapy. She felt that her sleep quality and sleep consolidation as well as daytime energy were improved as well as her morning headaches. She was motivated to work on her weight loss and continue with AutoPap. I  suggested we proceed with an overnight pulse oximetry test to monitor oxygen saturations for 1 night. She had a pulse ox on 08/24/2016 with a total duration of 5 hours and 56 minutes, average oxygen saturation of 97.3%, nadir of 92%.   Today, 02/10/2017: I reviewed her AutoPap compliance data from 01/10/2017 through 02/08/2017 which is a total of 30 days, during which time she used her machine every night with percent used days greater than 4 hours at 90%, indicating excellent compliance with an average usage of 7 hours and 34 minutes, residual AHI at goal at 1.6 per hour, pressure for the 95th percentile right at 12 cm, leak very low, pressure setting of 4-13 cm with EPR. She reports doing well, will start going to the gym. Had a bout of bronchitis, went to UC and had a Zpack. Could not use the machine then. Otherwise doing well. Would be okay trying CPAP of 12 cm, using nasal mask.   UPDATE 3/5/2020CM Amanda [name], 58 year old female returns for follow-up with history of obstructive sleep apnea here for CPAP compliance.  She is doing well with her CPAP.  She claims she has more sleepiness lately.  She was recently switched from an 8-hour shift to a 12-hour shift and she is still adjusting to this.  CPAP data dated 05/01/2018-05/30/2018 shows compliance greater than 4 hours at 83%.  Average usage 7 hours 19 minutes.  Set pressure 12 cm leak 95th percentile 0.9.  AHI 0.9.  The other 5 days she did not use the machine she had a cold.  She returns for reevaluation   REVIEW OF SYSTEMS: Out of a complete 14 system review of symptoms, the patient complains only of the following symptoms, sleepiness, and all other reviewed systems are negative.   ESS: 15/24, previously 9/24 FSS: 11/63   ALLERGIES: No Known Allergies  HOME MEDICATIONS: Outpatient Medications Prior to Visit  Medication Sig Dispense Refill   cetirizine  (ZYRTEC ) 10 MG tablet Take 1 tablet (10 mg total) by mouth daily. 90 tablet 3    cholecalciferol (VITAMIN D3) 25 MCG (1000 UNIT) tablet Take 1,000 Units by mouth daily.     losartan  (COZAAR ) 100 MG tablet Take 1 tablet (100 mg total) by mouth daily. 90 tablet 3   metFORMIN  (GLUCOPHAGE ) 500 MG tablet Take 1 tablet (500 mg total) by mouth daily. 90 tablet 3   Methylcobalamin (B12-ACTIVE PO) Take by mouth. 1 tablet per day [facility]     metoprolol  tartrate (LOPRESSOR ) 25 MG tablet Take 0.5 tablets (12.5 mg total) by mouth 2 (two) times daily.     ONETOUCH VERIO test strip USE UP TO FOUR TIMES A DAY AS DIRECTED 100 strip 13   pantoprazole  (PROTONIX ) 40 MG tablet Take 1 tablet (40 mg total) by mouth 2 (two) times daily. 180 tablet 3   rosuvastatin  (CRESTOR ) 20 MG  tablet Take 1 tablet (20 mg total) by mouth daily. 90 tablet 1   No facility-administered medications prior to visit.    PAST MEDICAL HISTORY: Past Medical History:  Diagnosis Date   Allergy    Diverticulitis    GERD (gastroesophageal reflux disease) 1993   Heart murmur    Hypertension 1993   Leaky heart valve    Sleep apnea    Vitamin D  deficiency 2011    PAST SURGICAL HISTORY: Past Surgical History:  Procedure Laterality Date   ABDOMINAL HYSTERECTOMY     CHOLECYSTECTOMY     CHOLECYSTECTOMY, LAPAROSCOPIC     TUBAL LIGATION      FAMILY HISTORY: Family History  Problem Relation Age of Onset   Hypertension Mother    Lupus Mother    Hypertension Father    Diabetes Father    Heart disease Father    Prostate cancer Father    Asthma Sister    Breast cancer Sister 62   Diabetes Sister    Lung cancer Sister    Hypertension Sister    Asthma Brother    Leukemia Brother    Diabetes Brother    Hypertension Brother    Breast cancer Niece 22   Breast cancer Niece 54    SOCIAL HISTORY: Social History   Socioeconomic History   Marital status: Widowed    Spouse name: Not on file   Number of children: 2   Years of education: GED   Highest education level: Not on file  Occupational History    Occupation: Location Manager  Tobacco Use   Smoking status: Never   Smokeless tobacco: Never  Substance and Sexual Activity   Alcohol use: No   Drug use: No   Sexual activity: Not on file  Other Topics Concern   Not on file  Social History Narrative   Lives in [place] with her sister. She works at [employer] living.    Drinks about 2 caffeine drinks a week    Social Drivers of Health   Tobacco Use: Low Risk (03/01/2024)   Patient History    Smoking Tobacco Use: Never    Smokeless Tobacco Use: Never    Passive Exposure: Not on file  Financial Resource Strain: Not on file  Food Insecurity: Not on file  Transportation Needs: Not on file  Physical Activity: Not on file  Stress: Not on file  Social Connections: Not on file  Intimate Partner Violence: Not on file  Depression (PHQ2-9): Low Risk (03/01/2024)   Depression (PHQ2-9)    PHQ-2 Score: 3  Alcohol Screen: Not on file  Housing: Not on file  Utilities: Not on file  Health Literacy: Not on file      PHYSICAL EXAM  There were no vitals filed for this visit.     There is no height or weight on file to calculate BMI.  Generalized: Well developed, in no acute distress  Cardiology: normal rate and rhythm, systolic murmur auscultated  Respiratory: clear to auscultation bilaterally  Respiratory: Clear to auscultation bilaterally Neurological examination  Mentation: Alert oriented to time, place, history taking. Follows all commands speech and language fluent Cranial nerve II-XII: Pupils were equal round reactive to light. Extraocular movements were full, visual field were full on confrontational test. Facial sensation and strength were normal. Head turning and shoulder shrug  were normal and symmetric. Motor: The motor testing reveals 5 over 5 strength of all 4 extremities. Good symmetric motor tone is noted throughout.  Gait and station:  Gait is normal.  DIAGNOSTIC DATA (LABS, IMAGING, TESTING) - I reviewed  patient records, labs, notes, testing and imaging myself where available.      No data to display           Lab Results  Component Value Date   WBC 6.5 03/01/2024   HGB 12.5 03/01/2024   HCT 36.9 03/01/2024   MCV 89.0 03/01/2024   PLT 230.0 03/01/2024      Component Value Date/Time   NA 140 03/01/2024 0914   NA 141 06/11/2021 1117   K 4.0 03/01/2024 0914   CL 103 03/01/2024 0914   CO2 30 03/01/2024 0914   GLUCOSE 101 (H) 03/01/2024 0914   BUN 13 03/01/2024 0914   BUN 11 06/11/2021 1117   CREATININE 0.85 03/01/2024 0914   CREATININE 0.85 05/10/2016 1101   CALCIUM  9.5 03/01/2024 0914   PROT 6.8 03/01/2024 0914   PROT 6.6 12/22/2017 0934   ALBUMIN 4.3 03/01/2024 0914   ALBUMIN 4.5 12/22/2017 0934   AST 16 03/01/2024 0914   ALT 14 03/01/2024 0914   ALKPHOS 48 03/01/2024 0914   BILITOT 0.6 03/01/2024 0914   BILITOT 0.5 12/22/2017 0934   GFRNONAA 65 06/01/2019 0736   GFRNONAA 81 05/10/2016 1101   GFRAA 74 06/01/2019 0736   GFRAA >89 05/10/2016 1101   Lab Results  Component Value Date   CHOL 175 12/02/2023   HDL 44.70 12/02/2023   LDLCALC 93 12/02/2023   TRIG 187.0 (H) 12/02/2023   CHOLHDL 4 12/02/2023   Lab Results  Component Value Date   HGBA1C 6.4 12/02/2023   Lab Results  Component Value Date   VITAMINB12 705 03/01/2024   Lab Results  Component Value Date   TSH 2.42 03/01/2024       ASSESSMENT AND PLAN 59 y.o. year old female  has a past medical history of Allergy, Diverticulitis, GERD (gastroesophageal reflux disease) (1993), Heart murmur, Hypertension (1993), Leaky heart valve, Sleep apnea, and Vitamin D  deficiency (2011). here with   No diagnosis found.   She continues to do well with CPAP therapy.  Compliance report reveals optimal compliance.  She is encouraged to continue using CPAP nightly and for greater than 4 hours each night. I have encouraged her to ask DME about need for [facility] card. She will continue close follow up with PCP and  cardiology. Well balanced diet and regular exercise encouraged. She will follow-up with me in 1 year.  She verbalizes understanding and agreement with this plan.  No orders of the defined types were placed in this encounter.    No orders of the defined types were placed in this encounter.      Greig Forbes, FNP-C 03/08/2024, 8:18 AM [facility] [address], [address] [place], [place] [address] [phone]

## 2024-03-09 ENCOUNTER — Ambulatory Visit: Admitting: Family Medicine

## 2024-03-09 ENCOUNTER — Encounter: Payer: Self-pay | Admitting: Family Medicine

## 2024-03-09 VITALS — BP 127/72 | HR 65 | Ht 62.0 in | Wt 238.2 lb

## 2024-03-09 DIAGNOSIS — G4733 Obstructive sleep apnea (adult) (pediatric): Secondary | ICD-10-CM

## 2024-03-12 ENCOUNTER — Telehealth: Payer: Self-pay

## 2024-03-28 NOTE — Progress Notes (Unsigned)
" ° °  Acute Office Visit  Subjective:     Patient ID: Amanda [name], female    DOB: [birth year], 58 y.o.   MRN: [id number]  No chief complaint on file.   HPI Patient is in today for acute visit. Presents with concerns for possible breast mass.   FH: Breast Cancer-sister  [facility] 07/2023 Mammogram: The asymmetry identified in the posterior depth of the RIGHT breast on the screening examination, corresponds to 0.8 cm oval circumscribed mass with a fatty hilum on today's diagnostic exam.  Targeted sonographic evaluation of the RIGHT breast demonstrates a nonenlarged lymph node with normal cortical thickness at 8 o'clock 16 CMFN, which corresponds to the mammographic mass at posterior depth.   IMPRESSION: Benign lymph node seen on ultrasound at 8 o'clock 16 CMFN corresponds to the mammographic mass seen in the posterior depth on MLO view. It does not require further dedicated follow-up.   [id number]  ROS  See HPI    Objective:    There were no vitals taken for this visit. {Vitals History (Optional):23777}  Physical Exam Vitals reviewed.  Constitutional:      General: She is not in acute distress.    Appearance: She is not toxic-appearing.  HENT:     Head: Normocephalic and atraumatic.     Mouth/Throat:     Mouth: Mucous membranes are moist.     Pharynx: Oropharynx is clear.  Eyes:     Extraocular Movements: Extraocular movements intact.     Pupils: Pupils are equal, round, and reactive to light.  Cardiovascular:     Rate and Rhythm: Normal rate and regular rhythm.     Pulses: Normal pulses.     Heart sounds: Normal heart sounds. No murmur heard. Pulmonary:     Effort: Pulmonary effort is normal. No respiratory distress.     Breath sounds: Normal breath sounds. No wheezing.  Musculoskeletal:        General: No swelling.     Cervical back: Neck supple.  Skin:    General: Skin is warm and dry.  Neurological:     General: No focal deficit present.     Mental  Status: She is alert and oriented to person, place, and time.  Psychiatric:        Mood and Affect: Mood normal.        Behavior: Behavior normal.        Thought Content: Thought content normal.        Judgment: Judgment normal.   Breast Exam: Breasts symmetrical, no masses, skin changes, nipple discharge, or tenderness noted bilaterally.  No skin dimpling, redness, or thickening noted; nipples without retraction or discharge. No axillary lymphadenopathy appreciated.     No results found for any visits on 03/30/24.      Assessment & Plan:   Problem List Items Addressed This Visit   None   No orders of the defined types were placed in this encounter.   No follow-ups on file.  Harlene LITTIE Jolly, NP   "

## 2024-03-30 ENCOUNTER — Encounter: Payer: Self-pay | Admitting: Student

## 2024-03-30 ENCOUNTER — Ambulatory Visit (INDEPENDENT_AMBULATORY_CARE_PROVIDER_SITE_OTHER): Admitting: Student

## 2024-03-30 VITALS — BP 132/81 | HR 97 | Temp 98.2°F | Resp 16 | Ht 62.0 in | Wt 238.0 lb

## 2024-03-30 DIAGNOSIS — L089 Local infection of the skin and subcutaneous tissue, unspecified: Secondary | ICD-10-CM | POA: Diagnosis not present

## 2024-03-30 MED ORDER — DOXYCYCLINE HYCLATE 100 MG PO TABS: 100.0000 mg | ORAL_TABLET | Freq: Two times a day (BID) | ORAL | 0 refills | Status: AC

## 2024-04-08 ENCOUNTER — Other Ambulatory Visit: Payer: Self-pay | Admitting: Cardiology

## 2024-04-08 DIAGNOSIS — I10 Essential (primary) hypertension: Secondary | ICD-10-CM

## 2024-06-01 ENCOUNTER — Ambulatory Visit: Admitting: Family Medicine
